# Patient Record
Sex: Male | Born: 1950 | Race: White | Hispanic: No | Marital: Married | Smoking: Former smoker
Health system: Southern US, Community
[De-identification: ages and names within clinical notes are randomized; demographics above are authoritative.]

## PROBLEM LIST (undated history)

## (undated) DIAGNOSIS — R531 Weakness: Secondary | ICD-10-CM

## (undated) DIAGNOSIS — M459 Ankylosing spondylitis of unspecified sites in spine: Secondary | ICD-10-CM

## (undated) DIAGNOSIS — I4891 Unspecified atrial fibrillation: Secondary | ICD-10-CM

## (undated) DIAGNOSIS — Z8709 Personal history of other diseases of the respiratory system: Secondary | ICD-10-CM

## (undated) DIAGNOSIS — J45909 Unspecified asthma, uncomplicated: Secondary | ICD-10-CM

## (undated) DIAGNOSIS — M2550 Pain in unspecified joint: Secondary | ICD-10-CM

## (undated) DIAGNOSIS — T783XXA Angioneurotic edema, initial encounter: Secondary | ICD-10-CM

## (undated) DIAGNOSIS — A6920 Lyme disease, unspecified: Secondary | ICD-10-CM

## (undated) DIAGNOSIS — M1990 Unspecified osteoarthritis, unspecified site: Secondary | ICD-10-CM

## (undated) DIAGNOSIS — M549 Dorsalgia, unspecified: Secondary | ICD-10-CM

## (undated) DIAGNOSIS — Z87442 Personal history of urinary calculi: Secondary | ICD-10-CM

## (undated) DIAGNOSIS — G8929 Other chronic pain: Secondary | ICD-10-CM

## (undated) DIAGNOSIS — M2540 Effusion, unspecified joint: Secondary | ICD-10-CM

## (undated) DIAGNOSIS — J189 Pneumonia, unspecified organism: Secondary | ICD-10-CM

## (undated) DIAGNOSIS — T4145XA Adverse effect of unspecified anesthetic, initial encounter: Secondary | ICD-10-CM

## (undated) DIAGNOSIS — Z8739 Personal history of other diseases of the musculoskeletal system and connective tissue: Secondary | ICD-10-CM

## (undated) DIAGNOSIS — T8859XA Other complications of anesthesia, initial encounter: Secondary | ICD-10-CM

## (undated) DIAGNOSIS — Z86010 Personal history of colonic polyps: Secondary | ICD-10-CM

## (undated) HISTORY — PX: OTHER SURGICAL HISTORY: SHX169

## (undated) HISTORY — PX: COLONOSCOPY: SHX174

## (undated) HISTORY — PX: APPENDECTOMY: SHX54

## (undated) HISTORY — PX: SHOULDER OPEN ROTATOR CUFF REPAIR: SHX2407

## (undated) HISTORY — PX: CHOLECYSTECTOMY: SHX55

## (undated) HISTORY — PX: TONSILLECTOMY: SUR1361

## (undated) HISTORY — PX: SEPTOPLASTY: SUR1290

## (undated) HISTORY — PX: ESOPHAGOGASTRODUODENOSCOPY: SHX1529

---

## 1999-04-30 ENCOUNTER — Encounter: Payer: Self-pay | Admitting: Emergency Medicine

## 1999-04-30 ENCOUNTER — Emergency Department (HOSPITAL_COMMUNITY): Admission: EM | Admit: 1999-04-30 | Discharge: 1999-04-30 | Payer: Self-pay | Admitting: Emergency Medicine

## 2000-02-25 ENCOUNTER — Encounter: Admission: RE | Admit: 2000-02-25 | Discharge: 2000-05-25 | Payer: Self-pay | Admitting: Anesthesiology

## 2000-05-02 ENCOUNTER — Emergency Department (HOSPITAL_COMMUNITY): Admission: EM | Admit: 2000-05-02 | Discharge: 2000-05-02 | Payer: Self-pay | Admitting: Emergency Medicine

## 2000-09-16 ENCOUNTER — Encounter: Payer: Self-pay | Admitting: Neurosurgery

## 2000-09-16 ENCOUNTER — Ambulatory Visit (HOSPITAL_COMMUNITY): Admission: RE | Admit: 2000-09-16 | Discharge: 2000-09-16 | Payer: Self-pay | Admitting: Neurosurgery

## 2000-10-11 ENCOUNTER — Ambulatory Visit (HOSPITAL_COMMUNITY): Admission: RE | Admit: 2000-10-11 | Discharge: 2000-10-11 | Payer: Self-pay | Admitting: Neurosurgery

## 2000-10-11 ENCOUNTER — Encounter: Payer: Self-pay | Admitting: Neurosurgery

## 2000-10-26 ENCOUNTER — Encounter: Admission: RE | Admit: 2000-10-26 | Discharge: 2000-10-26 | Payer: Self-pay | Admitting: Internal Medicine

## 2001-02-01 ENCOUNTER — Encounter (INDEPENDENT_AMBULATORY_CARE_PROVIDER_SITE_OTHER): Payer: Self-pay

## 2001-02-01 ENCOUNTER — Other Ambulatory Visit: Admission: RE | Admit: 2001-02-01 | Discharge: 2001-02-01 | Payer: Self-pay | Admitting: *Deleted

## 2002-03-19 ENCOUNTER — Encounter: Payer: Self-pay | Admitting: Emergency Medicine

## 2002-03-19 ENCOUNTER — Emergency Department (HOSPITAL_COMMUNITY): Admission: EM | Admit: 2002-03-19 | Discharge: 2002-03-19 | Payer: Self-pay | Admitting: Emergency Medicine

## 2004-08-13 ENCOUNTER — Encounter: Admission: RE | Admit: 2004-08-13 | Discharge: 2004-08-13 | Payer: Self-pay | Admitting: Ophthalmology

## 2005-09-28 ENCOUNTER — Ambulatory Visit: Payer: Self-pay | Admitting: Family Medicine

## 2006-01-14 ENCOUNTER — Ambulatory Visit: Payer: Self-pay | Admitting: Family Medicine

## 2006-02-05 ENCOUNTER — Ambulatory Visit (HOSPITAL_COMMUNITY): Admission: RE | Admit: 2006-02-05 | Discharge: 2006-02-05 | Payer: Self-pay | Admitting: Anesthesiology

## 2006-10-14 ENCOUNTER — Encounter: Admission: RE | Admit: 2006-10-14 | Discharge: 2006-10-14 | Payer: Self-pay | Admitting: Anesthesiology

## 2006-10-15 ENCOUNTER — Encounter: Admission: RE | Admit: 2006-10-15 | Discharge: 2006-10-15 | Payer: Self-pay | Admitting: Anesthesiology

## 2006-10-26 ENCOUNTER — Ambulatory Visit: Payer: Self-pay | Admitting: Internal Medicine

## 2006-10-28 ENCOUNTER — Ambulatory Visit (HOSPITAL_COMMUNITY): Admission: RE | Admit: 2006-10-28 | Discharge: 2006-10-28 | Payer: Self-pay | Admitting: Gastroenterology

## 2006-10-31 ENCOUNTER — Emergency Department (HOSPITAL_COMMUNITY): Admission: EM | Admit: 2006-10-31 | Discharge: 2006-10-31 | Payer: Self-pay | Admitting: Emergency Medicine

## 2006-11-04 ENCOUNTER — Ambulatory Visit: Payer: Self-pay | Admitting: Gastroenterology

## 2006-11-18 ENCOUNTER — Ambulatory Visit: Payer: Self-pay | Admitting: Family Medicine

## 2006-11-26 ENCOUNTER — Ambulatory Visit: Payer: Self-pay | Admitting: Internal Medicine

## 2006-11-29 ENCOUNTER — Inpatient Hospital Stay (HOSPITAL_COMMUNITY): Admission: RE | Admit: 2006-11-29 | Discharge: 2006-12-03 | Payer: Self-pay | Admitting: Surgery

## 2006-11-29 ENCOUNTER — Encounter (INDEPENDENT_AMBULATORY_CARE_PROVIDER_SITE_OTHER): Payer: Self-pay | Admitting: Specialist

## 2006-12-07 ENCOUNTER — Ambulatory Visit: Payer: Self-pay | Admitting: Gastroenterology

## 2006-12-20 ENCOUNTER — Emergency Department (HOSPITAL_COMMUNITY): Admission: EM | Admit: 2006-12-20 | Discharge: 2006-12-20 | Payer: Self-pay | Admitting: Emergency Medicine

## 2007-03-22 ENCOUNTER — Ambulatory Visit (HOSPITAL_COMMUNITY): Admission: RE | Admit: 2007-03-22 | Discharge: 2007-03-22 | Payer: Self-pay | Admitting: Anesthesiology

## 2007-07-06 IMAGING — CR DG ABDOMEN 1V
1 series · 1 of 1 positions shown · non-contrast
Comparison: none

CLINICAL DATA: Bile leak.  Evaluate for stent position.  
 ABDOMEN  - 1 VIEW:

[t abdomen supine]
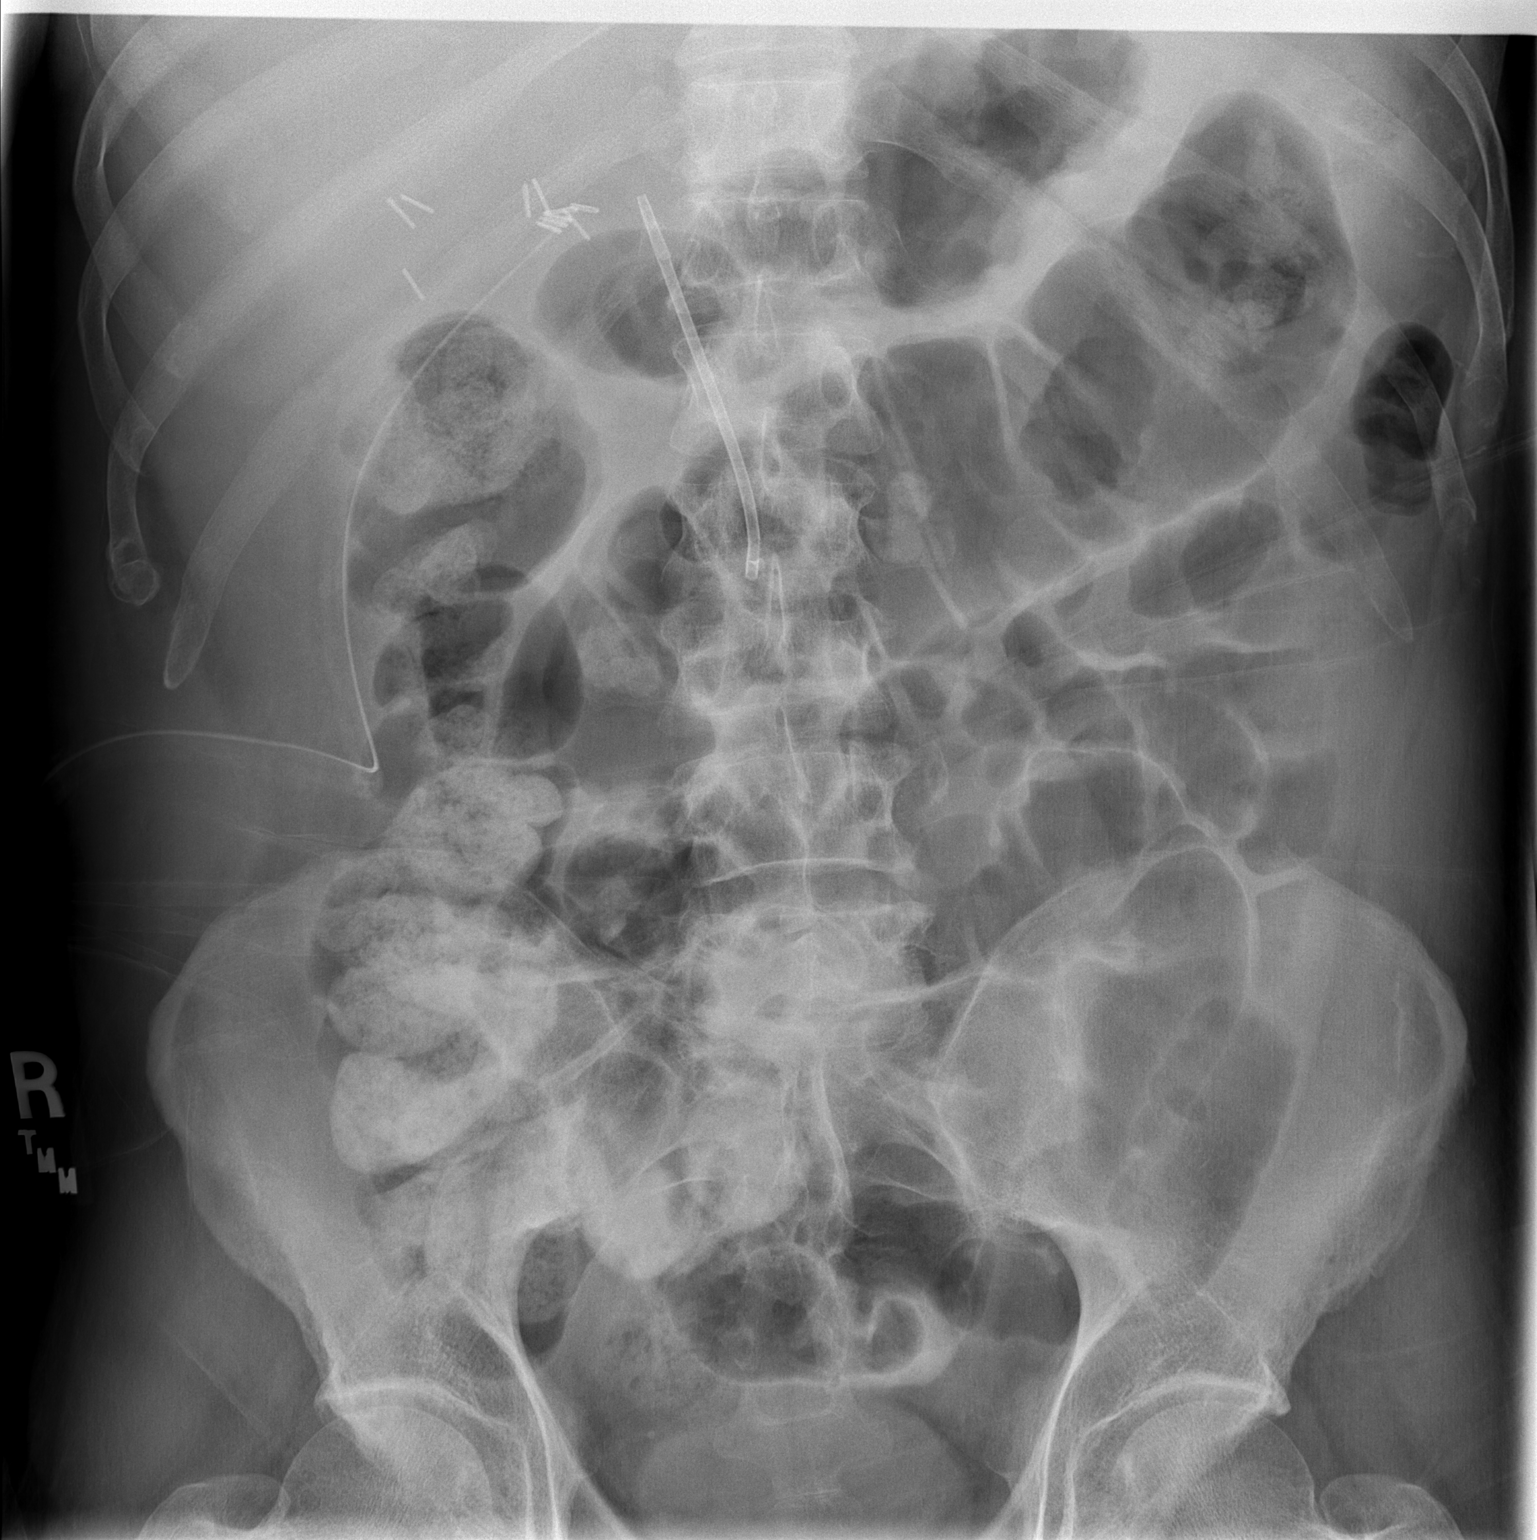

[1 of 1 positions shown; findings below may reference images not displayed]

FINDINGS: Plastic stent appears to be in good position in the common bile duct.  There is also a subhepatic drain on the right and clips present in the gallbladder fossa.  Patient has an ileus with mild dilatation of large and small bowel. 
 The findings were discussed with Dr. Ceejay at [DATE] hours on 12/02/06.
IMPRESSION: Mild ileus. 
 Common bile duct stent appears to be appropriately positioned.

## 2007-07-07 IMAGING — CR DG CHEST 1V PORT
1 series · 1 of 1 positions shown · non-contrast
Comparison: 11/26/06.

CLINICAL DATA: 56 year old male; status post cholecystectomy.  PICC line insertion.
PORTABLE CHEST -  SINGLE VIEW - 12/03/06:

[view not recorded]
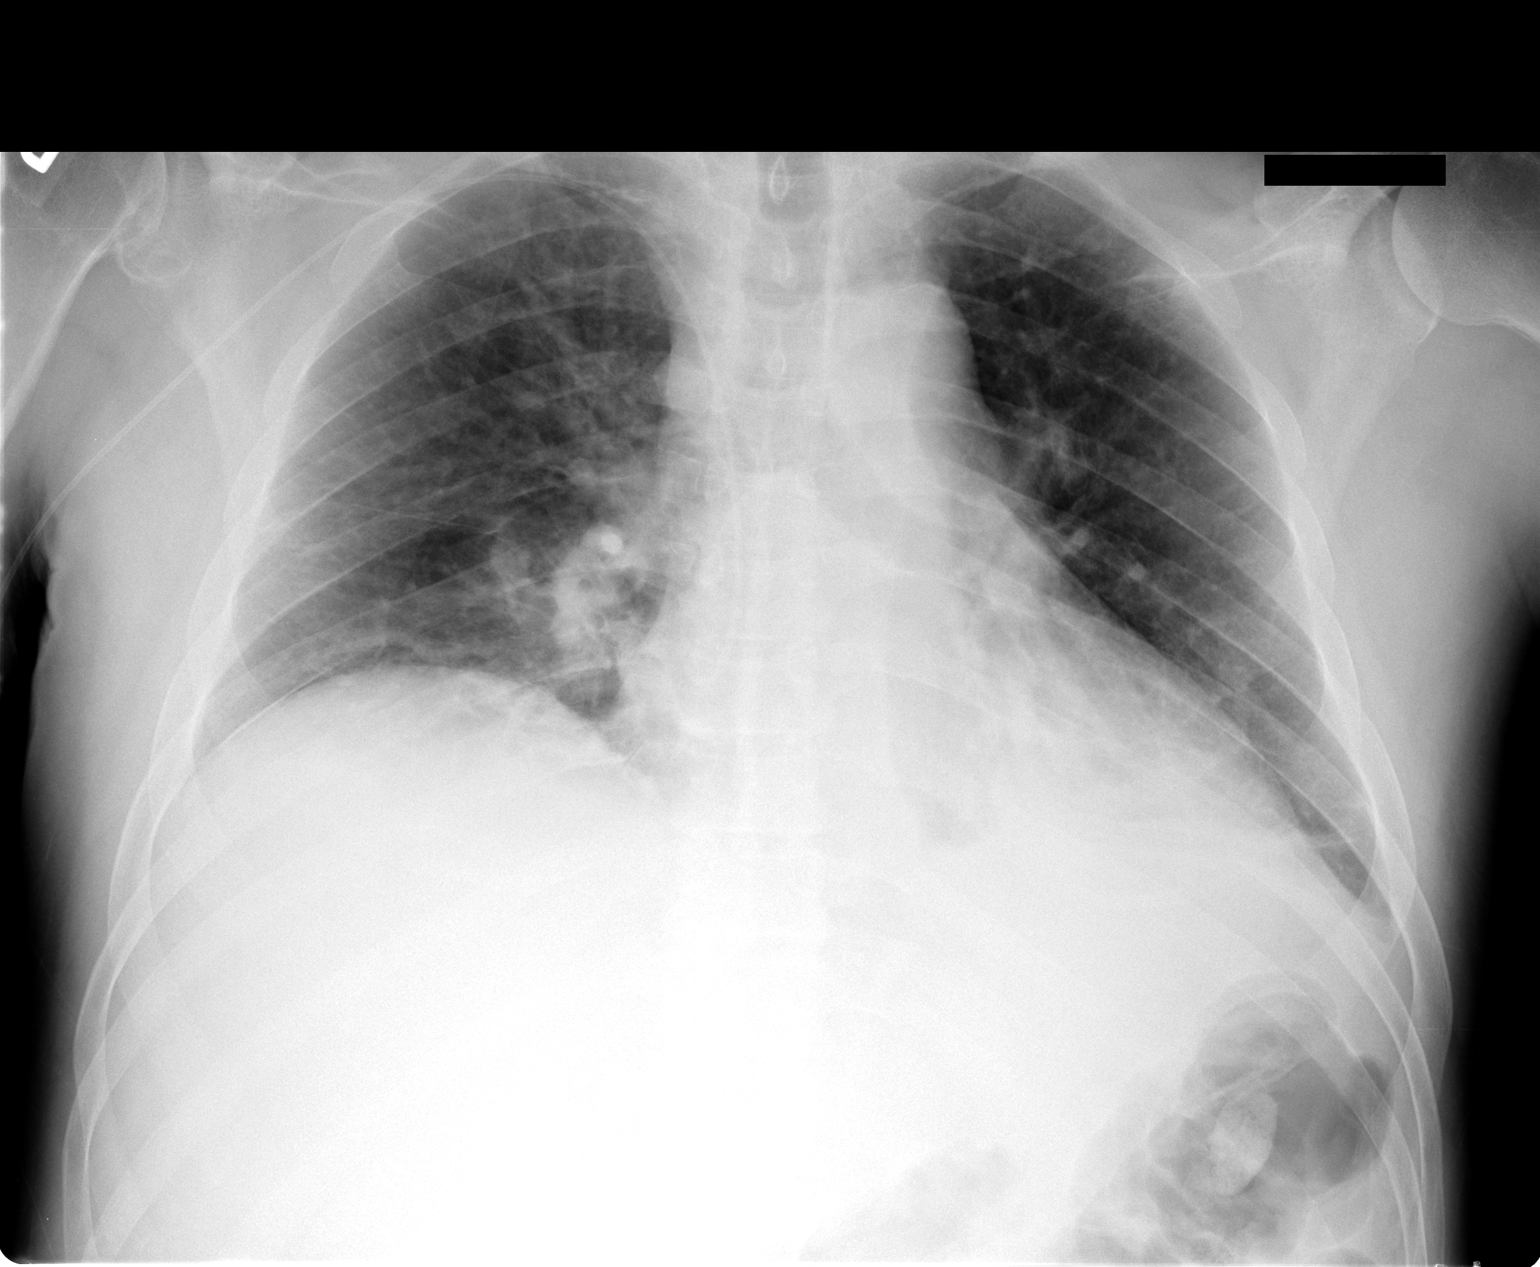

[1 of 1 positions shown; findings below may reference images not displayed]

FINDINGS: ight PICC is in the mid-RA.  This can be retracted 2 cm to the SVC-RA junction.  Low lung volumes with bibasilar atelectasis versus air space disease.  Lower lobe pneumonia is not excluded.  No large effusion or pneumothorax.
IMPRESSION: 1.  Right PICC line tip in mid-RA.  This can be retracted 2 cm.
2.  Low volume exam with bibasilar atelectasis and air space disease.

## 2007-09-10 ENCOUNTER — Ambulatory Visit: Payer: Self-pay | Admitting: Family Medicine

## 2008-03-09 ENCOUNTER — Encounter: Payer: Self-pay | Admitting: Internal Medicine

## 2008-11-01 ENCOUNTER — Encounter (INDEPENDENT_AMBULATORY_CARE_PROVIDER_SITE_OTHER): Payer: Self-pay | Admitting: Orthopedic Surgery

## 2008-11-01 ENCOUNTER — Ambulatory Visit (HOSPITAL_BASED_OUTPATIENT_CLINIC_OR_DEPARTMENT_OTHER): Admission: RE | Admit: 2008-11-01 | Discharge: 2008-11-01 | Payer: Self-pay | Admitting: Orthopedic Surgery

## 2010-08-22 ENCOUNTER — Encounter
Admission: RE | Admit: 2010-08-22 | Discharge: 2010-08-22 | Payer: Self-pay | Source: Home / Self Care | Attending: Anesthesiology | Admitting: Anesthesiology

## 2010-09-16 ENCOUNTER — Ambulatory Visit: Payer: 59 | Attending: Neurosurgery | Admitting: Rehabilitative and Restorative Service Providers"

## 2010-09-16 DIAGNOSIS — M542 Cervicalgia: Secondary | ICD-10-CM | POA: Insufficient documentation

## 2010-09-16 DIAGNOSIS — M2560 Stiffness of unspecified joint, not elsewhere classified: Secondary | ICD-10-CM | POA: Insufficient documentation

## 2010-09-16 DIAGNOSIS — M25519 Pain in unspecified shoulder: Secondary | ICD-10-CM | POA: Insufficient documentation

## 2010-09-16 DIAGNOSIS — IMO0001 Reserved for inherently not codable concepts without codable children: Secondary | ICD-10-CM | POA: Insufficient documentation

## 2010-09-22 ENCOUNTER — Ambulatory Visit: Payer: 59 | Admitting: Rehabilitative and Restorative Service Providers"

## 2010-09-24 ENCOUNTER — Ambulatory Visit: Payer: 59 | Admitting: Rehabilitative and Restorative Service Providers"

## 2010-09-24 ENCOUNTER — Ambulatory Visit: Payer: 59 | Admitting: Physical Therapy

## 2010-09-29 ENCOUNTER — Encounter: Payer: 59 | Admitting: Rehabilitative and Restorative Service Providers"

## 2010-11-20 LAB — CULTURE, ROUTINE-ABSCESS

## 2010-11-20 LAB — ANAEROBIC CULTURE

## 2010-12-23 NOTE — Op Note (Signed)
NAME:  Justin [name], Justin [name]              ACCOUNT NO.:  [id number]   MEDICAL RECORD NO.:  [id number]          PATIENT TYPE:  AMB   LOCATION:  DSC                          FACILITY:  [facility]   PHYSICIAN:  Cindee Salt, M.D.       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   POSTOPERATIVE DIAGNOSIS:  Probable chronic paronychia, left index  finger.   OPERATION:  Excision and removal of nail plate with removal of probable  sinus tract, left index finger.   SURGEON:  Cindee Salt, MD   ASSISTANT:  Joaquin Courts, RN   ANESTHESIA:  Forearm IV regional along with local supplementation with  0.25% Marcaine without epinephrine.   ANESTHESIOLOGIST:  Zenon Mayo, MD.   HISTORY:  The patient is a 60 year old male with history of a paronychia  and he has undergone incision and drainage.  This has not responded.  He  has no history of injury to it.  He is complaining of pain and  discomfort.  Examination reveals that he has swelling of the paronychial  area of his left index finger and irregularity of the radial aspect of  the nail matrix.  He is aware of risks and complications including  infection, recurrence, continuation of problems, and irregularity of the  nail bed.  He has elected to proceed to have this treated with removal  of the nail plate and incision and drainage as necessary.  In the  preoperative area, the patient is seen, the extremity marked by both the  patient and surgeon, and antibiotic was not given.   PROCEDURE IN DETAIL:  He was taken to the operating room where a forearm  based IV regional anesthetic was carried out without difficulty.  He was  prepped using ChloraPrep, supine position with his left arm free.  Time-  out was taken.  Filling was still of present on his finger.  A digital  block was given with 0.25%  Marcaine without epinephrine, approximately 6  mL was used.  Following adequate anesthesia, the nail plate was removed  with a periosteal elevator.  This allowed visualization of a  irregularity on the radial aspect of his nail fold.  This was removed  and the sinus tract was immediately removed along with the skin area  which was irregular.  This was followed back to the proximal nail fold.  This left a tract of nail plate which was adherent dorsally and  palmarly.  This was excised.  Small incision was made proximally in the  dorsal nail fold.  We were certain that there was not a collection of  purulent material proximally and cultures were taken for both aerobic  and anaerobic cultures and the area irrigated and nonadherent gauze was  placed into the nail fold.  Sterile compressive dressing and splint  applied  to the finger.  The patient tolerated the procedure well and was taken  to the recovery room for observation in satisfactory condition.  He will  be discharged to home to return to the [facility] [place] in 1  week on Vicodin and Septra DS.           ______________________________  Cindee Salt, M.D.     GK/MEDQ  D:  11/01/2008  T:  11/02/2008  Job:  [id number]   cc:   Dyke Brackett, M.D.

## 2010-12-26 NOTE — Discharge Summary (Signed)
NAME:  Justin [name], Justin [name]              ACCOUNT NO.:  [id number]   MEDICAL RECORD NO.:  [id number]          PATIENT TYPE:  INP   LOCATION:  5736                         FACILITY:  [facility]   PHYSICIAN:  Currie Paris, M.D.DATE OF BIRTH:  1951-03-16   DATE OF ADMISSION:  11/29/2006  DATE OF DISCHARGE:  12/03/2006                               DISCHARGE SUMMARY   OFFICE MEDICAL RECORD NUMBER [id number].   FINAL DIAGNOSES:  1. Chronic calculous cholecystitis.  2. Choledocholithiasis.  3. 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  4. Occluded common bile duct stent.  5. Ankylosing spondylitis.   CLINICAL HISTORY:  Justin [name] is a 60 year old gentleman who has  chronic back pain and ankylosing spondylitis on chronic OxyContin  therapy for pain management.  He has had several episodes of epigastric  and right upper quadrant pain.  His workup showed gallstones.  There is  some question of a prominent common duct on his initial ultrasound and  CT.  Preoperative evaluation and consultation with GI was also obtained.  An endoscopic ultrasound showed apparently no evidence of a common duct  stone and no ampullary mass, but a large duodenal diverticulum.  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  Because of  his gallbladder symptoms he was admitted for cholecystectomy.   HOSPITAL COURSE:  The patient was admitted and taken to the operating  room where a laparoscopic cholecystectomy and operative cholangiogram  were performed.  At the time of surgery he was noted to have a tiny duct  of Luschka which was cauterized and clipped.  We tried to sealed this  with some Flo-Seal.  In addition, his cholangiogram showed common duct  stones.  The evening after surgery he had a fair amount of pain.  It was  unclear whether this was simply related to the fact that he was already  on a lot of OxyContin and required  increased pain meds.  His JP drainage  was initially clear but then by the next morning had some bile staining.   Because of his finding of common duct stones he was seen again by GI  consultation and ERCP scheduled and performed on April 22.  A stent was  placed and at least one common duct stone was obtained.   Following the ERCP he had marked increase of his drainage of pure bile  from the Jackson-Pratt.  He had low grade fevers and elevated white  counts.  He continued to have a lot of pain and abdominal tenderness but  was able to be managed with pain medication.  By April 23 he felt a  little bit better.  We started him on some Lovenox as DVT prophylaxis.  He continued high drain output and culture showed some negative rods, so  antibiotics were continued.  Because he is continuing to leak and after  discussion with GI we obtained another CT scan.  This showed a small  collection around the area of the bed of the gallbladder but  it appeared  that the drain was in good position.  On the ERCP it did look like he  was leaking from the area of the duct of Luschka.  On April 25, because  it was clear we are not going to be able to feed him much and we thought  he was starting to get close to malnourished, we decided to place a PICC  and think about TNA.  After further discussion with GI it was felt that  stent replacement was needed.  Dr. Russella Dar believed that this would be  best done at [facility] because we had so much difficulty with the very  first  ERCP given the anatomical difficulties provided by the duodenal  diverticulum which was right in the area of the common duct.  Therefore,  on April 25 the patient was transferred to [facility].  He will be followed up at that point.      Currie Paris, M.D.  Electronically Signed     CJS/MEDQ  D:  01/05/2007  T:  01/05/2007  Job:  [id number]   cc:   Venita Lick. Russella Dar, MD, Clementeen Graham  Mark L. Vear Clock, M.D.

## 2010-12-26 NOTE — Procedures (Signed)
[facility]  Patient:    Justin [name], Justin [name]                     MRN: [id number] Proc. Date: 02/26/00 Adm. Date:  [id number] Attending:  Thyra Breed CC:         Julio Sicks, M.D.                           Procedure Report  PROCEDURE:  Lumbar epidural steroid injection.  DIAGNOSIS:  Lumbar degenerative disk disease at multiple levels on MRI and radiculopathy into the left lower extremity.  HISTORY OF PRESENT ILLNESS:  The patient states he was in his usual state of health up until about 6-8 weeks ago when he developed low back pain radiating out into his left lower extremity. It began with an injury in his garden. Initially seen by Dr. Jonny Ruiz and referred to Dr. Jordan Likes. An MRI was performed on Dec 29, 1999 which showed generalized degenerative disk disease with mild spondylosis and mild posterior bulging. He was given a steroid Dosepak and did not improve. He is sent for epidural steroid injections today. He notes that his back pain is beginning to spread over to the right side. He described it as a sharp pain. It is made worse by sitting, standing for long periods and improved by laying down. He denied any bowel or bladder incontinence but has had some left lower extremity weakness and does have numbness down the posterior aspect down the left thigh and leg. He has been treated with OxyContin which does help but he takes this on a p.r.n. basis.  CURRENT MEDICATIONS:  OxyContin 20 mg 1 p.o. b.i.d. p.r.n., Serevent and Flovent.  ALLERGIES:  Indocin.  FAMILY HISTORY:  Positive for leukemia.  PAST SURGICAL HISTORY:  Significant for left shoulder surgery and appendectomy.  SOCIAL HISTORY:  The patient is a 1 cigar smoker per day and 1 beer per day. He works for [employer], forensic as a Production designer, theatre/television/film.  ACTIVE MEDICAL PROBLEMS:  Asthma.  REVIEW OF SYSTEMS:  GENERAL:  Negative. HEAD:  Negative.  EYES:  Negative. NOSE/MOUTH/THROAT:  Negative. EAR:  Negative. PULMONARY:  Significant for asthma. CARDIOVASCULAR:  Negative.  GI: Negative. GU:  Negative.  MUSCULOSKELETAL/NEUROLOGIC:  See HPI. HEMATOLOGIC:  Negative. ENDOCRINE: Negative. CUTANEOUS:  Negative. PSYCHIATRIC:  Negative. ALLERGY/IMMUNOLOGIC:  Negative.  PHYSICAL EXAMINATION:  VITAL SIGNS:  Blood pressure 139/88, heart rate 68, respiratory rate 10, O2 saturations 98%, pain level 5/10 and temperature is 97.7.  GENERAL:  This is a pleasant male in no acute distress.  HEENT:  Head was normocephalic, atraumatic. Eyes, extraocular movements intact. Conjunctivae and sclerae clear. Nose patent nares without discharge. Oropharynx was free of lesions.  NECK:  Supple without lymphadenopathy.  LUNGS:  Clear to auscultation and percussion.  ABDOMEN:  Bowel sounds present without appreciable masses or organomegaly.  CARDIOVASCULAR:  Heart was regular rate and rhythm without murmur, gallop or rub.  GENITALIA/RECTAL:  Not performed.  BACK:  Revealed positive straight leg raise sign on the left side with no tenderness to percussion over the vertebra.  EXTREMITIES:  No cyanosis, clubbing or edema with prominent varicosities over the right lower extremity.  Radial pulses and dorsalis pedis pulse were 2+ and symmetric.  NEUROLOGIC:  The patient was oriented x 4. Cranial nerves II-XII were grossly intact. Deep tendon reflexes were symmetric in the upper and lower extremities with downgoing toes. Motor was significant for weakness of the  left dorsiflexion of the great toe and foot. Coordination was grossly intact.  IMPRESSION: 1. Low back pain with radiation out into the left lower extremity and possibly    an L5 radiculopathy. 2. Asthma.  DISPOSITION:  I discussed the potential risks, benefits and limitations of a lumbar epidural steroid injection in detail with the patient as well as the side effects of corticosteroids. He wishes to proceed with this.  DESCRIPTION OF PROCEDURE:  After informed  consent was obtained, the patient was placed in the sitting position and monitored. The patients back was prepped with Betadine x 3. A skin wheal was raised at the L4-5 interspace with 1 percent lidocaine. A 20 gauge Tuohy needle was introduced to the lumbar epidural space to loss of resistance to preservative free normal saline. There was no cerebrospinal fluid nor blood. 120 mg of Medrol and 10 ml of preservative free normal saline was gently injected. The needle was flushed with preservative free normal saline and removed intact.  CONDITION POST PROCEDURE:  Stable.  DISCHARGE INSTRUCTIONS:  Resume previous diet. Limitations in activities per instruction sheet. Continue on current medications. Follow-up with me next week for a repeat injection. DD:  02/26/00 TD:  03/01/00 Job: [id number]

## 2010-12-26 NOTE — H&P (Signed)
[facility]  Patient:    Justin [name], Justin [name]                     MRN: [id number] Proc. Date: 03/01/00 Adm. Date:  [id number] Attending:  Thyra Breed CC:         Julio Sicks, M.D.                         History and Physical  FOLLOWUP EVALUATION  Justin [name] called on Friday stating that his back was hurting him worse after the epidural steroid injection, but it was different from his previous pain.  He presents today because the hydrocodone did not help his pain.  He tells me he does not feel that his current pain is related to the injection.  It feels more like a muscular spasm in his back and the lower left side of his back is bothering him to a greater extent.  It is made worse by hyperextending his back.  He has been sleeping in a recliner.  He continues on his OxyContin which he is taking regularly now.  He rates his pain at 9/10.  PHYSICAL EXAMINATION:  VITAL SIGNS:  Blood pressure 146/97, heart rate 92, respiratory rate 18, O2 saturation 96%, pain level 9 out of 10, temperature 98.1.  NEUROLOGIC:  Straight leg raise signs are negative.  Deep tendon reflexes are unchanged from before.  He has tenderness over the left lower facet joints. Hyperextension of his back increases his discomfort.  Forward flexion reduces this discomfort.  IMPRESSION:  Exacerbation of facet joint arthritis with underlying lumbar radiculopathy and degenerative disk disease.  DISPOSITION: 1. Percocet 5/325 one to two p.o. q.6h. p.r.n. #50 with no refill. 2. Valium 5 mg one p.o. q.6h. p.r.n. #50 with no refill. 3. Remain off of work at least another day and gradually taper back on his    Valium and Percocet. 4. Followup with me in one week as previously scheduled.  He is to call in a    couple of days to let us know how he is doing, even if he is doing better.    He does not feel his current problem is related to the injection itself. DD:  03/01/00 TD:  03/02/00 Job:  [id number]

## 2010-12-26 NOTE — Procedures (Signed)
[facility]  Patient:    Justin [name], Justin [name]                     MRN: [id number] Proc. Date: 04/09/00 Adm. Date:  [id number] Attending:  Thyra Breed CC:         Julio Sicks, M.D.                           Procedure Report  PROCEDURE:  Lumbar epidural steroid injection.  DIAGNOSIS:  Lumbar radiculopathy with underlying degenerative disk disease.  INTERVAL HISTORY:  The patient has noted that his radiculopathy is markedly improved.  He delayed this injection because he had a lot of back discomfort following his last injection.  He has consistently had flare-ups of lower back discomfort with each injection which have required short courses of Valium and Percocet.  In anticipation of this, I have gone ahead and written him a prescription for Valium 5 mg 1 p.o. q.6h. p.r.n. #50 with no refill and Percocet 5/325, 1-2 p.o. q.6h. p.r.n. pain #60 with no refill.  PHYSICAL EXAMINATION:  Blood pressure 146/89, heart rate 92, respiratory rate 16, O2 saturations 98%.  Temperature is 97.1.  Pain level is 8/10.  Neuro exam is grossly unchanged.  He is in good spirits.  DESCRIPTION OF PROCEDURE:  After informed consent was obtained, the patient was placed in the sitting position and monitored.  His back was prepped with Betadine x 3.  A skin wheal was raised at the L4-L5 interspace with 1% lidocaine.  A 20 gauge Tuohy needle was introduced to the lumbar epidural space to loss of resistance to preservative-free normal saline.  There was no CSF nor blood.  Medrol 80 mg in 10 mL of preservative-free normal saline was gently injected.  The needle was flushed with preservative-free normal saline and removed intact.  Postprocedure condition - stable.  DISCHARGE INSTRUCTIONS: 1. Resume previous diet. 2. Limitations on activities per instruction sheet. 3. Medications as per discussion above. 4. Follow up with Dr. Jordan Likes, as previously arranged. DD:  04/09/00 TD:   04/11/00 Justin [name]: [id number]

## 2010-12-26 NOTE — Assessment & Plan Note (Signed)
[facility]                             PULMONARY OFFICE NOTE   NAME:Jabbour, TAVI HOOGENDOORN                     MRN:          [id number]  DATE:11/26/2006                            DOB:          03-15-1951    HISTORY:  A 61 year old white male who has not been seen here in more  than 5 years. The story is that he quit smoking 8 years ago at 240  pounds and gradually improved and then 6 months later states he began  having problems with daily symptoms of coughing and wheezing which  were better actually after sinus surgery 6 years ago, and then had  complete relief of all respiratory complaints except shortness of breath  with heavy exertion which was relieved with albuterol.  Two years ago he  had a change in the pattern of his dyspnea and began having daily  symptoms for which he was treated with Serevent and Pulmicort twice  daily and also maintained on Nexium.  However, he states that he was  never good on this regimen with frequent exacerbations and daily  symptoms again that required frequent albuterol use.  This resulted in a  trip to the emergency room at [place], and was given a course of  prednisone which he finished a week ago and now is feeling better.  He  denies any ongoing sinus or reflux symptoms, fevers, chills, sweats,  pleuritic pain, orthopnea, PND or leg swelling or need for albuterol.   PAST MEDICAL HISTORY:  Significant for sinus disease and asthma/COPD as  outlined above.  Cholelithiasis. He has chronic cholecystitis. He is scheduled for  cholecystectomy on November 29, 2006.   ALLERGIES:  None known.   MEDICATIONS:  He initially just listed Serevent and Nexium as  maintenance medicines along with p.r.n. albuterol. I had to ask him  about Pulmicort which was bothersome in that he said, oh yeah, that  too but denied any non adherence issues.   SOCIAL HISTORY:  He quit smoking as noted above 8 years ago. 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   FAMILY HISTORY:  Recorded in detail in the work sheet and negative for  respiratory disease.   REVIEW OF SYSTEMS:  Also recorded in detail on the work sheet and  negative except as outlined above.   PHYSICAL EXAMINATION:  GENERAL:  This is a pleasant ambulatory white  male in no acute distress.  VITAL SIGNS:  He is afebrile with normal vital signs.  HEENT:  Reveals mild to moderate turbinates with nonspecific features.  Oropharynx is clear. Dentition is intact. Ear canals are clear  bilaterally.  NECK:  Supple without cervical adenopathy or tenderness.  LUNGS:  Perfectly clear bilaterally to auscultation and percussion.  HEART:  Regular rhythm without murmurs, rubs, or gallops. Hoover sign  was negative.  ABDOMEN:  Soft, benign with no palpable organomegaly, masses or  tenderness.  EXTREMITIES:  Warm without calf tenderness. No clubbing, cyanosis  or  edema.   Chest x-ray was reviewed from his ER visit on October 31, 2006 and  indicated slight increased bronchial markings, and 6% eosinophils on  peripheral blood smear.   IMPRESSION:  Chronic asthma with recent acute exacerbation now back to  baseline.  I strongly suspect this patient is not adherent with  Pulmicort by the fact that he did not mention he was on it until I  extracted the information from him. This is the reason I no longer  prescribe Serevent in asthmatics (they tend to refill Serevent, because  it addresses their symptoms, but forget about the Pulmicort, which  addresses the underlying cause).   A reasonable compromise in this type patient is to switch him to  combination products and since he prefers MDI, I have recommended  Symbicort 160/4.5 2 puffs b.i.d. perfectly regularly.  Return here for  followup PFTs in 6-8 weeks to confirm that the asthmatic component has  been  eliminated and to get a firm baseline before switching to the  lower dose of Symbicort.   In the meantime I have reviewed with him the rule of two's for albuterol  use, emphasizing that albuterol is only for symptoms and serves as a  marker to how poorly controlled his asthma is, that is if he is having  to use it more than twice weekly his asthma is not under good control.   In terms of surgery, he certainly is at low risk for cholecystectomy and  I have recommended proceeding with this.     Charlaine Dalton. Sherene Sires, MD, [facility]  Electronically Signed    MBW/MedQ  DD: 11/26/2006  DT: 11/26/2006  Job #: [id number]   cc:   Arta Silence, MD

## 2010-12-26 NOTE — Assessment & Plan Note (Signed)
 [facility]ALTHCARE                         GASTROENTEROLOGY OFFICE NOTE   NAME:[name], Justin KLIPPEL                     MRN:          [id number]  DATE:10/26/2006                            DOB:          Aug 18, 1950    ADDENDUM   Dr. 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  He certainly could have another  carcinoid at the ampulla i.e., or a neuroendocrine in that region.     Iva Boop, MD,FACG     CEG/MedQ  DD: 10/26/2006  DT: 10/27/2006  Job #: [id number]   cc:   Currie Paris, M.D.  Dr. Vear Clock

## 2010-12-26 NOTE — Op Note (Signed)
NAME:  JACKOB, CROOKSTON              ACCOUNT NO.:  [id number]   MEDICAL RECORD NO.:  [id number]          PATIENT TYPE:  OIB   LOCATION:  5736                         FACILITY:  [facility]   PHYSICIAN:  Currie Paris, M.D.DATE OF BIRTH:  01-24-1951   DATE OF PROCEDURE:  11/29/2006  DATE OF DISCHARGE:                               OPERATIVE REPORT   OFFICE MEDICAL RECORD NUMBER CCS   PREOPERATIVE DIAGNOSIS:  Cholelithiasis.   POSTOPERATIVE DIAGNOSIS:  Cholelithiasis, choledocholithiasis and  congenital duct of Luschka.   OPERATION:  Laparoscopic cholecystectomy with operative cholangiogram,  placement of Blake drain.   SURGEON:  Currie Paris, M.D.   ASSISTANT:  Rose Phi. Maple Hudson, M.D.   ANESTHESIA:  General endotracheal.   CLINICAL HISTORY:  Mr. Mcdanel is a 60 year old gentleman who has  develop biliary type symptoms a with a background of chronic pain being  managed by Dr. Thyra Breed.  He takes 60 mg of Oxycontin b.i.d. for  his pain.  However, he has developed some new right upper quadrant  symptoms.  Preoperative evaluation showed what looked like a common duct  that was dilated as well as pancreatic duct dilation.  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  He was then scheduled for cholecystectomy.   DESCRIPTION OF PROCEDURE:  The patient seen in the holding area and he  had no further questions.  He is taken to the operating room, after  satisfactory general endotracheal anesthesia obtained the abdomen was  prepped and draped.  The time-out occurred.   0.25% plain Marcaine was used for each incision.  The umbilical incision  was made, the fascia identified and opened and the peritoneal cavity  entered under direct vision.  The Hasson was placed, the abdomen  insufflated to 15.   The patient in reverse Trendelenburg and tilted to the left, a 10-11  trocar was placed in the epigastrium and  two 5 mm laterally.  There no  gross abnormalities noted when the scope was placed.   The gallbladder retracted over the liver.  The peritoneum over the  cystic duct opened and I made a nice window and could identify the  cystic duct and the main cystic artery.  Lying right on the cystic duct  was also tiny branch was able to dissect off and clip and divide.  We  had a nice window so I was confident of the anatomy.  The common duct  looked dilated.   One clip was placed on the cystic artery and another of the cystic duct  near the gallbladder.  Cystic duct was opened and a Cook catheter  introduced and operative cholangiography done doing two runs.  First run  showed a dilated duct.  We could not get a good look at the distal duct  because we were centered a little bit high.  There was a possible distal  filling defect and the proximal radicals filled.  The second run was  focused lower down and we did see slow entry of the contrast into the  duodenum with what looked like a strictured duct but also looked like a  couple of stones.  I had the radiologist review these and he called back  and concurred that there appeared to be some stone debris near the end  of the common bile duct.   With that report, I went ahead and then put three clips on the cystic  duct and divided that.  The cystic arteries dissected out a little  better and clipped and divided.  The gallbladder was removed from below  to above with coagulation current of the cautery.  A small clip was  placed on what looked like a vessel along the posterior edge of the  gallbladder about a third of the way up.  Just prior to disconnecting  the gallbladder, we stopped to irrigate.  There had been a little oozing  a couple places on the gallbladder bed which were coagulated.  However,  I noted one tiny drop of the bile staining the edge of the gallbladder  bed fairly low down and more on the medial aspect.  I thought this   represented a duct of Luschka.  It was not actively spilling bile.   I was able to put two clips right on this which I think occluded it.  We  irrigated and saw no further leakage of bile.   The gallbladder was disconnected and brought out the umbilical port.  I  replaced the umbilical port and placed a 19 Blake drain and brought out  the lateral port site.  This was placed along the base of the  gallbladder bed.  I then put 5 mL of FloSeal directly over the base of  the gallbladder right where the duct of Luschka and where there had been  a little oozing to make sure everything was dry and see if this would  help seal the duct.   I suctioned out any remaining irrigant.  The abdomen was deflated. The  lateral port remaining was pulled.  The umbilical port was removed and  the pursestring tied down to close that.  With the camera in the  epigastric port made sure nothing had gotten trapped.  The abdomen was  deflated through the epigastric port.   Incisions were closed using 4-0 Monocryl plus a single zero Vicryl on  the epigastric incision fascia.   The patient tolerated procedure well.  There no operative complications.  All counts were correct.      Currie Paris, M.D.  Electronically Signed     CJS/MEDQ  D:  11/29/2006  T:  11/30/2006  Job:  [id number]   cc:   Loraine Leriche L. Vear Clock, M.D.  Rachael Fee, MD  Iva Boop, MD,FACG

## 2010-12-26 NOTE — Assessment & Plan Note (Signed)
[facility]                         GASTROENTEROLOGY OFFICE NOTE   NAME:Justin [name], Justin [name]                     MRN:          [id number]  DATE:10/26/2006                            DOB:          1951/05/08    REFERRING PHYSICIAN:  Currie Paris, M.D.   REASON FOR CONSULTATION:  Dilated bile ducts, possible ampullary lesion,  gall stones.   ASSESSMENT:  A 60 year old white male with ankylosing spondylitis who  was found to have cholelithiasis.  Ultrasound demonstrated gall stones  with dilated common duct and pancreatic ducts.  There was no definite  mass.  A prominent lobular soft tissue lesion at the ampulla is  suggested, but not confirmed.   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  He does  not clinically appear obstructed (i.e., no jaundice at this time).  I  think an endoscopic ultrasound plus/minus ERCP with possible biliary  sphincterotomy, biliary stenting, and even possible stone removal if he  had choledocholithiasis, made sense.  I have discussed the situation  with Dr. Wendall Papa, and he will perform these procedures on March 20 at  3:15 p.m.  IV Unasyn will be given on call.  I have fully explained the  nature and indication of endoscopic ultrasound and ERCP with possible  complications that include pancreatitis, perforation, need for surgery,  et Karie Soda.  All questions were answered.   HISTORY:  A 60 year old white man who has a long history of anklyosing  spondylitis.  He started having some intermittent episodic right upper  quadrant pain.  He had an ultrasound that demonstrated the gall stones  and the findings as outlined above.  I have reviewed the report and the  study.  Subsequently, he had a CT scan, he had seen Dr. Jamey Ripa, who  referred him to [place] for further evaluation.  The pain is fairly severe in  the epigastric and right upper quadrant, but mainly the right upper  quadrant.  The episodes are  causing him to double over.  He has not  had diarrhea, nausea or vomiting.  They have been a problem for about 6  months with increasing frequency.  He does not describe weight loss.  He  is describing several loose bowel movements a day since he had his CT  scan.  This is not helped by his use of Imodium.   PAST MEDICAL HISTORY:  1. Appendectomy after a lengthy workup in [place]  years ago.      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  2. Ankylosing spondylitis.  3. Shoulder surgery.  4. Middle finger surgery.   DRUG ALLERGIES:  INDOCIN.   MEDICATIONS:  OxyContin daily and Enbrel.   SOCIAL HISTORY:  He is a Pensions consultant for [employer].  No alcohol  or tobacco use.   ADDITIONAL MEDICAL HISTORY:  Includes asthma and osteoarthritis  symptoms, as well as his ankylosing spondylitis and back pain.   FAMILY HISTORY:  Father had prostate cancer.  No GI problems, colon  cancer, or liver disease.  REVIEW OF SYSTEMS:  See my medical history form for full details.  He  does believe he had a fever recently.  Otherwise, negative or as above.   PHYSICAL EXAM:  Well-developed, well-nourished middle-aged white man.  Height 5 feet 11 inches, weight 179 pounds, blood pressure 126/74, pulse  64.  EYES:  Anicteric.  HENT:  Normal mouth and posterior pharynx.  Free of mucosal lesions.  NECK:  Supple without thyromegaly or mass.  CHEST:  Clear.  HEART:  S1, S2.  No rubs, murmurs, or gallops.  ABDOMEN:  Soft without tenderness, organomegaly, or mass.  LYMPHATICS:  No neck or supraclavicular nodes.  SKIN:  On the upper part of the body shows no acute rash.  Lower  extremities appear free of edema.  NEURO/PSYCH:  He appears alert and oriented x3.   Of note, the patient has had this diarrhea that could be related to his  CT scan.  It has gone on for several days, which is a little unusual for  that.  Would not plan on working that  up at this time, given these other  issues.  Sometimes diarrhea is associated with ampullary lesions, and  perhaps even steatorrhea could ensue.  It does not sound like that at  this time (i.e., he does not describe findings of steatorrhea, but we  will keep that in mind).   I appreciate the opportunity to care for this patient.   ADDENDUM:  Dr. 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  He certainly could  have another carcinoid at the ampulla i.e., or a neuroendocrine in that  region.     Iva Boop, MD,FACG  Electronically Signed    CEG/MedQ  DD: 10/26/2006  DT: 10/27/2006  Job #: [id number]   cc:   Currie Paris, M.D.  Kathrin Penner. Vear Clock, M.D.

## 2010-12-26 NOTE — Procedures (Signed)
[facility]  Patient:    Justin [name]                      MRN: [id number] Proc. Date: 03/18/00 Adm. Date:  [id number] Attending:  Thyra Breed CC:         Julio Sicks, M.D.   Procedure Report  PROCEDURE:  Lumbar epidural steroid injection.  DIAGNOSIS:  Lumbar radiculopathy with underlying degenerative disk disease.  INTERVAL HISTORY:  The patient noted a rather pronounced accentuation in muscle spasms after his last injection. We used a fairly significant dose of 120 mg of Medrol which I suspect he did not tolerate well. He nevertheless came under control with Percocet with Valium and presents today for his repeat injection noting that the radicular discomfort is improved.  PHYSICAL EXAMINATION:  VITAL SIGNS:  Blood pressure 129/83, heart rate 73, respiratory rate 16, O2 saturations 98% and temperature is 97.3, pain level is 8/10.  BACK:  Shows good healing from his previous injection site.  NEUROLOGIC:  Grossly unchanged. He is in good spirits today.  DESCRIPTION OF PROCEDURE:  After informed consent was obtained, the patient was placed in the sitting position and monitored. The patients back was prepped with Betadine x 3. A skin wheal was raised at the L4-5 interspace with 1 percent lidocaine. A 20 gauge Tuohy needle was introduced to the lumbar epidural space to loss of resistance to preservative free normal saline. There was no cerebrospinal fluid nor blood. 80 mg of Medrol and 8 ml of preservative free normal saline was gently injected. The needle was flushed with preservative free normal saline and removed intact.  CONDITION POST PROCEDURE:  Stable.  DISCHARGE INSTRUCTIONS:  Resume previous diet. Limitations in activities per instruction sheet. The patient was given prescriptions for Percocet 5/325 1-2 p.o. q. 6h p.r.n. #50 with no refill and for Valium 5 mg 1 p.o. q. 6h p.r.n. #50 with no refill. Follow-up with me in 2 weeks for a  third injection. DD:  03/18/00 TD:  03/18/00 Job: [id number]

## 2011-11-10 DIAGNOSIS — M459 Ankylosing spondylitis of unspecified sites in spine: Secondary | ICD-10-CM | POA: Insufficient documentation

## 2011-11-10 DIAGNOSIS — M5136 Other intervertebral disc degeneration, lumbar region: Secondary | ICD-10-CM | POA: Insufficient documentation

## 2012-03-28 ENCOUNTER — Encounter (HOSPITAL_COMMUNITY): Payer: Self-pay | Admitting: General Practice

## 2012-03-28 ENCOUNTER — Inpatient Hospital Stay (HOSPITAL_COMMUNITY)
Admission: AD | Admit: 2012-03-28 | Discharge: 2012-03-30 | DRG: 916 | Disposition: A | Discharge status: Home / Self Care | Payer: 59 | Source: Ambulatory Visit | Attending: Otolaryngology | Admitting: Otolaryngology

## 2012-03-28 DIAGNOSIS — Z9089 Acquired absence of other organs: Secondary | ICD-10-CM

## 2012-03-28 DIAGNOSIS — T783XXA Angioneurotic edema, initial encounter: Principal | ICD-10-CM | POA: Diagnosis present

## 2012-03-28 DIAGNOSIS — J45909 Unspecified asthma, uncomplicated: Secondary | ICD-10-CM | POA: Diagnosis present

## 2012-03-28 DIAGNOSIS — X58XXXA Exposure to other specified factors, initial encounter: Secondary | ICD-10-CM | POA: Diagnosis present

## 2012-03-28 DIAGNOSIS — F17200 Nicotine dependence, unspecified, uncomplicated: Secondary | ICD-10-CM | POA: Diagnosis present

## 2012-03-28 DIAGNOSIS — Y998 Other external cause status: Secondary | ICD-10-CM

## 2012-03-28 DIAGNOSIS — Y92009 Unspecified place in unspecified non-institutional (private) residence as the place of occurrence of the external cause: Secondary | ICD-10-CM

## 2012-03-28 DIAGNOSIS — M069 Rheumatoid arthritis, unspecified: Secondary | ICD-10-CM | POA: Diagnosis present

## 2012-03-28 HISTORY — DX: Unspecified osteoarthritis, unspecified site: M19.90

## 2012-03-28 HISTORY — DX: Angioneurotic edema, initial encounter: T78.3XXA

## 2012-03-28 HISTORY — DX: Ankylosing spondylitis of unspecified sites in spine: M45.9

## 2012-03-28 MED ORDER — MORPHINE SULFATE 2 MG/ML IJ SOLN
2.0000 mg | INTRAMUSCULAR | Status: DC | PRN
  Administered 2012-03-28 (×2): 4 mg via INTRAVENOUS
  Administered 2012-03-29: 2 mg via INTRAVENOUS
  Administered 2012-03-29: 4 mg via INTRAVENOUS
  Administered 2012-03-29 – 2012-03-30 (×3): 2 mg via INTRAVENOUS
  Filled 2012-03-28 (×2): qty 2
  Filled 2012-03-28: qty 1
  Filled 2012-03-28: qty 2
  Filled 2012-03-28 (×3): qty 1

## 2012-03-28 MED ORDER — HYDROCODONE-ACETAMINOPHEN 7.5-500 MG/15ML PO SOLN
10.0000 mL | ORAL | Status: DC | PRN
  Administered 2012-03-29 – 2012-03-30 (×4): 15 mL via ORAL
  Filled 2012-03-28 (×5): qty 15

## 2012-03-28 MED ORDER — PROMETHAZINE HCL 25 MG PO TABS: 25.0000 mg | ORAL_TABLET | Freq: Four times a day (QID) | ORAL | Status: DC | PRN

## 2012-03-28 MED ORDER — DIPHENHYDRAMINE HCL 50 MG/ML IJ SOLN
25.0000 mg | Freq: Four times a day (QID) | INTRAMUSCULAR | Status: DC
  Administered 2012-03-28 – 2012-03-30 (×7): 25 mg via INTRAVENOUS
  Filled 2012-03-28 (×2): qty 1
  Filled 2012-03-28 (×7): qty 0.5
  Filled 2012-03-28 (×2): qty 1
  Filled 2012-03-28: qty 0.5

## 2012-03-28 MED ORDER — PROMETHAZINE HCL 25 MG RE SUPP: 25.0000 mg | Freq: Four times a day (QID) | RECTAL | Status: DC | PRN

## 2012-03-28 MED ORDER — DEXAMETHASONE SODIUM PHOSPHATE 10 MG/ML IJ SOLN
10.0000 mg | Freq: Four times a day (QID) | INTRAMUSCULAR | Status: DC
  Administered 2012-03-28 – 2012-03-30 (×7): 10 mg via INTRAVENOUS
  Filled 2012-03-28 (×12): qty 1

## 2012-03-28 MED ORDER — DEXTROSE-NACL 5-0.9 % IV SOLN
INTRAVENOUS | Status: DC
  Administered 2012-03-28: 17:00:00 via INTRAVENOUS
  Administered 2012-03-29: 1000 mL via INTRAVENOUS

## 2012-03-28 MED ORDER — SODIUM CHLORIDE 0.9 % IV SOLN
3.0000 g | Freq: Four times a day (QID) | INTRAVENOUS | Status: DC
  Administered 2012-03-28 – 2012-03-30 (×7): 3 g via INTRAVENOUS
  Filled 2012-03-28 (×9): qty 3

## 2012-03-28 NOTE — Progress Notes (Signed)
Patient ID: Justin [name], male   DOB: 05-15-1951, 61 y.o.   MRN: [id number]  No changes on exam, no stridor or respiratory distress. Continue IV meds and fluids. Add Unasyn since his immune system may not respond to infection as expected.

## 2012-03-28 NOTE — H&P (Signed)
Assessment  . Sore throat   (462) . Angioedema   (995.1) Discussed  Acute angioedema, without any known exposure to ACE inhibitor or any other known food allergy. Recommend admission to the hospital for close monitoring of the airway, and intravenous pain medicine, antihistamines, steroid, and fluid. We did discuss the possibility of tracheostomy if his airway he should start to be compromised. Reason For Visit  Justin [name] is here today at the kind request of Justin [name] for consultation and opinion for neck pain sore throat and  difficulty swallowing. HPI  Previously healthy, two-day history of intense and progressively quickly worsening severe sore throat. This seemed to start on the right side. He is having difficulty swallowing. No trouble with breathing however. No prior history of throat problems. White cell count earlier today was normal, but he doesn't really mount atypical white blood cell response with infection as was seen when he had appendicitis in the past. Allergies  Indocin CAPS. Current Meds  CeleBREX 200 MG Oral Capsule;; RPT Humira Pen 40 MG/0.8ML Subcutaneous Kit;; RPT Norco TABS;; RPT. 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 Past Med/Surg Hx  Surgical:  eg PSH   History of sinus surgery  Cholecystectomy Nose Surgery Shoulder Surgery. Family Hx  Family history of Hearing Loss. Personal Hx  Current Smoker (305.1); 2 cigars a day. ROS  Systemic: Feeling tired (fatigue)  and fever.  No night sweats  and no recent weight loss. Head: No headache. Eyes: No eye symptoms. Otolaryngeal: No hearing loss.  Earache.  No tinnitus  and no purulent nasal discharge.  No nasal passage blockage (stuffiness).  Snoring.  No sneezing  and no hoarseness.  Sore throat. Cardiovascular: No chest pain or discomfort  and no palpitations. Pulmonary: No dyspnea.  Cough.  No wheezing. Gastrointestinal: Dysphagia.  No heartburn.  No nausea, no abdominal pain,  and no melena.  No diarrhea. Genitourinary: No dysuria. Endocrine: No muscle weakness. Musculoskeletal: No calf muscle cramps, no arthralgias, and no soft tissue swelling. Neurological: No dizziness, no fainting, no tingling, and no numbness. Psychological: No anxiety  and no depression. Skin: No rash. 12 system ROS was obtained and reviewed on the Health Maintenance form dated today.  Positive responses are shown above.  If the symptom is not checked, the patient has denied it. Vital Signs   Recorded by [facility] on 28 Mar 2012 01:36 PM BP:120/70,  Height: 70 in, Weight: 190 lb, BMI: 27.3 kg/m2,  BSA Calculated: 2.04 ,  BMI Calculated: 27.26. Physical Exam  APPEARANCE: Well developed, well nourished, ill-appearing gentleman, in distress because of severe discomfort.   COMMUNICATION: Voice is slightly muffled and guarded as it is difficult for him to talk. No stridor present. HEAD & FACE:  No scars, lesions or masses of head and face.  Sinuses nontender to palpation.  Salivary glands without mass or tenderness.  Facial strength symmetric.  No facial lesion, scars, or mass. EYES: EOMI with normal primary gaze alignment. Visual acuity grossly intact.  PERRLA EXTERNAL EAR & NOSE: No scars, lesions or masses  EAC & TYMPANIC MEMBRANE: Right ear canal and drum are normal to inspection. Left ear with cerumen impaction. GROSS HEARING: Normal   TMJ:  Nontender  INTRANASAL EXAM: No polyps or purulence.  NASOPHARYNX: Normal, without lesions. LIPS, TEETH & GUMS: No lip lesions, normal dentition and normal gums. ORAL CAVITY/OROPHARYNX:  Oral mucosa moist without lesion or asymmetry of the palate, tongue, tonsil or posterior pharynx. Tonsils are not enlarged. There is edema  of the uvula. There is no tenderness or swelling of the soft palate. Indirect exam is not successful due to the swelling of the uvula and is intense pain in that area NECK:  Supple without adenopathy or mass. THYROID:   Normal with no masses palpable.  NEUROLOGIC:  No gross CN deficits. No nystagmus noted.   LYMPHATIC:  No enlarged nodes palpable. Procedure  Fiberoptic Laryngoscopy Name: Justin [name]     Age: 61 year   Performing Provider: Serena [name] The risks of the procedure are minimal and were discussed with the patient today. Pre-op Diagnosis: odynophagia  Post-op Diagnosis:Same Allergy:  reviewed allergies as listed Nasal Prep:Lidocaine/Afrin   Procedure:     With the patient seated in the exam chair, the L nasal cavity was intubated with the flexible laryngoscope.  The nasal cavity mucosa, nasopharynx, hypopharynx and larynx were all examined with findings as noted.  The scope was then removed.  The patient tolerated the procedure well without complication or blood loss (unless indicated in findings).   FINDINGS:   Nasal cavities and nasopharynx are clear. Oropharynx appears clear as well except for the edema of the uvula. Examination of the larynx reveals normal vocal cord mobility, good airway, but severe angioedema of the right arytenoid. It does not obstruct the airway. There the epiglottis itself was normal.

## 2012-03-29 LAB — CBC WITH DIFFERENTIAL/PLATELET
Basophils Relative: 0 % (ref 0–1)
Eosinophils Absolute: 0 10*3/uL (ref 0.0–0.7)
MCH: 32 pg (ref 26.0–34.0)
MCHC: 35.4 g/dL (ref 30.0–36.0)
Neutrophils Relative %: 88 % — ABNORMAL HIGH (ref 43–77)
Platelets: 226 10*3/uL (ref 150–400)
RBC: 4.75 MIL/uL (ref 4.22–5.81)

## 2012-03-29 LAB — BASIC METABOLIC PANEL
BUN: 11 mg/dL (ref 6–23)
Calcium: 10.1 mg/dL (ref 8.4–10.5)
GFR calc Af Amer: >90 mL/min (ref >90)
GFR calc non Af Amer: >90 mL/min (ref >90)
Glucose, Bld: 182 mg/dL — ABNORMAL HIGH (ref 70–99)
Sodium: 139 mEq/L (ref 135–145)

## 2012-03-29 MED ORDER — ENSURE COMPLETE PO LIQD
237.0000 mL | Freq: Two times a day (BID) | ORAL | Status: DC
  Administered 2012-03-29 – 2012-03-30 (×2): 237 mL via ORAL

## 2012-03-29 NOTE — Progress Notes (Signed)
Patient ID: Justin [name], male   DOB: 1951-06-11, 61 y.o.   MRN: [id number] Subjective: Feels slightly better, he is able to drink small amounts and is able to talk a little easier. No trouble breathing.  Objective: Vital signs in last 24 hours: Temp:  [97.6 F (36.4 C)-99.8 F (37.7 C)] 97.6 F (36.4 C) (08/20 0610) Pulse Rate:  [58-71] 58  (08/20 0610) Resp:  [16-18] 16  (08/20 0610) BP: (140-170)/(70-98) 140/76 mmHg (08/20 0610) SpO2:  [100 %] 100 % (08/20 0610) Weight:  [183 lb 11.2 oz (83.326 kg)] 183 lb 11.2 oz (83.326 kg) (08/19 1530) Weight change:  Last BM Date: 03/28/12  Intake/Output from previous day: 08/19 0701 - 08/20 0700 In: 343.3 [I.V.:243.3; IV Piggyback:100] Out: 1400 [Urine:1400] Intake/Output this shift: Total I/O In: 240 [P.O.:240] Out: 250 [Urine:250]  PHYSICAL EXAM: No neck swelling or masses. Still tender in the anterior neck but a little less than yesterday. No stridor, he is talking a little clearer today. Oral cavity is clear, difficult to stick his tongue out because of the discomfort.  Lab Results: No results found for this basename: WBC:2,HGB:2,HCT:2,PLT:2 in the last 72 hours BMET No results found for this basename: NA:2,K:2,CL:2,CO2:2,GLUCOSE:2,BUN:2,CREATININE:2,CALCIUM:2 in the last 72 hours  Studies/Results: No results found.  Medications: I have reviewed the patient's current medications.  Assessment/Plan: Slight improvement. Check labs today. Contiue IV fluids Abx, and meds.  LOS: 1 day   Yanci Bachtell 03/29/2012, 8:41 AM

## 2012-03-29 NOTE — Progress Notes (Signed)
INITIAL ADULT NUTRITION ASSESSMENT Date: 03/29/2012   Time: 2:52 PM Reason for Assessment: nutrition risk; dysphagia  ASSESSMENT: Male 61 y.o.  Dx: angioedema  Hx:  Past Medical History  Diagnosis Date  . Arthritis     "hands"  . Spondylitis, ankylosing   . Angioedema 03/28/2012    "tongue and throat"   Past Surgical History  Procedure Date  . Tonsillectomy   . Appendectomy   . Cholecystectomy   . Shoulder open rotator cuff repair     left    Related Meds:  Scheduled Meds:   . ampicillin-sulbactam (UNASYN) IV  3 g Intravenous Q6H  . dexamethasone  10 mg Intravenous Q6H  . diphenhydrAMINE  25 mg Intravenous Q6H   Continuous Infusions:   . dextrose 5 % and 0.9% NaCl 1,000 mL (03/29/12 0427)   PRN Meds:.HYDROcodone-acetaminophen, morphine injection, promethazine, promethazine   Ht: 5\' 10"  (177.8 cm)  Wt: 183 lb 11.2 oz (83.326 kg)  Ideal Wt: 75.4 kg % Ideal Wt: 110%  Usual Wt: unable to assess  Body mass index is 26.36 kg/(m^2).  Food/Nutrition Related Hx: difficulty swallowing due to sore throat PTA  Labs:  CMP     Component Value Date/Time   NA 139 03/29/2012 0920   K 4.0 03/29/2012 0920   CL 103 03/29/2012 0920   CO2 25 03/29/2012 0920   GLUCOSE 182* 03/29/2012 0920   BUN 11 03/29/2012 0920   CREATININE 0.76 03/29/2012 0920   CALCIUM 10.1 03/29/2012 0920   GFRNONAA >90 03/29/2012 0920   GFRAA >90 03/29/2012 0920    Intake: 20-25% of full liquid trays Output:   Intake/Output Summary (Last 24 hours) at 03/29/12 1454 Last data filed at 03/29/12 1350  Gross per 24 hour  Intake 823.33 ml  Output   1850 ml  Net -1026.67 ml    Diet Order: Full Liquid  Supplements/Tube Feeding: none at this time  IVF:    dextrose 5 % and 0.9% NaCl Last Rate: 1,000 mL (03/29/12 0427)    Estimated Nutritional Needs:   Kcal: 0454-0981 Protein: 75-90g Fluid: ~2.0 L/day  Pt sleeping at time of visit; snoring loudly, does not awake to voice.  Nutrition hx largely  unknown.  Per chart review, pt admitted with sore throat r/t to angioedema of R arytenoid.  Pt currently on full liquids due to pain.  NUTRITION DIAGNOSIS: -Swallowing difficulty (NI-1.1).  Status: Ongoing  RELATED TO: sore throat  AS EVIDENCE BY: angioedema of arytenoid  MONITORING/EVALUATION(Goals): 1.  Head/neck; improvement in throat pain 2.  Food/Beverage; pt to tolerate diet advancement and consume >50% of meals with resolve of throat pain  EDUCATION NEEDS: -No education needs identified at this time  INTERVENTION: 1.  Supplements; Ensure Complete BID between meals.  Dietitian #: [id number]  DOCUMENTATION CODES Per approved criteria  -Not Applicable    Loyce Dys [facility] 03/29/2012, 2:52 PM

## 2012-03-29 NOTE — Care Management Note (Signed)
    Page 1 of 1   03/29/2012     2:06:20 PM   CARE MANAGEMENT NOTE 03/29/2012  Patient:  Justin [name], Justin [name]   Account Number:  [id number]  Date Initiated:  03/29/2012  Documentation initiated by:  SUITS,TERI  Subjective/Objective Assessment:   Pt is 61 yr old admitted with severe angioedema.     Action/Plan:   Continue to follow for CM/discharge planning needs   Anticipated DC Date:  03/28/2012   Anticipated DC Plan:  HOME/SELF CARE      DC Planning Services  CM consult      Choice offered to / List presented to:             Status of service:  In process, will continue to follow Medicare Important Message given?   (If response is "NO", the following Medicare IM given date fields will be blank) Date Medicare IM given:   Date Additional Medicare IM given:    Discharge Disposition:    Per UR Regulation:  Reviewed for med. necessity/level of care/duration of stay  If discussed at Long Length of Stay Meetings, dates discussed:    Comments:

## 2012-03-30 MED ORDER — DIPHENHYDRAMINE HCL 25 MG PO CAPS: 25.0000 mg | ORAL_CAPSULE | Freq: Four times a day (QID) | ORAL | Status: DC

## 2012-03-30 MED ORDER — AMOXICILLIN-POT CLAVULANATE 875-125 MG PO TABS
1.0000 | ORAL_TABLET | Freq: Two times a day (BID) | ORAL | Status: DC
  Administered 2012-03-30: 1 via ORAL
  Filled 2012-03-30 (×2): qty 1

## 2012-03-30 MED ORDER — FAMOTIDINE 20 MG PO TABS: 20.0000 mg | ORAL_TABLET | Freq: Two times a day (BID) | ORAL | Status: DC

## 2012-03-30 MED ORDER — PREDNISONE (PAK) 10 MG PO TABS
20.0000 mg | ORAL_TABLET | Freq: Every morning | ORAL | Status: AC
  Administered 2012-03-30: 20 mg via ORAL
  Filled 2012-03-30: qty 21

## 2012-03-30 MED ORDER — AMOXICILLIN-POT CLAVULANATE 875-125 MG PO TABS: 1.0000 | ORAL_TABLET | Freq: Two times a day (BID) | ORAL | Status: DC

## 2012-03-30 MED ORDER — DIPHENHYDRAMINE HCL 25 MG PO CAPS
25.0000 mg | ORAL_CAPSULE | Freq: Four times a day (QID) | ORAL | Status: DC
  Administered 2012-03-30: 25 mg via ORAL
  Filled 2012-03-30: qty 1

## 2012-03-30 MED ORDER — FAMOTIDINE 20 MG PO TABS
20.0000 mg | ORAL_TABLET | Freq: Two times a day (BID) | ORAL | Status: DC
  Administered 2012-03-30: 20 mg via ORAL
  Filled 2012-03-30 (×2): qty 1

## 2012-03-30 MED ORDER — PREDNISONE (PAK) 10 MG PO TABS: 20.0000 mg | ORAL_TABLET | Freq: Every morning | ORAL | Status: DC

## 2012-03-30 MED ORDER — PREDNISONE (PAK) 10 MG PO TABS: 20.0000 mg | ORAL_TABLET | Freq: Every morning | ORAL | Status: AC

## 2012-03-30 MED ORDER — AMOXICILLIN-POT CLAVULANATE 875-125 MG PO TABS: 1.0000 | ORAL_TABLET | Freq: Two times a day (BID) | ORAL | Status: AC

## 2012-03-30 NOTE — Progress Notes (Signed)
Patient ID: Justin [name], male   DOB: [birth year], 61 y.o.   MRN: [id number] Subjective: Feeling about 50% better. He has been eating and drinking much better. No breathing problems.  Objective: Vital signs in last 24 hours: Temp:  [97.8 F (36.6 C)-98.7 F (37.1 C)] 97.8 F (36.6 C) (08/21 0526) Pulse Rate:  [57-94] 57  (08/21 0526) Resp:  [16-18] 17  (08/21 0526) BP: (124-142)/(67-75) 135/68 mmHg (08/21 0526) SpO2:  [94 %-100 %] 96 % (08/21 0526) Weight change:  Last BM Date: 03/29/12  Intake/Output from previous day: 08/20 0701 - 08/21 0700 In: 3337 [P.O.:720; I.V.:2417; IV Piggyback:200] Out: 1450 [Urine:1450] Intake/Output this shift: Total I/O In: -  Out: 650 [Urine:650]  PHYSICAL EXAM: No oropharyngeal swelling - uvula normal. Voice near normal. No stridor.  Lab Results:  [facility] 03/29/12 0920  WBC 9.1  HGB 15.2  HCT 42.9  PLT 226   BMET  Basename 03/29/12 0920  NA 139  K 4.0  CL 103  CO2 25  GLUCOSE 182*  BUN 11  CREATININE 0.76  CALCIUM 10.1    Studies/Results: No results found.  Medications: I have reviewed the patient's current medications.  Assessment/Plan: Progressing nicely - no breathing concerns and PO intake near normal. Will change over to PO meds and if he continues to improve, will discharge home later today.  LOS: 2 days   Marikay Roads 03/30/2012, 8:24 AM

## 2012-03-30 NOTE — Progress Notes (Signed)
Patient discharged to home with wife.  Discharge teaching done including medications and follow up care.  Verbalizes understanding with no further questions. VSS, transported by wheelchair with wife.

## 2012-03-30 NOTE — Discharge Summary (Signed)
  Physician Discharge Summary  Patient ID: Justin [name] MRN: [id number] DOB/AGE: [birth year] 61 y.o.  Admit date: 03/28/2012 Discharge date: 03/30/2012  Admission Diagnoses: Idiopathic angioedema  Discharge Diagnoses:  Active Problems:  * No active hospital problems. *    Discharged Condition: :Improved  Hospital Course: Slow resolution of severe sore throat and inability to swallow  Consults: none  Significant Diagnostic Studies: none  Treatments: IV fluids, anelgesics, steroids, antihistamines, Abx  Discharge Exam: Blood pressure 135/71, pulse 57, temperature 98.2 F (36.8 C), temperature source Oral, resp. rate 20, height 5\' 10"  (1.778 m), weight 183 lb 11.2 oz (83.326 kg), SpO2 93.00%. PHYSICAL EXAM: No stridor, voice near normal, no neck swelling  Disposition:   Discharge Orders    Future Orders Please Complete By Expires   Diet - low sodium heart healthy      Diet - low sodium heart healthy      Increase activity slowly      Increase activity slowly        Medication List  As of 03/30/2012  3:44 PM   TAKE these medications         amoxicillin-clavulanate 875-125 MG per tablet   Commonly known as: AUGMENTIN   Take 1 tablet by mouth every 12 (twelve) hours.      diphenhydrAMINE 25 mg capsule   Commonly known as: BENADRYL   Take 1 capsule (25 mg total) by mouth every 6 (six) hours.      famotidine 20 MG tablet   Commonly known as: PEPCID   Take 1 tablet (20 mg total) by mouth 2 (two) times daily.      HUMIRA 40 MG/0.8ML injection   Generic drug: adalimumab   Inject 40 mg into the skin every 14 (fourteen) days.      predniSONE 10 MG tablet   Commonly known as: STERAPRED UNI-PAK   Take 2 tablets (20 mg total) by mouth AC breakfast. To 3 tablets daily for 3 days, 2 tablets daily for 3 days, one tablet daily for 3 days, one half tablet daily for 3 days, then stop           Follow-up Information    Follow up with Serena Colonel, MD. Schedule an  appointment as soon as possible for a visit on 04/01/2012.   Contact information:   [address] [place] [address] [phone]          Signed: Serena Colonel 03/30/2012, 3:44 PM

## 2012-04-02 ENCOUNTER — Emergency Department (HOSPITAL_COMMUNITY): Payer: 59

## 2012-04-02 ENCOUNTER — Emergency Department (HOSPITAL_COMMUNITY)
Admission: EM | Admit: 2012-04-02 | Discharge: 2012-04-03 | Disposition: A | Discharge status: Home / Self Care | Payer: 59 | Attending: Emergency Medicine | Admitting: Emergency Medicine

## 2012-04-02 DIAGNOSIS — R221 Localized swelling, mass and lump, neck: Secondary | ICD-10-CM | POA: Insufficient documentation

## 2012-04-02 DIAGNOSIS — R1310 Dysphagia, unspecified: Secondary | ICD-10-CM | POA: Insufficient documentation

## 2012-04-02 DIAGNOSIS — R220 Localized swelling, mass and lump, head: Secondary | ICD-10-CM | POA: Insufficient documentation

## 2012-04-02 DIAGNOSIS — K1370 Unspecified lesions of oral mucosa: Secondary | ICD-10-CM | POA: Insufficient documentation

## 2012-04-02 DIAGNOSIS — R070 Pain in throat: Secondary | ICD-10-CM

## 2012-04-02 LAB — CBC WITH DIFFERENTIAL/PLATELET
Basophils Absolute: 0 10*3/uL (ref 0.0–0.1)
Eosinophils Relative: 0 % (ref 0–5)
HCT: 42.9 % (ref 39.0–52.0)
Lymphocytes Relative: 14 % (ref 12–46)
Lymphs Abs: 1.9 10*3/uL (ref 0.7–4.0)
MCV: 91.7 fL (ref 78.0–100.0)
Neutro Abs: 10.7 10*3/uL — ABNORMAL HIGH (ref 1.7–7.7)
Platelets: 291 10*3/uL (ref 150–400)
RBC: 4.68 MIL/uL (ref 4.22–5.81)
RDW: 12.5 % (ref 11.5–15.5)
WBC: 13.8 10*3/uL — ABNORMAL HIGH (ref 4.0–10.5)

## 2012-04-02 LAB — COMPREHENSIVE METABOLIC PANEL
ALT: 46 U/L (ref 0–53)
AST: 14 U/L (ref 0–37)
Alkaline Phosphatase: 81 U/L (ref 39–117)
CO2: 30 mEq/L (ref 19–32)
Calcium: 9.8 mg/dL (ref 8.4–10.5)
Chloride: 96 mEq/L (ref 96–112)
GFR calc Af Amer: >90 mL/min (ref >90)
GFR calc non Af Amer: 88 mL/min — ABNORMAL LOW (ref >90)
Glucose, Bld: 104 mg/dL — ABNORMAL HIGH (ref 70–99)
Sodium: 135 mEq/L (ref 135–145)
Total Bilirubin: 0.2 mg/dL — ABNORMAL LOW (ref 0.3–1.2)

## 2012-04-02 MED ORDER — HYDROMORPHONE HCL PF 1 MG/ML IJ SOLN
1.0000 mg | Freq: Once | INTRAMUSCULAR | Status: AC
  Administered 2012-04-02: 1 mg via INTRAVENOUS
  Filled 2012-04-02: qty 1

## 2012-04-02 MED ORDER — SODIUM CHLORIDE 0.9 % IV BOLUS (SEPSIS)
500.0000 mL | Freq: Once | INTRAVENOUS | Status: AC
  Administered 2012-04-02: 500 mL via INTRAVENOUS

## 2012-04-02 MED ORDER — ONDANSETRON HCL 4 MG/2ML IJ SOLN
4.0000 mg | Freq: Once | INTRAMUSCULAR | Status: AC
  Administered 2012-04-02: 4 mg via INTRAVENOUS
  Filled 2012-04-02: qty 2

## 2012-04-02 MED ORDER — IOHEXOL 300 MG/ML  SOLN: 75.0000 mL | Freq: Once | INTRAMUSCULAR | Status: AC | PRN

## 2012-04-02 NOTE — ED Notes (Signed)
Patient updated on plan of care; apologized about delay.  Patient and family member verbalized understanding.  Patient denies any changes in condition at this time; will continue to monitor.  Patient calm and cooperative; no respiratory or acute distress.  Will continue to monitor.

## 2012-04-02 NOTE — ED Provider Notes (Signed)
History     CSN: [id number]  Arrival date & time 04/02/12  1735   First MD Initiated Contact with Patient 04/02/12 2134      No chief complaint on file. CC: sore throat, trouble swallowing  (Consider location/radiation/quality/duration/timing/severity/associated sxs/prior treatment) HPI Comments: Patient presents with complaint of sore throat and trouble swallowing -- recent admission for idiopathic angioedema. Patient was hospitalized for 3 days and was discharged 3 days ago in improved condition. Patient had followup with Dr. Pollyann Kennedy yesterday and noted slight worsening of symptoms. Patient states that today he has had worsening sore throat, especially on the right side, feeling of pressure on his airway. Patient was discharged home on Benadryl, Pepcid, Augmentin, prednisone which he continues to take. He states that the pain medicine he has been taking is not helping as much. He is unable to eat solid food. He is able to drink liquids with some difficulty. Onset acute, course is gradually worsening. Nothing makes symptoms better or worse. Pain radiates into his face.   Patient is a 61 y.o. male presenting with pharyngitis. The history is provided by the patient, medical records and the spouse.  Sore Throat This is a recurrent problem. The current episode started in the past 7 days. The problem occurs constantly. The problem has been gradually worsening. Associated symptoms include neck pain and a sore throat. Pertinent negatives include no abdominal pain, chest pain, coughing, fever, headaches, myalgias, nausea, rash or vomiting. The symptoms are aggravated by swallowing. He has tried oral narcotics for the symptoms. The treatment provided mild relief.    Past Medical History  Diagnosis Date  . Arthritis     "hands"  . Spondylitis, ankylosing   . Angioedema 03/28/2012    "tongue and throat"    Past Surgical History  Procedure Date  . Tonsillectomy   . Appendectomy   . Cholecystectomy    . Shoulder open rotator cuff repair     left    No family history on file.  History  Substance Use Topics  . Smoking status: Former Smoker -- 2.0 packs/day for 30 years    Types: Cigarettes    Quit date: 08/10/2002  . Smokeless tobacco: Never Used  . Alcohol Use: 1.5 oz/week    3 drink(s) per week     03/28/2012 "2-3 mixed drinks/wk"      Review of Systems  Constitutional: Negative for fever.  HENT: Positive for sore throat, trouble swallowing, neck pain and voice change. Negative for facial swelling, rhinorrhea and neck stiffness.   Eyes: Negative for redness.  Respiratory: Negative for cough.   Cardiovascular: Negative for chest pain.  Gastrointestinal: Negative for nausea, vomiting, abdominal pain and diarrhea.  Genitourinary: Negative for dysuria.  Musculoskeletal: Negative for myalgias.  Skin: Negative for rash.  Neurological: Negative for headaches.    Allergies  Indocin  Home Medications   Current Outpatient Rx  Name Route Sig Dispense Refill  . ADALIMUMAB 40 MG/0.8ML [place] KIT Subcutaneous Inject 40 mg into the skin every 14 (fourteen) days.    . AMOXICILLIN-POT CLAVULANATE 875-125 MG PO TABS Oral Take 1 tablet by mouth every 12 (twelve) hours. 20 tablet 0  . DIPHENHYDRAMINE HCL 25 MG PO CAPS Oral Take 1 capsule (25 mg total) by mouth every 6 (six) hours. 30 capsule 0  . FAMOTIDINE 20 MG PO TABS Oral Take 1 tablet (20 mg total) by mouth 2 (two) times daily. 30 tablet 0  . IBUPROFEN 100 MG/5ML PO SUSP Oral Take 200 mg by  mouth every 4 (four) hours as needed. For pain    . LIDOCAINE VISCOUS 2 % MT SOLN Oral Take 20 mLs by mouth as needed. For pain    . OXYCODONE HCL 5 MG/5ML PO SOLN Oral Take 5 mg by mouth every 4 (four) hours as needed. For pain    . PREDNISONE (PAK) 10 MG PO TABS Oral Take 2 tablets (20 mg total) by mouth AC breakfast. To 3 tablets daily for 3 days, 2 tablets daily for 3 days, one tablet daily for 3 days, one half tablet daily for 3 days, then  stop 20 tablet 0    BP 163/96  Pulse 64  Temp 98.3 F (36.8 C) (Oral)  Resp 20  SpO2 98%  Physical Exam  Nursing note and vitals reviewed. Constitutional: He appears well-developed and well-nourished.  HENT:  Head: Normocephalic and atraumatic. No trismus in the jaw.  Right Ear: External ear normal.  Left Ear: External ear normal.  Nose: Nose normal. No mucosal edema or rhinorrhea.  Mouth/Throat: Uvula is midline. Mucous membranes are not dry. Normal dentition. No uvula swelling. Posterior oropharyngeal erythema present. No posterior oropharyngeal edema or tonsillar abscesses.    Eyes: Conjunctivae are normal. Right eye exhibits no discharge. Left eye exhibits no discharge.  Neck: Neck supple. No tracheal deviation present.       Tender to external palpation, R>L.   Cardiovascular: Normal rate, regular rhythm and normal heart sounds.   Pulmonary/Chest: Effort normal and breath sounds normal. No stridor. No respiratory distress. He has no wheezes.       No stridor  Abdominal: Soft. There is no tenderness.  Lymphadenopathy:    He has no cervical adenopathy.  Neurological: He is alert.  Skin: Skin is warm and dry.  Psychiatric: He has a normal mood and affect.    ED Course  Procedures (including critical care time)  Labs Reviewed  CBC WITH DIFFERENTIAL - Abnormal; Notable for the following:    WBC 13.8 (*)     Neutrophils Relative 78 (*)     Neutro Abs 10.7 (*)     Monocytes Absolute 1.1 (*)     All other components within normal limits  COMPREHENSIVE METABOLIC PANEL - Abnormal; Notable for the following:    Glucose, Bld 104 (*)     Total Bilirubin 0.2 (*)     GFR calc non Af Amer 88 (*)     All other components within normal limits   Dg Chest 2 View  04/02/2012  *RADIOLOGY REPORT*  Clinical Data: 61 year old male with shortness of breath. Hoarseness.  CHEST - 2 VIEW  Comparison: 12/03/2006.  Findings: Better lung volumes.  Cardiac size and mediastinal contours are  within normal limits.  Visualized tracheal air column is within normal limits.  No pneumothorax or pulmonary edema.  No pleural effusion.  Linear and streaky bibasilar opacity most resembles atelectasis.  No consolidation. No acute osseous abnormality identified.  Right upper quadrant surgical clips.  IMPRESSION: Mild bibasilar atelectasis.   Original Report Authenticated By: Harley Hallmark, M.D.    Ct Soft Tissue Neck W Contrast  04/03/2012  *RADIOLOGY REPORT*  Clinical Data: 61 year old male with the right neck swelling. History of angio edema.  Pain and difficulty swallowing.  CT NECK WITH CONTRAST  Technique:  Multidetector CT imaging of the neck was performed with intravenous contrast.  Contrast:  75 ml Omnipaque-300.  Comparison: Cervical spine MRI of 08/22/2010.  Findings: Area of clinical concern marked on the scan at  the level of the right submandibular gland.  The skin marker is anterior to the right external jugular vein near the right sternocleidomastoid muscle which appears normal.  No lymphadenopathy on the right; right level III nodes do appear to be increased in number but are small and most have demonstrable fatty hilum.  Both submandibular gland appears somewhat hypertrophied, and portions of the right submandibular gland are indistinct, but there is no associated hyperenhancement or surrounding inflammatory stranding.  No asymmetric thickening of the platysma.  No sialolithiasis.  Sublingual space, parapharyngeal spaces and retropharyngeal space are within normal limits.  Retained secretions in the hypopharynx. Mild asymmetry of the right tonsillar pillar.  Dental streak artifact.  No discrete tonsillar mass.  Mild thickening of the epiglottis and aryepiglottic folds bilaterally.  Larynx otherwise within normal limits.  Negative thyroid.  Negative visualized superior mediastinum.  Major vascular structures in the neck are patent.  Parotid glands are within normal limits.  Negative visualized  brain parenchyma and orbits.  Scattered mild to moderate paranasal sinus mucosal thickening. Previous maxillary antrostomies. No acute osseous abnormality identified.  Cervical facet degeneration on the left.  Emphysema in the lung apices.  Dependent opacity greater on the left compatible with atelectasis.  IMPRESSION: 1.  No mass, lymphadenopathy, or acute finding to correspond to the palpable area of concern. 2. Mildly asymmetric appearance of the pharynx and epiglottis and some retained secretions.  No discrete mass or hyperenhancement. The appearance might relate to the stated history of angio edema. 3.  Similar indistinctness of the right submandibular gland, but without strong evidence of acute sialoadenitis.  No sialolithiasis identified.   Original Report Authenticated By: Harley Hallmark, M.D.      1. Throat pain     9:50 PM Patient seen and examined. Seen with Dr. Effie Shy. Work-up reviewed. Medications ordered.   Vital signs reviewed and are as follows: Filed Vitals:   04/02/12 1738  BP: 163/96  Pulse: 64  Temp: 98.3 F (36.8 C)  Resp: 20   Spoke with Dr. Jenne Pane and reviewed exam. Recc: CT neck to rule out deep space infection, abscess.   CT reviewed by myself. No findings of deep space infection or airway compromise.   Re-paged Dr. Jenne Pane and reviewed findings. Patient safe for d/c. Patient and wife informed of results.   He will continue home oxycodone and ibuprofen. Patient urged to return if he has worsening swelling, increasing work of breathing, or any other concerns.  Patient urged to followup with Dr. Pollyann Kennedy as planned on Monday. Urged patient to continue antibiotics and steroids. Patient verbalizes understanding and agrees with plan.   MDM  Patients with throat pain, recent hospitalization for angioedema. Patient  has been in emergency department for greater than 6 hours. He maintains a patent airway. He is handling his secretions. CT scan did not indicate signs of  infection or impending airway compromise. No stridor. Patient is afebrile. Will discharge patient to home with strict return instructions. Patient has ENT followup in less than 48 hours.      Renne Crigler, [place] 04/03/12 [phone]

## 2012-04-02 NOTE — ED Notes (Addendum)
Here last mon - wed. For throat swelling. Since d/c it has become worse. Denies sob. Trouble swallowing saliva. Swollen lymph nodes. 10 mg oxycodone and 400 mg ibuprofen. Called Dr. Jenne Pane on call and told to come in here.

## 2012-04-04 NOTE — ED Provider Notes (Signed)
Medical screening examination/treatment/procedure(s) were performed by non-physician practitioner and as supervising physician I was immediately available for consultation/collaboration.  Flint Melter, MD 04/04/12 [phone]

## 2012-12-27 ENCOUNTER — Encounter (HOSPITAL_COMMUNITY): Payer: Self-pay | Admitting: Pharmacy Technician

## 2013-01-03 ENCOUNTER — Ambulatory Visit (HOSPITAL_COMMUNITY)
Admission: RE | Admit: 2013-01-03 | Discharge: 2013-01-03 | Disposition: A | Discharge status: Home / Self Care | Payer: BC Managed Care – PPO | Source: Ambulatory Visit | Attending: Surgical | Admitting: Surgical

## 2013-01-03 ENCOUNTER — Encounter (HOSPITAL_COMMUNITY)
Admission: RE | Admit: 2013-01-03 | Discharge: 2013-01-03 | Disposition: A | Discharge status: Home / Self Care | Payer: BC Managed Care – PPO | Source: Ambulatory Visit | Attending: Orthopedic Surgery | Admitting: Orthopedic Surgery

## 2013-01-03 ENCOUNTER — Encounter (HOSPITAL_COMMUNITY): Payer: Self-pay

## 2013-01-03 DIAGNOSIS — Z01812 Encounter for preprocedural laboratory examination: Secondary | ICD-10-CM | POA: Insufficient documentation

## 2013-01-03 DIAGNOSIS — Z01818 Encounter for other preprocedural examination: Secondary | ICD-10-CM | POA: Insufficient documentation

## 2013-01-03 DIAGNOSIS — M51379 Other intervertebral disc degeneration, lumbosacral region without mention of lumbar back pain or lower extremity pain: Secondary | ICD-10-CM | POA: Insufficient documentation

## 2013-01-03 DIAGNOSIS — Z0183 Encounter for blood typing: Secondary | ICD-10-CM | POA: Insufficient documentation

## 2013-01-03 DIAGNOSIS — M4180 Other forms of scoliosis, site unspecified: Secondary | ICD-10-CM | POA: Insufficient documentation

## 2013-01-03 DIAGNOSIS — Z01810 Encounter for preprocedural cardiovascular examination: Secondary | ICD-10-CM | POA: Insufficient documentation

## 2013-01-03 DIAGNOSIS — M5137 Other intervertebral disc degeneration, lumbosacral region: Secondary | ICD-10-CM | POA: Insufficient documentation

## 2013-01-03 HISTORY — DX: Unspecified asthma, uncomplicated: J45.909

## 2013-01-03 LAB — COMPREHENSIVE METABOLIC PANEL
ALT: 26 U/L (ref 0–53)
AST: 21 U/L (ref 0–37)
Albumin: 4 g/dL (ref 3.5–5.2)
Alkaline Phosphatase: 68 U/L (ref 39–117)
BUN: 25 mg/dL — ABNORMAL HIGH (ref 6–23)
CO2: 25 mEq/L (ref 19–32)
Calcium: 9.8 mg/dL (ref 8.4–10.5)
Chloride: 104 mEq/L (ref 96–112)
Creatinine, Ser: 0.88 mg/dL (ref 0.50–1.35)
GFR calc Af Amer: >90 mL/min (ref >90)
GFR calc non Af Amer: >90 mL/min (ref >90)
Glucose, Bld: 104 mg/dL — ABNORMAL HIGH (ref 70–99)
Potassium: 4.4 mEq/L (ref 3.5–5.1)
Sodium: 139 mEq/L (ref 135–145)
Total Bilirubin: 0.3 mg/dL (ref 0.3–1.2)
Total Protein: 7.6 g/dL (ref 6.0–8.3)

## 2013-01-03 LAB — URINALYSIS, ROUTINE W REFLEX MICROSCOPIC
Glucose, UA: NEGATIVE mg/dL
Hgb urine dipstick: NEGATIVE
Ketones, ur: NEGATIVE mg/dL
Leukocytes, UA: NEGATIVE
Nitrite: NEGATIVE
Protein, ur: NEGATIVE mg/dL
Specific Gravity, Urine: 1.037 — ABNORMAL HIGH (ref 1.005–1.030)
Urobilinogen, UA: 0.2 mg/dL (ref 0.0–1.0)
pH: 6.5 (ref 5.0–8.0)

## 2013-01-03 LAB — CBC
HCT: 41.8 % (ref 39.0–52.0)
Platelets: 206 10*3/uL (ref 150–400)
RBC: 4.62 MIL/uL (ref 4.22–5.81)
RDW: 13.1 % (ref 11.5–15.5)
WBC: 5.4 10*3/uL (ref 4.0–10.5)

## 2013-01-03 LAB — PROTIME-INR
INR: 0.83 (ref 0.00–1.49)
Prothrombin Time: 11.4 seconds — ABNORMAL LOW (ref 11.6–15.2)

## 2013-01-03 LAB — APTT: aPTT: 32 seconds (ref 24–37)

## 2013-01-03 NOTE — Patient Instructions (Addendum)
20 Justin [name]  01/03/2013   Your procedure is scheduled on:6-4   -2014  Report to [facility] at       0630 AM.  Call this number if you have problems the morning of surgery: [phone]  Or Presurgical Testing [phone]([name])      Do not eat food:After Midnight.    Take these medicines the morning of surgery with A SIP OF WATER:    Do not wear jewelry, make-up or nail polish.  Do not wear lotions, powders, or perfumes. You may wear deodorant.  Do not shave 12 hours prior to first CHG shower(legs and under arms).(face and neck okay.)  Do not bring valuables to the hospital.  Contacts, dentures or bridgework,body piercing,  may not be worn into surgery.  Leave suitcase in the car. After surgery it may be brought to your room.  For patients admitted to the hospital, checkout time is 11:00 AM the day of discharge.   Patients discharged the day of surgery will not be allowed to drive home. Must have responsible person with you x 24 hours once discharged.  Name and phone number of your driver:   Special Instructions: CHG(Chlorhedine 4%-"Hibiclens","Betasept","Aplicare") Shower Use Special Wash: see special instructions.(avoid face and genitals)   Please read over the following fact sheets that you were given: MRSA Information, Blood Transfusion fact sheet, Incentive Spirometry Instruction.    Failure to follow these instructions may result in Cancellation of your surgery.   Patient signature_______________________________________________________

## 2013-01-03 NOTE — Pre-Procedure Instructions (Addendum)
01-03-13 CXR 8'13-Epic. EKG, Lumbar Spine XR today. 01-05-13 1000 Pt. Notified of Positive Staph aureus PCR screen- will use Mupirocin ointment as directed.

## 2013-01-11 ENCOUNTER — Encounter (HOSPITAL_COMMUNITY): Payer: Self-pay | Admitting: Certified Registered Nurse Anesthetist

## 2013-01-11 ENCOUNTER — Encounter (HOSPITAL_COMMUNITY): Payer: Self-pay | Admitting: *Deleted

## 2013-01-11 ENCOUNTER — Ambulatory Visit (HOSPITAL_COMMUNITY): Payer: BC Managed Care – PPO | Admitting: Certified Registered Nurse Anesthetist

## 2013-01-11 ENCOUNTER — Ambulatory Visit (HOSPITAL_COMMUNITY): Payer: BC Managed Care – PPO

## 2013-01-11 ENCOUNTER — Observation Stay (HOSPITAL_COMMUNITY)
Admission: RE | Admit: 2013-01-11 | Discharge: 2013-01-12 | Disposition: A | Discharge status: Home / Self Care | Payer: BC Managed Care – PPO | Source: Ambulatory Visit | Attending: Orthopedic Surgery | Admitting: Orthopedic Surgery

## 2013-01-11 ENCOUNTER — Ambulatory Visit: Admit: 2013-01-11 | Payer: Self-pay | Admitting: Orthopedic Surgery

## 2013-01-11 ENCOUNTER — Encounter (HOSPITAL_COMMUNITY)
Admission: RE | Disposition: A | Discharge status: Home / Self Care | Payer: Self-pay | Source: Ambulatory Visit | Attending: Orthopedic Surgery

## 2013-01-11 DIAGNOSIS — R29898 Other symptoms and signs involving the musculoskeletal system: Secondary | ICD-10-CM | POA: Insufficient documentation

## 2013-01-11 DIAGNOSIS — M4804 Spinal stenosis, thoracic region: Secondary | ICD-10-CM

## 2013-01-11 DIAGNOSIS — M216X9 Other acquired deformities of unspecified foot: Secondary | ICD-10-CM | POA: Insufficient documentation

## 2013-01-11 DIAGNOSIS — M5126 Other intervertebral disc displacement, lumbar region: Principal | ICD-10-CM | POA: Insufficient documentation

## 2013-01-11 HISTORY — PX: LUMBAR LAMINECTOMY/DECOMPRESSION MICRODISCECTOMY: SHX5026

## 2013-01-11 LAB — ABO/RH: ABO/RH(D): A NEG

## 2013-01-11 LAB — TYPE AND SCREEN: Antibody Screen: NEGATIVE

## 2013-01-11 SURGERY — LUMBAR LAMINECTOMY/DECOMPRESSION MICRODISCECTOMY 1 LEVEL
Anesthesia: General | Site: Back | Laterality: Right

## 2013-01-11 SURGERY — LUMBAR LAMINECTOMY/DECOMPRESSION MICRODISCECTOMY
Anesthesia: General | Site: Back | Laterality: Right | Wound class: Clean

## 2013-01-11 MED ORDER — PROMETHAZINE HCL 25 MG/ML IJ SOLN: 6.2500 mg | INTRAMUSCULAR | Status: DC | PRN

## 2013-01-11 MED ORDER — POLYETHYLENE GLYCOL 3350 17 G PO PACK: 17.0000 g | PACK | Freq: Every day | ORAL | Status: DC | PRN

## 2013-01-11 MED ORDER — DEXTROSE 5 % IV SOLN
500.0000 mg | Freq: Four times a day (QID) | INTRAVENOUS | Status: DC | PRN
  Administered 2013-01-11: 500 mg via INTRAVENOUS
  Filled 2013-01-11: qty 5

## 2013-01-11 MED ORDER — HYDROMORPHONE HCL PF 1 MG/ML IJ SOLN
0.5000 mg | INTRAMUSCULAR | Status: DC | PRN
  Administered 2013-01-11 – 2013-01-12 (×6): 1 mg via INTRAVENOUS
  Filled 2013-01-11 (×7): qty 1

## 2013-01-11 MED ORDER — FENTANYL CITRATE 0.05 MG/ML IJ SOLN
INTRAMUSCULAR | Status: DC | PRN
  Administered 2013-01-11 (×2): 50 ug via INTRAVENOUS
  Administered 2013-01-11: 150 ug via INTRAVENOUS
  Administered 2013-01-11: 50 ug via INTRAVENOUS
  Administered 2013-01-11: 100 ug via INTRAVENOUS
  Administered 2013-01-11: 50 ug via INTRAVENOUS

## 2013-01-11 MED ORDER — ONDANSETRON HCL 4 MG/2ML IJ SOLN: 4.0000 mg | INTRAMUSCULAR | Status: DC | PRN

## 2013-01-11 MED ORDER — PHENYLEPHRINE HCL 10 MG/ML IJ SOLN
INTRAMUSCULAR | Status: DC | PRN
  Administered 2013-01-11: 60 ug via INTRAVENOUS

## 2013-01-11 MED ORDER — ROCURONIUM BROMIDE 100 MG/10ML IV SOLN
INTRAVENOUS | Status: DC | PRN
  Administered 2013-01-11: 10 mg via INTRAVENOUS
  Administered 2013-01-11: 20 mg via INTRAVENOUS
  Administered 2013-01-11: 50 mg via INTRAVENOUS

## 2013-01-11 MED ORDER — BUPIVACAINE LIPOSOME 1.3 % IJ SUSP
20.0000 mL | Freq: Once | INTRAMUSCULAR | Status: DC
  Filled 2013-01-11: qty 20

## 2013-01-11 MED ORDER — OXYCODONE HCL 5 MG PO TABS
5.0000 mg | ORAL_TABLET | ORAL | Status: DC | PRN
  Administered 2013-01-11 – 2013-01-12 (×5): 5 mg via ORAL
  Filled 2013-01-11 (×5): qty 1

## 2013-01-11 MED ORDER — HYDROMORPHONE HCL PF 1 MG/ML IJ SOLN
INTRAMUSCULAR | Status: AC
  Administered 2013-01-11: 1 mg via INTRAVENOUS
  Filled 2013-01-11: qty 2

## 2013-01-11 MED ORDER — THROMBIN 5000 UNITS EX SOLR
CUTANEOUS | Status: AC
  Filled 2013-01-11: qty 10000

## 2013-01-11 MED ORDER — BUPIVACAINE LIPOSOME 1.3 % IJ SUSP
INTRAMUSCULAR | Status: DC | PRN
  Administered 2013-01-11: 20 mL

## 2013-01-11 MED ORDER — CEFAZOLIN SODIUM-DEXTROSE 2-3 GM-% IV SOLR
INTRAVENOUS | Status: AC
  Filled 2013-01-11: qty 50

## 2013-01-11 MED ORDER — SUCCINYLCHOLINE CHLORIDE 20 MG/ML IJ SOLN
INTRAMUSCULAR | Status: DC | PRN
  Administered 2013-01-11: 100 mg via INTRAVENOUS

## 2013-01-11 MED ORDER — HYDROMORPHONE HCL PF 1 MG/ML IJ SOLN
INTRAMUSCULAR | Status: DC | PRN
  Administered 2013-01-11 (×2): 1 mg via INTRAVENOUS

## 2013-01-11 MED ORDER — THROMBIN 5000 UNITS EX SOLR
CUTANEOUS | Status: DC | PRN
  Administered 2013-01-11: 10000 [IU] via TOPICAL

## 2013-01-11 MED ORDER — ONDANSETRON HCL 4 MG/2ML IJ SOLN
INTRAMUSCULAR | Status: DC | PRN
  Administered 2013-01-11: 4 mg via INTRAVENOUS

## 2013-01-11 MED ORDER — SODIUM CHLORIDE 0.9 % IR SOLN
Status: DC | PRN
  Administered 2013-01-11: 09:00:00

## 2013-01-11 MED ORDER — BISACODYL 10 MG RE SUPP: 10.0000 mg | Freq: Every day | RECTAL | Status: DC | PRN

## 2013-01-11 MED ORDER — BACITRACIN-NEOMYCIN-POLYMYXIN 400-5-5000 EX OINT
TOPICAL_OINTMENT | CUTANEOUS | Status: DC | PRN
  Administered 2013-01-11: 1 via TOPICAL

## 2013-01-11 MED ORDER — KETOROLAC TROMETHAMINE 30 MG/ML IJ SOLN
INTRAMUSCULAR | Status: AC
  Filled 2013-01-11: qty 1

## 2013-01-11 MED ORDER — HYDROMORPHONE HCL PF 1 MG/ML IJ SOLN
0.2500 mg | INTRAMUSCULAR | Status: DC | PRN
  Administered 2013-01-11 (×4): 0.5 mg via INTRAVENOUS

## 2013-01-11 MED ORDER — PHENOL 1.4 % MT LIQD: 1.0000 | OROMUCOSAL | Status: DC | PRN

## 2013-01-11 MED ORDER — CEFAZOLIN SODIUM 1-5 GM-% IV SOLN
1.0000 g | Freq: Three times a day (TID) | INTRAVENOUS | Status: AC
  Administered 2013-01-11 – 2013-01-12 (×3): 1 g via INTRAVENOUS
  Filled 2013-01-11 (×3): qty 50

## 2013-01-11 MED ORDER — FLEET ENEMA 7-19 GM/118ML RE ENEM: 1.0000 | ENEMA | Freq: Once | RECTAL | Status: AC | PRN

## 2013-01-11 MED ORDER — ACETAMINOPHEN 325 MG PO TABS: 650.0000 mg | ORAL_TABLET | ORAL | Status: DC | PRN

## 2013-01-11 MED ORDER — GLYCOPYRROLATE 0.2 MG/ML IJ SOLN
INTRAMUSCULAR | Status: DC | PRN
  Administered 2013-01-11: 0.4 mg via INTRAVENOUS

## 2013-01-11 MED ORDER — LIDOCAINE HCL (CARDIAC) 20 MG/ML IV SOLN
INTRAVENOUS | Status: DC | PRN
  Administered 2013-01-11: 50 mg via INTRAVENOUS

## 2013-01-11 MED ORDER — OXYCODONE-ACETAMINOPHEN 5-325 MG PO TABS
1.0000 | ORAL_TABLET | ORAL | Status: DC | PRN
  Administered 2013-01-11 – 2013-01-12 (×5): 1 via ORAL
  Filled 2013-01-11 (×5): qty 1

## 2013-01-11 MED ORDER — LACTATED RINGERS IV SOLN
INTRAVENOUS | Status: DC
  Administered 2013-01-11 (×3): via INTRAVENOUS

## 2013-01-11 MED ORDER — PROPOFOL 10 MG/ML IV BOLUS
INTRAVENOUS | Status: DC | PRN
  Administered 2013-01-11: 200 mg via INTRAVENOUS

## 2013-01-11 MED ORDER — BACITRACIN-NEOMYCIN-POLYMYXIN 400-5-5000 EX OINT
TOPICAL_OINTMENT | CUTANEOUS | Status: AC
  Filled 2013-01-11: qty 1

## 2013-01-11 MED ORDER — NEOSTIGMINE METHYLSULFATE 1 MG/ML IJ SOLN
INTRAMUSCULAR | Status: DC | PRN
  Administered 2013-01-11: 3 mg via INTRAVENOUS

## 2013-01-11 MED ORDER — MENTHOL 3 MG MT LOZG: 1.0000 | LOZENGE | OROMUCOSAL | Status: DC | PRN

## 2013-01-11 MED ORDER — ACETAMINOPHEN 650 MG RE SUPP: 650.0000 mg | RECTAL | Status: DC | PRN

## 2013-01-11 MED ORDER — OXYCODONE-ACETAMINOPHEN 10-325 MG PO TABS: 1.0000 | ORAL_TABLET | ORAL | Status: DC | PRN

## 2013-01-11 MED ORDER — GLYCOPYRROLATE 0.2 MG/ML IJ SOLN
INTRAMUSCULAR | Status: DC | PRN
  Administered 2013-01-11: 0.2 mg via INTRAVENOUS

## 2013-01-11 MED ORDER — MIDAZOLAM HCL 5 MG/5ML IJ SOLN
INTRAMUSCULAR | Status: DC | PRN
  Administered 2013-01-11: 2 mg via INTRAVENOUS

## 2013-01-11 MED ORDER — HYDROCODONE-ACETAMINOPHEN 5-325 MG PO TABS: 1.0000 | ORAL_TABLET | ORAL | Status: DC | PRN

## 2013-01-11 MED ORDER — KETOROLAC TROMETHAMINE 30 MG/ML IJ SOLN
15.0000 mg | Freq: Once | INTRAMUSCULAR | Status: AC | PRN
  Administered 2013-01-11: 30 mg via INTRAVENOUS

## 2013-01-11 MED ORDER — CEFAZOLIN SODIUM-DEXTROSE 2-3 GM-% IV SOLR
2.0000 g | INTRAVENOUS | Status: AC
  Administered 2013-01-11: 2 g via INTRAVENOUS

## 2013-01-11 MED ORDER — LACTATED RINGERS IV SOLN
INTRAVENOUS | Status: DC
  Administered 2013-01-11: 100 mL/h via INTRAVENOUS
  Administered 2013-01-11: 21:00:00 via INTRAVENOUS

## 2013-01-11 MED ORDER — METHOCARBAMOL 500 MG PO TABS
500.0000 mg | ORAL_TABLET | Freq: Four times a day (QID) | ORAL | Status: DC | PRN
  Administered 2013-01-11 – 2013-01-12 (×2): 500 mg via ORAL
  Filled 2013-01-11 (×2): qty 1

## 2013-01-11 SURGICAL SUPPLY — 47 items
APL SKNCLS STERI-STRIP NONHPOA (GAUZE/BANDAGES/DRESSINGS) ×1
BAG SPEC THK2 15X12 ZIP CLS (MISCELLANEOUS) ×1
BAG ZIPLOCK 12X15 (MISCELLANEOUS) ×2 IMPLANT
BENZOIN TINCTURE PRP APPL 2/3 (GAUZE/BANDAGES/DRESSINGS) ×2 IMPLANT
CLEANER TIP ELECTROSURG 2X2 (MISCELLANEOUS) ×2 IMPLANT
CLOTH BEACON ORANGE TIMEOUT ST (SAFETY) ×2 IMPLANT
CONT SPECI 4OZ STER CLIK (MISCELLANEOUS) ×2 IMPLANT
DRAIN PENROSE 18X1/4 LTX STRL (WOUND CARE) IMPLANT
DRAPE LG THREE QUARTER DISP (DRAPES) ×5 IMPLANT
DRAPE MICROSCOPE LEICA (MISCELLANEOUS) ×2 IMPLANT
DRAPE POUCH INSTRU U-SHP 10X18 (DRAPES) ×2 IMPLANT
DRAPE SURG 17X11 SM STRL (DRAPES) ×2 IMPLANT
DRSG ADAPTIC 3X8 NADH LF (GAUZE/BANDAGES/DRESSINGS) ×2 IMPLANT
DRSG PAD ABDOMINAL 8X10 ST (GAUZE/BANDAGES/DRESSINGS) ×2 IMPLANT
DURAPREP 26ML APPLICATOR (WOUND CARE) ×2 IMPLANT
ELECT BLADE TIP CTD 4 INCH (ELECTRODE) ×2 IMPLANT
ELECT REM PT RETURN 9FT ADLT (ELECTROSURGICAL) ×2
ELECTRODE REM PT RTRN 9FT ADLT (ELECTROSURGICAL) ×1 IMPLANT
GLOVE BIOGEL PI IND STRL 8 (GLOVE) ×2 IMPLANT
GLOVE BIOGEL PI INDICATOR 8 (GLOVE) ×2
GLOVE ECLIPSE 8.0 STRL XLNG CF (GLOVE) ×4 IMPLANT
GOWN PREVENTION PLUS LG XLONG (DISPOSABLE) ×5 IMPLANT
GOWN STRL REIN XL XLG (GOWN DISPOSABLE) ×4 IMPLANT
KIT BASIN OR (CUSTOM PROCEDURE TRAY) ×2 IMPLANT
KIT POSITIONING SURG ANDREWS (MISCELLANEOUS) ×2 IMPLANT
MANIFOLD NEPTUNE II (INSTRUMENTS) ×2 IMPLANT
NDL SPNL 18GX3.5 QUINCKE PK (NEEDLE) ×2 IMPLANT
NEEDLE SPNL 18GX3.5 QUINCKE PK (NEEDLE) ×4 IMPLANT
NS IRRIG 1000ML POUR BTL (IV SOLUTION) ×2 IMPLANT
PATTIES SURGICAL .5 X.5 (GAUZE/BANDAGES/DRESSINGS) ×1 IMPLANT
PATTIES SURGICAL .75X.75 (GAUZE/BANDAGES/DRESSINGS) IMPLANT
PATTIES SURGICAL 1X1 (DISPOSABLE) ×3 IMPLANT
PIN SAFETY NICK PLATE  2 MED (MISCELLANEOUS)
PIN SAFETY NICK PLATE 2 MED (MISCELLANEOUS) IMPLANT
POSITIONER SURGICAL ARM (MISCELLANEOUS) ×2 IMPLANT
SPONGE GAUZE 4X4 12PLY (GAUZE/BANDAGES/DRESSINGS) ×1 IMPLANT
SPONGE LAP 4X18 X RAY DECT (DISPOSABLE) ×4 IMPLANT
SPONGE SURGIFOAM ABS GEL 100 (HEMOSTASIS) ×2 IMPLANT
STAPLER VISISTAT 35W (STAPLE) IMPLANT
SUT VIC AB 0 CT1 27 (SUTURE) ×2
SUT VIC AB 0 CT1 27XBRD ANTBC (SUTURE) ×1 IMPLANT
SUT VIC AB 1 CT1 27 (SUTURE) ×8
SUT VIC AB 1 CT1 27XBRD ANTBC (SUTURE) ×4 IMPLANT
TAPE CLOTH SURG 6X10 WHT LF (GAUZE/BANDAGES/DRESSINGS) ×1 IMPLANT
TOWEL OR 17X26 10 PK STRL BLUE (TOWEL DISPOSABLE) ×4 IMPLANT
TRAY LAMINECTOMY (CUSTOM PROCEDURE TRAY) ×2 IMPLANT
WATER STERILE IRR 1500ML POUR (IV SOLUTION) ×2 IMPLANT

## 2013-01-11 NOTE — Interval H&P Note (Signed)
History and Physical Interval Note:  01/11/2013 8:34 AM  [name]  has presented today for surgery, with the diagnosis of HERNIATED DISC  The various methods of treatment have been discussed with the patient and family. After consideration of risks, benefits and other options for treatment, the patient has consented to  Procedure(s): CENTRAL DECOMPRESSION LUMBAR LAMINECTOMYL4-L5 RIGHT, MICRODISCECTOMY L4-L5 RIGHT     (1 LEVEL) (Right) as a surgical intervention .  The patient's history has been reviewed, patient examined, no change in status, stable for surgery.  I have reviewed the patient's chart and labs.  Questions were answered to the patient's satisfaction.     Lannis Lichtenwalner A

## 2013-01-11 NOTE — Anesthesia Preprocedure Evaluation (Signed)
Anesthesia Evaluation  Patient identified by MRN, date of birth, ID band Patient awake    Reviewed: Allergy & Precautions, H&P , NPO status , Patient's Chart, lab work & pertinent test results  Airway Mallampati: II TM Distance: >3 FB Neck ROM: Full    Dental no notable dental hx.    Pulmonary former smoker,  Smoking Status: Former Smoker - 60 pack years    Quit Smoking: 08/10/02   breath sounds clear to auscultation  Pulmonary exam normal       Cardiovascular negative cardio ROS  Rhythm:Regular Rate:Normal     Neuro/Psych negative neurological ROS  negative psych ROS   GI/Hepatic negative GI ROS, Neg liver ROS,   Endo/Other  negative endocrine ROS  Renal/GU negative Renal ROS  negative genitourinary   Musculoskeletal negative musculoskeletal ROS (+)   Abdominal   Peds negative pediatric ROS (+)  Hematology negative hematology ROS (+)   Anesthesia Other Findings   Reproductive/Obstetrics negative OB ROS                           Anesthesia Physical Anesthesia Plan  ASA: II  Anesthesia Plan: General   Post-op Pain Management:    Induction: Intravenous  Airway Management Planned: Oral ETT  Additional Equipment:   Intra-op Plan:   Post-operative Plan: Extubation in OR  Informed Consent: I have reviewed the patients History and Physical, chart, labs and discussed the procedure including the risks, benefits and alternatives for the proposed anesthesia with the patient or authorized representative who has indicated his/her understanding and acceptance.   Dental advisory given  Plan Discussed with: CRNA and Surgeon  Anesthesia Plan Comments:         Anesthesia Quick Evaluation

## 2013-01-11 NOTE — H&P (Signed)
Justin [name] is an 62 y.o. male.   Chief Complaint: Pain and weakness in Right Leg. HPI: Larey Seat and injured Low Back.  Past Medical History  Diagnosis Date  . Spondylitis, ankylosing   . Angioedema 03/28/2012    "tongue and throat"- was never known why"was taking humira at the time.  . Asthma     no acute episodes in many yrs-no inhalers used or other meds now.  . Arthritis     "hands""back"    Past Surgical History  Procedure Laterality Date  . Tonsillectomy    . Appendectomy    . Cholecystectomy    . Shoulder open rotator cuff repair      left    History reviewed. No pertinent family history. Social History:  reports that he quit smoking about 10 years ago. His smoking use included Cigarettes. He has a 60 pack-year smoking history. He has never used smokeless tobacco. He reports that he drinks about 1.5 ounces of alcohol per week. He reports that he does not use illicit drugs.  Allergies:  Allergies  Allergen Reactions  . Indocin (Indomethacin) Other (See Comments)    Severe headaches.     Medications Prior to Admission  Medication Sig Dispense Refill  . oxyCODONE-acetaminophen (PERCOCET) 10-325 MG per tablet Take 1 tablet by mouth every 4 (four) hours as needed for pain.      Marland Kitchen ibuprofen (ADVIL,MOTRIN) 200 MG tablet Take 200 mg by mouth every 6 (six) hours as needed for pain.      . Multiple Vitamin (MULTIVITAMIN WITH MINERALS) TABS Take 1 tablet by mouth daily.        Results for orders placed during the hospital encounter of 01/11/13 (from the past 48 hour(s))  TYPE AND SCREEN     Status: None   Collection Time    01/11/13  7:10 AM      Result Value Range   ABO/RH(D) A NEG     Antibody Screen PENDING     Sample Expiration 01/14/2013     No results found.  Review of Systems  Constitutional: Negative.   HENT: Negative.   Eyes: Negative.   Cardiovascular: Negative.   Gastrointestinal: Negative.   Genitourinary: Negative.   Musculoskeletal: Positive for  back pain.  Psychiatric/Behavioral: Negative.     Blood pressure 139/81, pulse 79, temperature 98.7 F (37.1 C), temperature source Oral, resp. rate 18, SpO2 96.00%. Physical Exam  Constitutional: He appears well-developed.  HENT:  Head: Normocephalic.  Eyes: Pupils are equal, round, and reactive to light.  Neck: Normal range of motion.  Cardiovascular: Normal rate.   Respiratory: Effort normal.  GI: Soft.  Musculoskeletal: Normal range of motion.  Weakness of Right Foot Dorsiflexors and Positive straight Leg raising.  Neurological: He is alert.  Skin: Skin is warm.     Assessment/Plan Central Decompression and Microdiscectomy L-4-L-5 on the Right/  Harper Vandervoort A 01/11/2013, 8:27 AM

## 2013-01-11 NOTE — Anesthesia Postprocedure Evaluation (Signed)
  Anesthesia Post-op Note  Patient: Justin [name]  Procedure(s) Performed: Procedure(s) (LRB): CENTRAL DECOMPRESSION LUMBAR LAMINECTOMYL4-L5 RIGHT, MICRODISCECTOMY L4-L5 RIGHT     (1 LEVEL) (Right)  Patient Location: PACU  Anesthesia Type: General  Level of Consciousness: awake and alert   Airway and Oxygen Therapy: Patient Spontanous Breathing  Post-op Pain: mild  Post-op Assessment: Post-op Vital signs reviewed, Patient's Cardiovascular Status Stable, Respiratory Function Stable, Patent Airway and No signs of Nausea or vomiting  Last Vitals:  Filed Vitals:   01/11/13 0637  BP: 139/81  Pulse: 79  Temp: 37.1 C  Resp: 18    Post-op Vital Signs: stable   Complications: No apparent anesthesia complications

## 2013-01-11 NOTE — Brief Op Note (Signed)
01/11/2013  10:57 AM  PATIENT:  Justin [name]  62 y.o. 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  PROCEDURE:  Complete Decompressive Lumbar Laminectomy at L-4-L-5 and L-5-S-1 for Spinal Stenosis and foraminal Stenosis at TWO Levels involving the L-4 and L-5 Roots.Microdiscectomy L-4 L-5 on the right.  SURGEON:  Surgeon(s) and Role:    * Jacki Cones, MD - Primary    ASSISTANTS:James Aplington MD  ANESTHESIA:   general  EBL:  Total I/O In: 1000 [I.V.:1000] Out: -   BLOOD ADMINISTERED:none  DRAINS: none   LOCAL MEDICATIONS USED:  BUPIVICAINE 20cc   SPECIMEN:  Source of Specimen:  L-4 -L-5 Disc Space  DISPOSITION OF SPECIMEN:  PATHOLOGY  COUNTS:  YES  TOURNIQUET:  * No tourniquets in log *  DICTATION: .Other Dictation: Dictation Number [id number]  PLAN OF CARE: Admit for overnight observation  PATIENT DISPOSITION:  PACU - hemodynamically stable.   Delay start of Pharmacological VTE agent (>24hrs) due to surgical blood loss or risk of bleeding: yes

## 2013-01-11 NOTE — Transfer of Care (Signed)
Immediate Anesthesia Transfer of Care Note  Patient: Justin [name]  Procedure(s) Performed: Procedure(s): CENTRAL DECOMPRESSION LUMBAR LAMINECTOMYL4-L5 RIGHT, MICRODISCECTOMY L4-L5 RIGHT     (1 LEVEL) (Right)  Patient Location: PACU  Anesthesia Type:General  Level of Consciousness: awake and alert   Airway & Oxygen Therapy: Patient Spontanous Breathing and Patient connected to face mask oxygen  Post-op Assessment: Report given to PACU RN and Post -op Vital signs reviewed and stable  Post vital signs: Reviewed and stable  Complications: No apparent anesthesia complications

## 2013-01-12 ENCOUNTER — Encounter (HOSPITAL_COMMUNITY): Payer: Self-pay | Admitting: Orthopedic Surgery

## 2013-01-12 MED ORDER — OXYCODONE-ACETAMINOPHEN 10-325 MG PO TABS: 1.0000 | ORAL_TABLET | ORAL | Status: DC | PRN

## 2013-01-12 MED ORDER — METHOCARBAMOL 500 MG PO TABS: 500.0000 mg | ORAL_TABLET | Freq: Four times a day (QID) | ORAL | Status: DC

## 2013-01-12 NOTE — Discharge Summary (Signed)
Physician Discharge Summary  Patient ID: MALEKO GREULICH MRN: [id number] DOB/AGE: [birth year] 62 y.o.  Admit date: 01/11/2013 Discharge date: 01/12/2013  Admission Diagnoses:Spinal Stenosis and Herniated Lumbar Disc  At L-4-L-5  Discharge Diagnoses:Spinal Stenosis and Herniated Lumbar Disc at L-4-L-5  Active Problems:   * No active hospital problems. *   Discharged Condition: good  Hospital Course: No post-op Complications.  Consults: None  Significant Diagnostic Studies: Xrays in Or for Localization  Treatments: antibiotics: Ancef  Discharge Exam: Blood pressure 139/72, pulse 61, temperature 98.1 F (36.7 C), temperature source Oral, resp. rate 16, height 5\' 11"  (1.803 m), weight 83.462 kg (184 lb), SpO2 98.00%. Extremities: extremities normal, atraumatic, no cyanosis or edema  Disposition: 01-Home or Self Care  Discharge Orders   Future Orders Complete By Expires     Call MD / Call 911  As directed     Comments:      If you experience chest pain or shortness of breath, CALL 911 and be transported to the hospital emergency room.  If you develope a fever above 101 F, pus (white drainage) or increased drainage or redness at the wound, or calf pain, call your surgeon's office.    Discharge instructions  As directed     Comments:      Change your dressing daily. Shower only, no tub bath. Call if any temperatures greater than 101 or any wound complications: [phone] during the day and ask for Dr. Jeannetta Ellis nurse, Mackey [facility]ch[place].    Driving restrictions  As directed     Comments:      No driving for one week    Increase activity slowly as tolerated  As directed         Medication List    STOP taking these medications       ibuprofen 200 MG tablet  Commonly known as:  ADVIL,MOTRIN      TAKE these medications       methocarbamol 500 MG tablet  Commonly known as:  ROBAXIN  Take 1 tablet (500 mg total) by mouth 4 (four) times daily.     multivitamin with minerals Tabs   Take 1 tablet by mouth daily.     oxyCODONE-acetaminophen 10-325 MG per tablet  Commonly known as:  PERCOCET  Take 1 tablet by mouth every 4 (four) hours as needed for pain.         Signed: Jamara Vary A 01/12/2013, 9:11 AM

## 2013-01-12 NOTE — Evaluation (Signed)
Occupational Therapy Evaluation Patient Details Name: Justin [name] MRN: [id number] DOB: 11/15/1950 Today's Date: 01/12/2013 Time: 7829-5621 OT Time Calculation (min): 21 min  OT Assessment / Plan / Recommendation Clinical Impression  Pt demos decline in function with ADLs and ADL mobility safety following central decompression lumbar laminectomy L4-5 right and microdiscectomy L4-5 right.  Pt would benefit from acute OT services to address impairments to help restore PLOF to return home safely    OT Assessment  Patient needs continued OT Services    Follow Up Recommendations  Home health OT    Barriers to Discharge None    Equipment Recommendations   tub bench, ADL A/E kit   Recommendations for Other Services    Frequency       Precautions / Restrictions Precautions Precautions: Back Precaution Comments: Pt able to recall 2/3 back precautions, reviewed all ack preacuations with pt and handouts provided by PT Restrictions Weight Bearing Restrictions: No   Pertinent Vitals/Pain 6/10 back/surgical area    ADL  Grooming: Performed;Wash/dry hands;Wash/dry face;Brushing hair;Min guard Where Assessed - Grooming: Supported standing Upper Body Bathing: Simulated;Supervision/safety;Set up Lower Body Bathing: Simulated;Moderate assistance Upper Body Dressing: Performed;Supervision/safety;Set up Lower Body Dressing: Performed;Moderate assistance Toilet Transfer: Performed;Min Pension scheme manager Method: Sit to Barista: Regular height toilet;Grab bars Toileting - Clothing Manipulation and Hygiene: Minimal assistance Where Assessed - Engineer, mining and Hygiene: Standing Equipment Used: Long-handled shoe horn;Long-handled sponge;Reacher;Rolling walker;Gait belt;Sock aid Transfers/Ambulation Related to ADLs: cues for safe technique and correct hand placement with RW ADL Comments: Pt provided with education and demo of ADL A/E, pt educated on  use of tub bench. Pt stated that he will take sponge baths until he feel bettter to step into tub shower    OT Diagnosis: Acute pain  OT Problem List: Decreased knowledge of use of DME or AE;Decreased activity tolerance;Impaired balance (sitting and/or standing);Pain;Decreased knowledge of precautions OT Treatment Interventions:     OT Goals Acute Rehab OT Goals OT Goal Formulation: With patient Time For Goal Achievement: 01/19/13 Potential to Achieve Goals: Good ADL Goals Pt Will Perform Grooming: with set-up;with supervision;Standing at sink ADL Goal: Grooming - Progress: Goal set today Pt Will Perform Lower Body Bathing: with min assist;with adaptive equipment ADL Goal: Lower Body Bathing - Progress: Goal set today Pt Will Perform Lower Body Dressing: with min assist;with adaptive equipment ADL Goal: Lower Body Dressing - Progress: Goal set today Pt Will Transfer to Toilet: with supervision;Grab bars;with DME ADL Goal: Toilet Transfer - Progress: Goal set today Pt Will Perform Toileting - Clothing Manipulation: with supervision;Standing ADL Goal: Toileting - Clothing Manipulation - Progress: Goal set today Pt Will Perform Toileting - Hygiene: with supervision ADL Goal: Toileting - Hygiene - Progress: Goal set today  Visit Information  Last OT Received On: 01/12/13 Assistance Needed: +1    Subjective Data  Subjective: " I hope to leave today, can You help me get dressed ? " Patient Stated Goal: To return home   Prior Functioning     Home Living Lives With: Alone Available Help at Discharge: Friend(s) Type of Home: House Home Access: Stairs to enter [employer] of Steps: 3-4 Entrance Stairs-Rails: Right Home Layout: One level Bathroom Shower/Tub: Engineer, manufacturing systems: Standard Home Adaptive Equipment: Straight cane Prior Function Level of Independence: Independent with assistive device(s) Able to Take Stairs?: Yes Driving: Yes Vocation: Full  time employment Communication Communication: No difficulties Dominant Hand: Right         Vision/Perception Vision -  History Baseline Vision: Wears glasses only for reading Patient Visual Report: No change from baseline Perception Perception: Within Functional Limits   Cognition  Cognition Arousal/Alertness: Awake/alert Behavior During Therapy: WFL for tasks assessed/performed Overall Cognitive Status: Within Functional Limits for tasks assessed    Extremity/Trunk Assessment Right Upper Extremity Assessment RUE ROM/Strength/Tone: WFL for tasks assessed RUE Sensation: WFL - Light Touch RUE Coordination: WFL - gross/fine motor Left Upper Extremity Assessment LUE ROM/Strength/Tone: WFL for tasks assessed LUE Sensation: WFL - Light Touch LUE Coordination: WFL - gross/fine motor Right Lower Extremity Assessment RLE ROM/Strength/Tone: Deficits RLE ROM/Strength/Tone Deficits: grossly 3/5 throughout, difficulty completing full range however able to do so RLE Sensation: Deficits RLE Sensation Deficits: reports dull sensaion in R LE compared to L as well as numbness and tingling constant (vs occasional prior to surgery) down lateral R LE RLE Coordination: WFL - gross/fine motor Left Lower Extremity Assessment LLE ROM/Strength/Tone: WFL for tasks assessed LLE Sensation: WFL - Light Touch LLE Coordination: WFL - gross/fine motor Trunk Assessment Trunk Assessment: Normal     Mobility Bed Mobility Bed Mobility: Rolling Right;Right Sidelying to Sit Rolling Right: 5: Supervision Right Sidelying to Sit: 5: Supervision Details for Bed Mobility Assistance: increased time and effort, HOB slightly raised Transfers Sit to Stand: 4: Min guard;With upper extremity assist;From bed;From chair/3-in-1;From toilet Stand to Sit: 4: Min guard;With upper extremity assist;To chair/3-in-1;To bed;To toilet Details for Transfer Assistance: verbal cues for safe technique     Exercise     Balance  Balance Balance Assessed: No   End of Session OT - End of Session Equipment Utilized During Treatment: Gait belt;Other (comment) (RW, ADL A/E) Activity Tolerance: Patient tolerated treatment well Patient left: in bed;with call bell/phone within reach  GO Functional Limitation: Self care Self Care Current Status (W0981): At least 40 percent but less than 60 percent impaired, limited or restricted Self Care Goal Status (X9147): At least 20 percent but less than 40 percent impaired, limited or restricted   Galen Manila 01/12/2013, 10:59 AM

## 2013-01-12 NOTE — Evaluation (Signed)
Physical Therapy Evaluation Patient Details Name: Justin [name] MRN: [id number] DOB: 07-28-1951 Today's Date: 01/12/2013 Time: 9562-1308 PT Time Calculation (min): 14 min  PT Assessment / Plan / Recommendation Clinical Impression  Pt is a 62 year old male s/p central decompression lumbar laminectomy L4-5 right and microdiscectomy L4-5 right.  Pt would benefit from acute PT services in order to improve transfers, ambulation and stairs while maintaining back precautions and increase strength of R LE.  Pt educated on back precautions and provided handout.  Pt states his friend Rosanne Ashing will be picking him up upon d/c and assisting him at home.    PT Assessment  Patient needs continued PT services    Follow Up Recommendations  Home health PT    Does the patient have the potential to tolerate intense rehabilitation      Barriers to Discharge        Equipment Recommendations  Rolling walker with 5" wheels    Recommendations for Other Services     Frequency Min 5X/week    Precautions / Restrictions Precautions Precautions: Back Restrictions Weight Bearing Restrictions: No   Pertinent Vitals/Pain Pt reports back pain (not rated), repositioned, ice pack provided, RN states already medicated     Mobility  Bed Mobility Bed Mobility: Rolling Right;Right Sidelying to Sit Rolling Right: 5: Supervision Right Sidelying to Sit: 5: Supervision Details for Bed Mobility Assistance: increased time and effort, HOB slightly raised Transfers Transfers: Sit to Stand;Stand to Sit Sit to Stand: 4: Min guard;With upper extremity assist;From bed Stand to Sit: 4: Min guard;With upper extremity assist;To chair/3-in-1 Details for Transfer Assistance: verbal cues for safe technique Ambulation/Gait Ambulation/Gait Assistance: 4: Min guard Ambulation Distance (Feet): 80 Feet Assistive device: Rolling walker Ambulation/Gait Assistance Details: pt reports difficulty taking weight through R LE (also  present before surgery), verbal cue for technique and safe use of RW Gait Pattern: Step-to pattern;Step-through pattern;Decreased stride length Gait velocity: decreased    Exercises     PT Diagnosis: Difficulty walking;Acute pain  PT Problem List: Decreased strength;Decreased activity tolerance;Decreased mobility;Impaired sensation;Decreased knowledge of precautions;Decreased knowledge of use of DME;Pain PT Treatment Interventions: DME instruction;Gait training;Stair training;Functional mobility training;Therapeutic activities;Therapeutic exercise;Patient/family education   PT Goals Acute Rehab PT Goals PT Goal Formulation: With patient Time For Goal Achievement: 01/19/13 Potential to Achieve Goals: Good Pt will go Sit to Stand: with modified independence PT Goal: Sit to Stand - Progress: Goal set today Pt will go Stand to Sit: with modified independence PT Goal: Stand to Sit - Progress: Goal set today Pt will Ambulate: 51 - 150 feet;with least restrictive assistive device;with modified independence PT Goal: Ambulate - Progress: Goal set today Pt will Go Up / Down Stairs: 3-5 stairs;with supervision;with rail(s) PT Goal: Up/Down Stairs - Progress: Goal set today Additional Goals Additional Goal #1: Pt will verbalize and demonstrate back precautions independently. PT Goal: Additional Goal #1 - Progress: Goal set today  Visit Information  Last PT Received On: 01/12/13 Assistance Needed: +1    Subjective Data  Subjective: Oh I was doing that before the surgery. (log roll) Patient Stated Goal: home   Prior Functioning  Home Living Lives With: Alone Available Help at Discharge: Friend(s) Type of Home: House Home Access: Stairs to enter [employer] of Steps: 3-4 Entrance Stairs-Rails: Right Home Layout: One level Home Adaptive Equipment: Straight cane Prior Function Level of Independence: Independent with assistive device(s) Communication Communication: No  difficulties    Cognition  Cognition Arousal/Alertness: Awake/alert Behavior During Therapy: [facility] for  tasks assessed/performed Overall Cognitive Status: Within Functional Limits for tasks assessed    Extremity/Trunk Assessment Right Lower Extremity Assessment RLE ROM/Strength/Tone: Deficits RLE ROM/Strength/Tone Deficits: grossly 3/5 throughout, difficulty completing full range however able to do so RLE Sensation: Deficits RLE Sensation Deficits: reports dull sensaion in R LE compared to L as well as numbness and tingling constant (vs occasional prior to surgery) down lateral R LE RLE Coordination: WFL - gross/fine motor Left Lower Extremity Assessment LLE ROM/Strength/Tone: WFL for tasks assessed LLE Sensation: WFL - Light Touch LLE Coordination: WFL - gross/fine motor Trunk Assessment Trunk Assessment: Normal   Balance    End of Session PT - End of Session Activity Tolerance: Patient limited by pain Patient left: in chair;with call bell/phone within reach Nurse Communication: Patient requests pain meds  GP Functional Assessment Tool Used: clinical judgement Functional Limitation: Mobility: Walking and moving around Mobility: Walking and Moving Around Current Status (Z6109): At least 1 percent but less than 20 percent impaired, limited or restricted Mobility: Walking and Moving Around Goal Status [phone]): 0 percent impaired, limited or restricted   Catheryne Deford,KATHrine E 01/12/2013, 9:18 AM Zenovia Jarred, PT, DPT 01/12/2013 Pager: [phone]

## 2013-01-12 NOTE — Care Management Note (Addendum)
    Page 1 of 2   01/12/2013     1:32:10 PM   CARE MANAGEMENT NOTE 01/12/2013  Patient:  Justin [name], Justin [name]   Account Number:  [id number]  Date Initiated:  01/12/2013  Documentation initiated by:  Colleen Can  Subjective/Objective Assessment:   DX SPINAL STENOSIS, HERNIATED DISC; DECOMPRESSIVE LUMBAR LAMINECTOMY, MICRO-DISSECTOMY     Action/Plan:   PT WILL RETURN TO HIS HOME WITH HH SERVICES IN PLACE. fRIEND WILL OFFER HIM ASSISTANCE   Anticipated DC Date:  01/12/2013   Anticipated DC Plan:  HOME W HOME HEALTH SERVICES      DC Planning Services  CM consult      [facility] Choice  HOME HEALTH   Choice offered to / List presented to:     DME arranged  Levan Hurst      DME agency  [facility]     HH arranged  HH-2 PT      [facility] agency  [facility]   Status of service:  Completed, signed off Medicare Important Message given?   (If response is "NO", the following Medicare IM given date fields will be blank) Date Medicare IM given:   Date Additional Medicare IM given:    Discharge Disposition:  HOME W HOME HEALTH SERVICES  Per UR Regulation:  Reviewed for med. necessity/level of care/duration of stay  If discussed at Long Length of Stay Meetings, dates discussed:    Comments:

## 2013-01-12 NOTE — Op Note (Signed)
NAME:  BARCLAY, LENNOX              ACCOUNT NO.:  [id number]  MEDICAL RECORD NO.:  [id number]  LOCATION:  1605                         FACILITY:  [facility]  PHYSICIAN:  Georges Lynch. Daria Mcmeekin, M.D.DATE OF BIRTH:  September 23, 1950  DATE OF PROCEDURE:  01/11/2013 DATE OF DISCHARGE:                              OPERATIVE REPORT   SURGEON:  Georges Lynch. Darrelyn Hillock, M.D.  ASSISTANT:  Marlowe Kays, M.D.  PREOPERATIVE DIAGNOSES: 1. Spinal stenosis severe at L4-5. 2. Moderate spinal stenosis at L5-S1. 3. Foraminal stenosis of the L4 root on the right. 4. Foraminal stenosis for the L5 root on the right. 5. Large herniated disk at L4-5 on the right.  All his symptoms were     on the right. 6. He had a partial footdrop on the right. 7. He had marked weakness of his right leg.  POSTOPERATIVE DIAGNOSES: 1. Spinal stenosis severe at L4-5. 2. Moderate spinal stenosis at L5-S1. 3. Foraminal stenosis of the L4 root on the right. 4. Foraminal stenosis for the L5 root on the right. 5. Large herniated disk at L4-5 on the right.  All his symptoms were     on the right. 6. He had a partial footdrop on the right. 7. He had marked weakness of his right leg.  OPERATION: 1. Recent complete decompressive lumbar laminectomy at L4-5. 2. Complete decompressive lumbar laminectomy at L5-S1. 3. foraminotomies of the L4 root and L5 roots on the right. 4. Microdiskectomy at L4-L5 on the right.  DESCRIPTION OF PROCEDURE:  Under general anesthesia, the patient on spinal frame, routine orthopedic prep and draping of the lower back was carried out.  The appropriate time-out was first carried out.  Prior to that, I did mark the appropriate right side of the back in the holding area.  Following that, 2 needles were placed in the back for localization purposes.  X-ray was taken.  Incision then was made over the L4-5 interspace and extended proximally and distally.  I then stripped the muscle from the lamina and spinous processes  bilaterally. Following that, I went down and inserted instruments for another x-ray with Marcaine.  At this time, I removed the spinous process of L4 to decompressed the L4-5 space.  We did a complete central decompressive lumbar laminectomy.  I removed part of spinous processes, distally and proximally.  We went down, I brought the microscope in and gradually removed the ligamentum flavum at L4-5.  I kept proceeding distally and proximally until we had complete decompression was noted or further stenosis we had to go far distally as well as some proximally as well. We did foraminotomies as well.  The L4 root and the L5 root.  Following that, after we had a thorough decompression we noted the root still was quite tight.  One out the foramina that was because of the large herniated disk.  We gently retracted the dura and the L5 root, and cauterized lateral recess veins.  The needle was placed in the disk space and an x-ray was taken to verify the position.  At this time, a cruciate incision was made in the posterior longitudinal ligament and a complete microdiskectomy was carried out as I stated we did  use the microscope.  We had to go subligamentous because of large portion of the disk was adhered to the subligamentous space.  We kept teasing that out with a nerve hook and the Epstein curettes, so we totally freed up the nerve and the dura.  We went proximally, distally, medially and laterally.  We went out into the foramen as well.  I thoroughly irrigated the area out.  When the procedure was complete, we were able to easily move the root and dura around without any difficulty.  We thoroughly irrigated out the area, loosely applied some thrombin-soaked Gelfoam.  The good hemostasis was maintained.          ______________________________ Georges Lynch. Darrelyn Hillock, M.D.     RAG/MEDQ  D:  01/11/2013  T:  01/12/2013  Job:  [id number]

## 2013-01-12 NOTE — Progress Notes (Signed)
Subjective: 1 Day Post-Op Procedure(s) (LRB): CENTRAL DECOMPRESSION LUMBAR LAMINECTOMYL4-L5 RIGHT, MICRODISCECTOMY L4-L5 RIGHT     (1 LEVEL) (Right) Patient reports pain as 2 on 0-10 scale.  Doing very well. Strenght is better in Right Foot.  Objective: Vital signs in last 24 hours: Temp:  [97.8 F (36.6 C)-99.6 F (37.6 C)] 98.1 F (36.7 C) (06/05 0439) Pulse Rate:  [55-78] 61 (06/05 0439) Resp:  [12-19] 16 (06/05 0439) BP: (117-158)/(72-80) 139/72 mmHg (06/05 0439) SpO2:  [95 %-100 %] 98 % (06/05 0439) Weight:  [83.462 kg (184 lb)] 83.462 kg (184 lb) (06/04 1233)  Intake/Output from previous day: 06/04 0701 - 06/05 0700 In: 4303.3 [P.O.:720; I.V.:3428.3; IV Piggyback:155] Out: 1700 [Urine:1700] Intake/Output this shift:    No results found for this basename: HGB,  in the last 72 hours No results found for this basename: WBC, RBC, HCT, PLT,  in the last 72 hours No results found for this basename: NA, K, CL, CO2, BUN, CREATININE, GLUCOSE, CALCIUM,  in the last 72 hours No results found for this basename: LABPT, INR,  in the last 72 hours  Neurologically intact Dorsiflexion/Plantar flexion intact  Assessment/Plan: 1 Day Post-Op Procedure(s) (LRB): CENTRAL DECOMPRESSION LUMBAR LAMINECTOMYL4-L5 RIGHT, MICRODISCECTOMY L4-L5 RIGHT     (1 LEVEL) (Right) Discharge home with home health  Johnross Nabozny A 01/12/2013, 7:15 AM

## 2013-05-05 ENCOUNTER — Other Ambulatory Visit: Payer: Self-pay | Admitting: Orthopedic Surgery

## 2013-05-05 DIAGNOSIS — M545 Low back pain: Secondary | ICD-10-CM

## 2013-05-12 ENCOUNTER — Ambulatory Visit
Admission: RE | Admit: 2013-05-12 | Discharge: 2013-05-12 | Disposition: A | Discharge status: Home / Self Care | Payer: BC Managed Care – PPO | Source: Ambulatory Visit | Attending: Orthopedic Surgery | Admitting: Orthopedic Surgery

## 2013-05-12 DIAGNOSIS — M545 Low back pain: Secondary | ICD-10-CM

## 2013-08-10 DIAGNOSIS — J189 Pneumonia, unspecified organism: Secondary | ICD-10-CM

## 2013-08-10 HISTORY — DX: Pneumonia, unspecified organism: J18.9

## 2013-08-15 IMAGING — CR DG SPINE 1V PORT
1 series · 1 of 1 positions shown · non-contrast
Comparison: Two views lumbar spine 01/03/2013.

CLINICAL DATA: Lumbar decompression.  Localization film.

PORTABLE SPINE - 1 VIEW

[lateral]
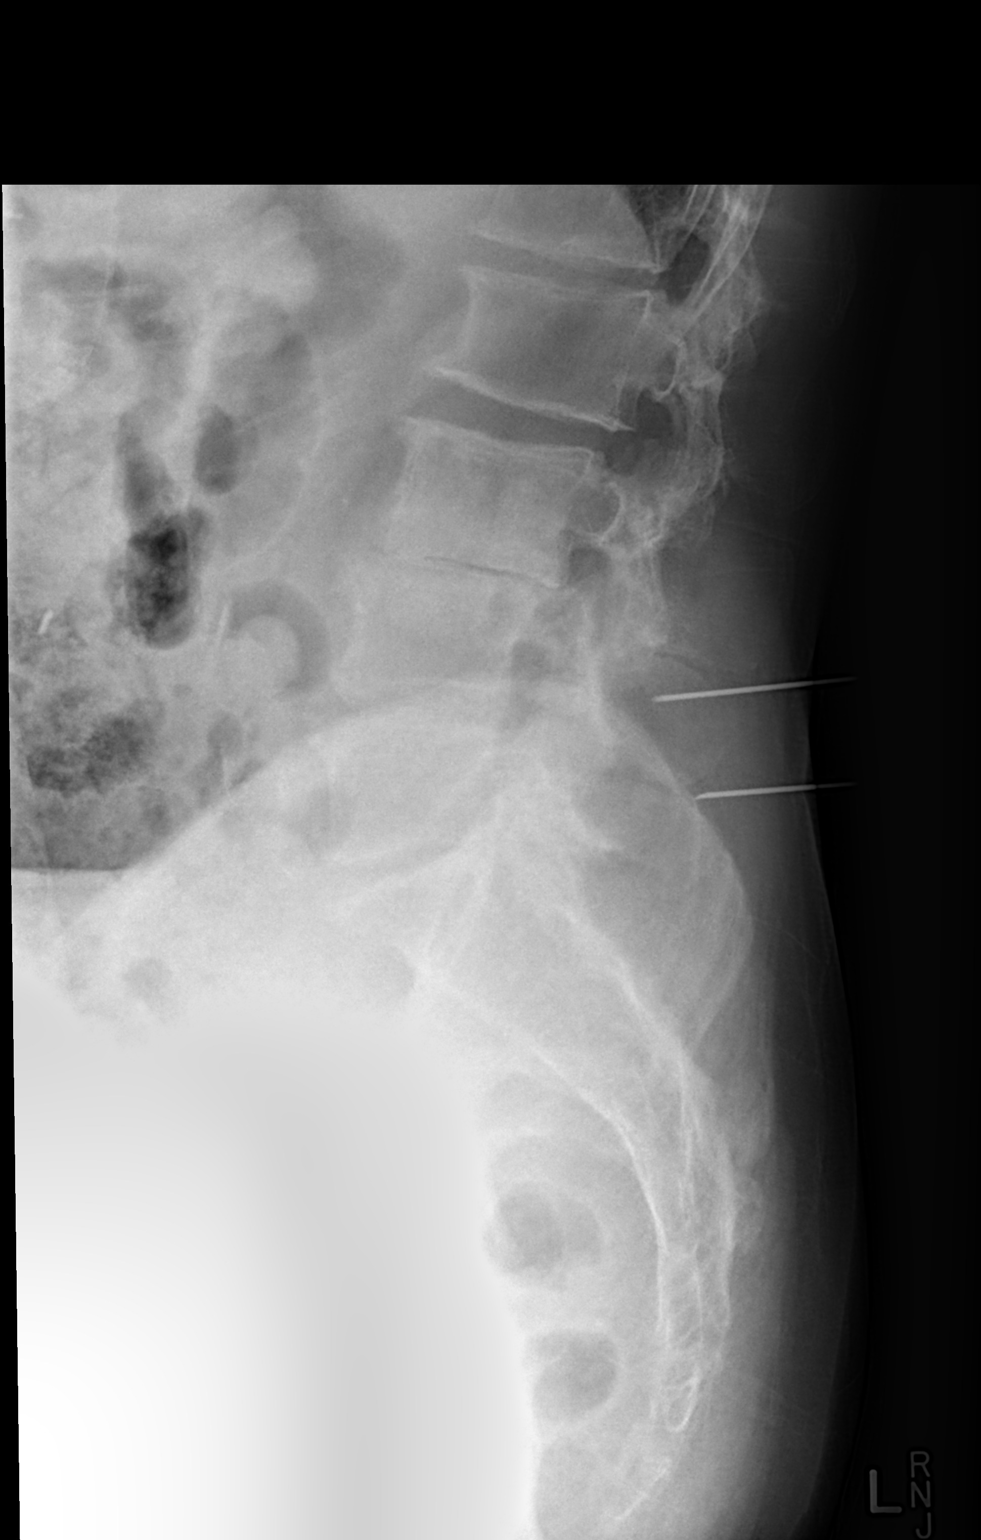

[1 of 1 positions shown; findings below may reference images not displayed]

FINDINGS: Two probes are in place.  The more superior is directed
toward the L4-5 interspace.  The more inferior is directed toward
the L5-S1 interspace.
IMPRESSION: Localization as above.

## 2013-08-15 IMAGING — CR DG SPINE 1V PORT
1 series · 1 of 1 positions shown · non-contrast
Comparison: Multiple prior studies.

CLINICAL DATA: Intraoperative localization.

PORTABLE SPINE - 1 VIEW

[lateral]
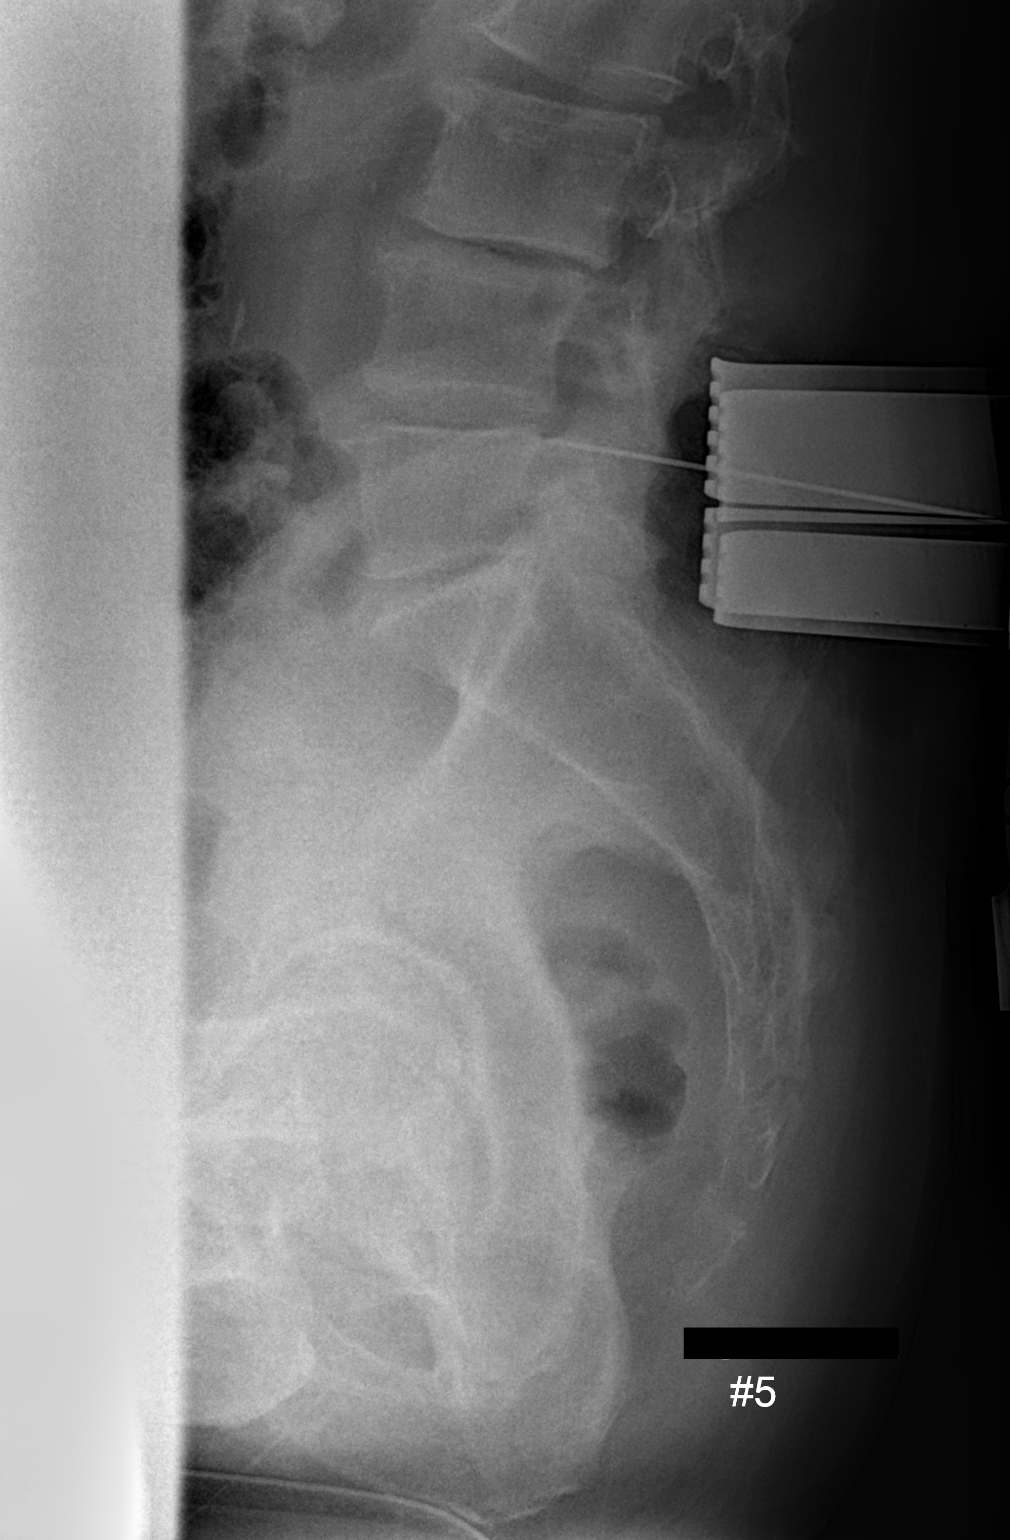

[1 of 1 positions shown; findings below may reference images not displayed]

FINDINGS: The spinal needle is marking the L4-5 disc space level.
IMPRESSION: Spinal needle localizing  L4-5.

## 2013-08-15 IMAGING — CR DG SPINE 1V PORT
1 series · 1 of 1 positions shown · non-contrast
Comparison: Plain films earlier this same day.

CLINICAL DATA: Intraoperative localization film.

PORTABLE SPINE - 1 VIEW

[lateral]
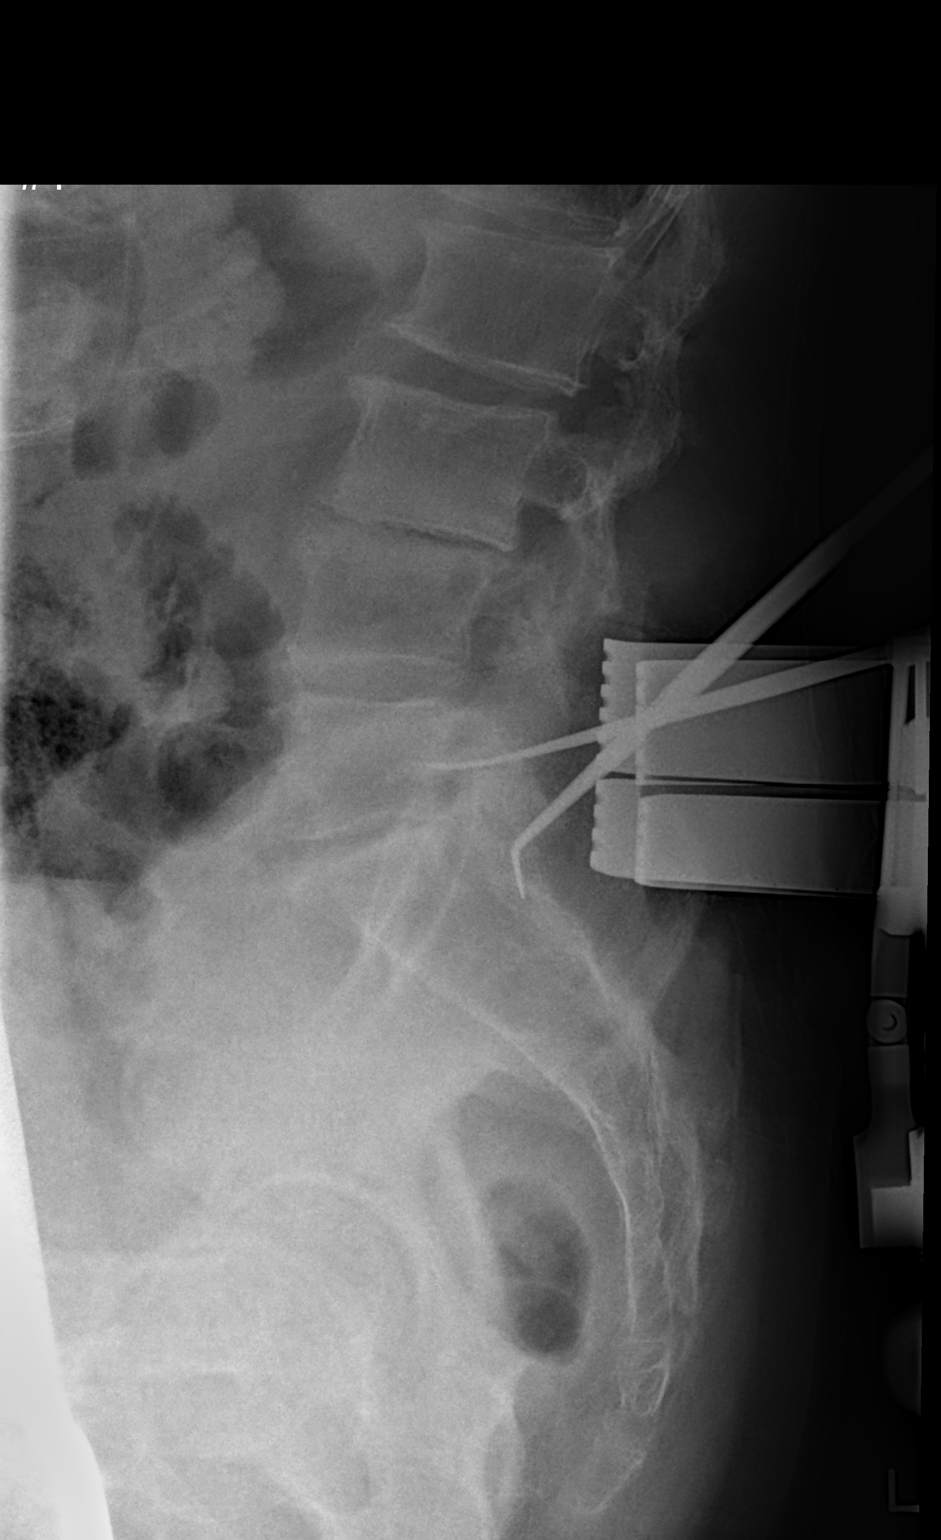

[1 of 1 positions shown; findings below may reference images not displayed]

FINDINGS: Probes are followed level of the inferior aspect of the
L5 pedicles and mid S1.
IMPRESSION: Localization as above.

## 2013-12-20 ENCOUNTER — Encounter (HOSPITAL_COMMUNITY): Payer: Self-pay

## 2013-12-25 NOTE — Pre-Procedure Instructions (Signed)
Justin [name]  12/25/2013   Your procedure is scheduled on:  Thursday, May 28.  [facility] 10:00 AM.  Call this number if you have problems the morning of surgery: [phone]   Remember:   Do not eat food or drink liquids after midnight Wednesday, May 27.  Take these medicines the morning of surgery with A SIP OF WATER: Take if needed:oxycodone (ROXICODONE), tiZANidine (ZANAFLEX).             Stop taking ibuprofen (ADVIL,MOTRIN),  Multiple Vitamin (MULTIVITAMIN WITH MINERALS) on May 21    Do not wear jewelry, make-up or nail polish.  Do not wear lotions, powders, or perfumes.              Men may shave face and neck.  Do not bring valuables to the hospital.   [facility] is not responsible for belongings or valuables.               Contacts, dentures or bridgework may not be worn into surgery.  Leave suitcase in the car. After surgery it may be brought to your room.  For patients admitted to the hospital, discharge time is determined by your treatment team.               Patients discharged the day of surgery will not be allowed to drive home.  Name and phone number of your driver: -  Special Instructions: Review  [place] - Preparing For Surgery.   Please read over the following fact sheets that you were given: Pain Booklet, Coughing and Deep Breathing and Surgical Site Infection Prevention

## 2013-12-26 ENCOUNTER — Encounter (HOSPITAL_COMMUNITY): Payer: Self-pay

## 2013-12-26 ENCOUNTER — Encounter (HOSPITAL_COMMUNITY)
Admission: RE | Admit: 2013-12-26 | Discharge: 2013-12-26 | Disposition: A | Discharge status: Home / Self Care | Payer: BC Managed Care – PPO | Source: Ambulatory Visit | Attending: Orthopedic Surgery | Admitting: Orthopedic Surgery

## 2013-12-26 ENCOUNTER — Encounter (HOSPITAL_COMMUNITY)
Admission: RE | Admit: 2013-12-26 | Discharge: 2013-12-26 | Disposition: A | Discharge status: Home / Self Care | Payer: BC Managed Care – PPO | Source: Ambulatory Visit | Attending: Anesthesiology | Admitting: Anesthesiology

## 2013-12-26 DIAGNOSIS — Z01818 Encounter for other preprocedural examination: Secondary | ICD-10-CM | POA: Insufficient documentation

## 2013-12-26 DIAGNOSIS — G8929 Other chronic pain: Secondary | ICD-10-CM | POA: Insufficient documentation

## 2013-12-26 DIAGNOSIS — M549 Dorsalgia, unspecified: Secondary | ICD-10-CM | POA: Insufficient documentation

## 2013-12-26 DIAGNOSIS — Z01812 Encounter for preprocedural laboratory examination: Secondary | ICD-10-CM | POA: Insufficient documentation

## 2013-12-26 HISTORY — DX: Other complications of anesthesia, initial encounter: T88.59XA

## 2013-12-26 HISTORY — DX: Personal history of other diseases of the respiratory system: Z87.09

## 2013-12-26 HISTORY — DX: Adverse effect of unspecified anesthetic, initial encounter: T41.45XA

## 2013-12-26 HISTORY — DX: Dorsalgia, unspecified: M54.9

## 2013-12-26 HISTORY — DX: Personal history of urinary calculi: Z87.442

## 2013-12-26 HISTORY — DX: Pain in unspecified joint: M25.50

## 2013-12-26 HISTORY — DX: Lyme disease, unspecified: A69.20

## 2013-12-26 HISTORY — DX: Effusion, unspecified joint: M25.40

## 2013-12-26 HISTORY — DX: Pneumonia, unspecified organism: J18.9

## 2013-12-26 HISTORY — DX: Weakness: R53.1

## 2013-12-26 HISTORY — DX: Other chronic pain: G89.29

## 2013-12-26 HISTORY — DX: Personal history of colonic polyps: Z86.010

## 2013-12-26 HISTORY — DX: Personal history of other diseases of the musculoskeletal system and connective tissue: Z87.39

## 2013-12-26 LAB — CBC
HEMATOCRIT: 43.1 % (ref 39.0–52.0)
Hemoglobin: 14.7 g/dL (ref 13.0–17.0)
MCH: 32.8 pg (ref 26.0–34.0)
MCHC: 34.1 g/dL (ref 30.0–36.0)
MCV: 96.2 fL (ref 78.0–100.0)
PLATELETS: 217 10*3/uL (ref 150–400)
RBC: 4.48 MIL/uL (ref 4.22–5.81)
RDW: 13 % (ref 11.5–15.5)
WBC: 4.8 10*3/uL (ref 4.0–10.5)

## 2013-12-26 LAB — SURGICAL PCR SCREEN
MRSA, PCR: NEGATIVE
Staphylococcus aureus: NEGATIVE

## 2013-12-26 NOTE — Pre-Procedure Instructions (Signed)
Justin [name]  12/26/2013   Your procedure is scheduled on:  Thurs, May 28 @ 12:05 PM  [facility] at 10:00 AM.  Call this number if you have problems the morning of surgery: [phone]   Remember:   Do not eat food or drink liquids after midnight.   Take these medicines the morning of surgery with A SIP OF WATER: Pain Pill(if needed)               Stop taking your Ibuprofen. No Goody's,BC's,Aspirin,Fish Oil,or any Herbal Medications   Do not wear jewelry  Do not wear lotions, powders, or colognes. You may wear deodorant.  Men may shave face and neck.  Do not bring valuables to the hospital.  [facility] is not responsible                  for any belongings or valuables.               Contacts, dentures or bridgework may not be worn into surgery.  Leave suitcase in the car. After surgery it may be brought to your room.  For patients admitted to the hospital, discharge time is determined by your                treatment team.               Patients discharged the day of surgery will not be allowed to drive  home.    Special Instructions:  [facility] - Preparing for Surgery  Before surgery, you can play an important role.  Because skin is not sterile, your skin needs to be as free of germs as possible.  You can reduce the number of germs on you skin by washing with CHG (chlorahexidine gluconate) soap before surgery.  CHG is an antiseptic cleaner which kills germs and bonds with the skin to continue killing germs even after washing.  Please DO NOT use if you have an allergy to CHG or antibacterial soaps.  If your skin becomes reddened/irritated stop using the CHG and inform your nurse when you arrive at Short Stay.  Do not shave (including legs and underarms) for at least 48 hours prior to the first CHG shower.  You may shave your face.  Please follow these instructions carefully:   1.  Shower with CHG Soap the night before surgery and the                                 morning of Surgery.  2.  If you choose to wash your hair, wash your hair first as usual with your       normal shampoo.  3.  After you shampoo, rinse your hair and body thoroughly to remove the                      Shampoo.  4.  Use CHG as you would any other liquid soap.  You can apply chg directly       to the skin and wash gently with scrungie or a clean washcloth.  5.  Apply the CHG Soap to your body ONLY FROM THE NECK DOWN.        Do not use on open wounds or open sores.  Avoid contact with your eyes,       ears, mouth and genitals (private parts).  Wash genitals (  private parts)       with your normal soap.  6.  Wash thoroughly, paying special attention to the area where your surgery        will be performed.  7.  Thoroughly rinse your body with warm water from the neck down.  8.  DO NOT shower/wash with your normal soap after using and rinsing off       the CHG Soap.  9.  Pat yourself dry with a clean towel.            10.  Wear clean pajamas.            11.  Place clean sheets on your bed the night of your first shower and do not        sleep with pets.  Day of Surgery  Do not apply any lotions/deoderants the morning of surgery.  Please wear clean clothes to the hospital/surgery center.    Please read over the following fact sheets that you were given: Pain Booklet, Coughing and Deep Breathing, MRSA Information and Surgical Site Infection Prevention

## 2013-12-26 NOTE — Progress Notes (Signed)
Pt doesn't have a cardiologist  Denies ever having an echo/stress test/heart cath  Medical Md is [facility]  Denies EKG in past yr  CXR done in 08/2013-[facility]

## 2013-12-26 NOTE — Progress Notes (Signed)
CXR done at PAT d/t pneumonia in Jan 2015 and no follow up CXR done

## 2013-12-27 NOTE — Progress Notes (Signed)
Anesthesia Chart Review:  Patient is a 63 year old male posted for lumbar spine cord stimulator on 01/04/14 by Dr. Shon BatonBrooks.  History includes ankylosing spondylitis, angioedema (unclear etiology; was taking Humira at that time), Lyme disease (treated with doxycycline 12/10/13 - 12/23/13), asthma, gout, nephrolithiasis, chronic back pain, smoking, tonsillectomy, septoplasty, cholecystectomy, L4-5, L5-S1 laminectomy/L4-5 microdiskectomy '14. For anesthesia history, he reported that he may "fight when waking up." PCP is with [facility].  He did not meet anesthesia requirements for a preoperative EKG; however, could consider a preoperative EKG if he was in fact treated for proven Lyme disease (versus prophylaxis for tick bite). I think if he remains asymptomatic and bedside telemetry is unremarkable then EKG could be deferred.  His assigned anesthesiologist can ultimately decide.  His last EKG on 01/03/13 showed NSR with PACs.   CXR on 12/26/13 showed: Bronchitic changes which could be acute or chronic.  He reported treatment for PNA 08/2013, but otherwise no acute cardiopulmonary symptoms. He will be further evaluated on the day of surgery to ensure no acute changes.  Preoperative CBC noted.   Surgeon orders were still pending at the time of his PAT visit, so any additional orders will have to be completed on arrival.    Velna Ochsllison Loie Jahr, PA-C [facility]/Anesthesiology Phone [phone] 12/27/2013 5:45 PM

## 2013-12-28 NOTE — Progress Notes (Signed)
Notified Dr. Shon BatonBrooks' office there are no orders in epic.

## 2013-12-29 NOTE — Progress Notes (Signed)
Left message for Rosalva Ferron to have surgical orders placed by Dr. Shon Baton.   DA

## 2014-01-03 MED ORDER — CEFAZOLIN SODIUM-DEXTROSE 2-3 GM-% IV SOLR
2.0000 g | INTRAVENOUS | Status: AC
  Administered 2014-01-04: 2 g via INTRAVENOUS
  Filled 2014-01-03: qty 50

## 2014-01-03 MED ORDER — ACETAMINOPHEN 10 MG/ML IV SOLN
1000.0000 mg | Freq: Once | INTRAVENOUS | Status: AC
  Administered 2014-01-04: 1000 mg via INTRAVENOUS
  Filled 2014-01-03: qty 100

## 2014-01-03 MED ORDER — CHLORHEXIDINE GLUCONATE 4 % EX LIQD
60.0000 mL | Freq: Once | CUTANEOUS | Status: DC
  Filled 2014-01-03: qty 60

## 2014-01-04 ENCOUNTER — Encounter (HOSPITAL_COMMUNITY): Payer: BC Managed Care – PPO | Admitting: Vascular Surgery

## 2014-01-04 ENCOUNTER — Encounter (HOSPITAL_COMMUNITY): Payer: Self-pay | Admitting: Certified Registered Nurse Anesthetist

## 2014-01-04 ENCOUNTER — Ambulatory Visit (HOSPITAL_COMMUNITY): Payer: BC Managed Care – PPO

## 2014-01-04 ENCOUNTER — Ambulatory Visit (HOSPITAL_COMMUNITY): Payer: BC Managed Care – PPO | Admitting: Certified Registered Nurse Anesthetist

## 2014-01-04 ENCOUNTER — Encounter (HOSPITAL_COMMUNITY)
Admission: RE | Disposition: A | Discharge status: Home / Self Care | Payer: Self-pay | Source: Ambulatory Visit | Attending: Orthopedic Surgery

## 2014-01-04 ENCOUNTER — Ambulatory Visit (HOSPITAL_COMMUNITY)
Admission: RE | Admit: 2014-01-04 | Discharge: 2014-01-05 | Disposition: A | Discharge status: Home / Self Care | Payer: BC Managed Care – PPO | Source: Ambulatory Visit | Attending: Orthopedic Surgery | Admitting: Orthopedic Surgery

## 2014-01-04 DIAGNOSIS — Z23 Encounter for immunization: Secondary | ICD-10-CM | POA: Insufficient documentation

## 2014-01-04 DIAGNOSIS — J45909 Unspecified asthma, uncomplicated: Secondary | ICD-10-CM | POA: Insufficient documentation

## 2014-01-04 DIAGNOSIS — M961 Postlaminectomy syndrome, not elsewhere classified: Secondary | ICD-10-CM | POA: Insufficient documentation

## 2014-01-04 DIAGNOSIS — G894 Chronic pain syndrome: Secondary | ICD-10-CM | POA: Insufficient documentation

## 2014-01-04 DIAGNOSIS — G8929 Other chronic pain: Secondary | ICD-10-CM | POA: Diagnosis present

## 2014-01-04 DIAGNOSIS — M129 Arthropathy, unspecified: Secondary | ICD-10-CM | POA: Insufficient documentation

## 2014-01-04 DIAGNOSIS — Z87891 Personal history of nicotine dependence: Secondary | ICD-10-CM | POA: Insufficient documentation

## 2014-01-04 DIAGNOSIS — Z9689 Presence of other specified functional implants: Secondary | ICD-10-CM

## 2014-01-04 HISTORY — PX: SPINAL CORD STIMULATOR INSERTION: SHX5378

## 2014-01-04 LAB — COMPREHENSIVE METABOLIC PANEL
ALBUMIN: 4.1 g/dL (ref 3.5–5.2)
ALT: 43 U/L (ref 0–53)
AST: 37 U/L (ref 0–37)
Alkaline Phosphatase: 83 U/L (ref 39–117)
BILIRUBIN TOTAL: 0.3 mg/dL (ref 0.3–1.2)
BUN: 31 mg/dL — ABNORMAL HIGH (ref 6–23)
CHLORIDE: 103 meq/L (ref 96–112)
CO2: 24 mEq/L (ref 19–32)
CREATININE: 0.97 mg/dL (ref 0.50–1.35)
Calcium: 9.5 mg/dL (ref 8.4–10.5)
GFR calc Af Amer: >90 mL/min (ref >90)
GFR calc non Af Amer: 87 mL/min — ABNORMAL LOW (ref >90)
Glucose, Bld: 99 mg/dL (ref 70–99)
Potassium: 4.8 mEq/L (ref 3.7–5.3)
Sodium: 140 mEq/L (ref 137–147)
Total Protein: 7.5 g/dL (ref 6.0–8.3)

## 2014-01-04 LAB — APTT: APTT: 30 s (ref 24–37)

## 2014-01-04 LAB — PROTIME-INR
INR: 0.83 (ref 0.00–1.49)
Prothrombin Time: 11.3 seconds — ABNORMAL LOW (ref 11.6–15.2)

## 2014-01-04 SURGERY — INSERTION, SPINAL CORD STIMULATOR, LUMBAR
Anesthesia: General | Site: Back

## 2014-01-04 MED ORDER — BUPIVACAINE-EPINEPHRINE (PF) 0.25% -1:200000 IJ SOLN
INTRAMUSCULAR | Status: AC
  Filled 2014-01-04: qty 30

## 2014-01-04 MED ORDER — METHOCARBAMOL 1000 MG/10ML IJ SOLN
500.0000 mg | Freq: Four times a day (QID) | INTRAVENOUS | Status: DC | PRN
  Filled 2014-01-04: qty 5

## 2014-01-04 MED ORDER — PHENYLEPHRINE 40 MCG/ML (10ML) SYRINGE FOR IV PUSH (FOR BLOOD PRESSURE SUPPORT)
PREFILLED_SYRINGE | INTRAVENOUS | Status: AC
  Filled 2014-01-04: qty 10

## 2014-01-04 MED ORDER — SODIUM CHLORIDE 0.9 % IV SOLN: 250.0000 mL | INTRAVENOUS | Status: DC

## 2014-01-04 MED ORDER — ONDANSETRON HCL 4 MG/2ML IJ SOLN: 4.0000 mg | INTRAMUSCULAR | Status: DC | PRN

## 2014-01-04 MED ORDER — THROMBIN 20000 UNITS EX SOLR
OROMUCOSAL | Status: DC | PRN
  Administered 2014-01-04: 14:00:00 via TOPICAL

## 2014-01-04 MED ORDER — MENTHOL 3 MG MT LOZG: 1.0000 | LOZENGE | OROMUCOSAL | Status: DC | PRN

## 2014-01-04 MED ORDER — HEMOSTATIC AGENTS (NO CHARGE) OPTIME
TOPICAL | Status: DC | PRN
  Administered 2014-01-04: 1 via TOPICAL

## 2014-01-04 MED ORDER — CEFAZOLIN SODIUM 1-5 GM-% IV SOLN
1.0000 g | Freq: Three times a day (TID) | INTRAVENOUS | Status: AC
  Administered 2014-01-04 – 2014-01-05 (×2): 1 g via INTRAVENOUS
  Filled 2014-01-04 (×2): qty 50

## 2014-01-04 MED ORDER — HYDROMORPHONE HCL PF 1 MG/ML IJ SOLN
0.2500 mg | INTRAMUSCULAR | Status: DC | PRN
  Administered 2014-01-04 (×4): 0.5 mg via INTRAVENOUS

## 2014-01-04 MED ORDER — ROCURONIUM BROMIDE 100 MG/10ML IV SOLN
INTRAVENOUS | Status: DC | PRN
  Administered 2014-01-04 (×2): 10 mg via INTRAVENOUS
  Administered 2014-01-04: 20 mg via INTRAVENOUS
  Administered 2014-01-04: 50 mg via INTRAVENOUS
  Administered 2014-01-04: 10 mg via INTRAVENOUS

## 2014-01-04 MED ORDER — ONDANSETRON HCL 4 MG/2ML IJ SOLN
INTRAMUSCULAR | Status: DC | PRN
  Administered 2014-01-04: 4 mg via INTRAVENOUS

## 2014-01-04 MED ORDER — MORPHINE SULFATE 2 MG/ML IJ SOLN
1.0000 mg | INTRAMUSCULAR | Status: DC | PRN
  Administered 2014-01-04 – 2014-01-05 (×3): 2 mg via INTRAVENOUS
  Filled 2014-01-04 (×3): qty 1

## 2014-01-04 MED ORDER — FENTANYL CITRATE 0.05 MG/ML IJ SOLN
INTRAMUSCULAR | Status: DC | PRN
  Administered 2014-01-04: 100 ug via INTRAVENOUS
  Administered 2014-01-04: 50 ug via INTRAVENOUS
  Administered 2014-01-04: 100 ug via INTRAVENOUS
  Administered 2014-01-04: 150 ug via INTRAVENOUS
  Administered 2014-01-04: 100 ug via INTRAVENOUS

## 2014-01-04 MED ORDER — DEXAMETHASONE SODIUM PHOSPHATE 4 MG/ML IJ SOLN
INTRAMUSCULAR | Status: AC
  Filled 2014-01-04: qty 2

## 2014-01-04 MED ORDER — MIDAZOLAM HCL 5 MG/5ML IJ SOLN
INTRAMUSCULAR | Status: DC | PRN
  Administered 2014-01-04: 2 mg via INTRAVENOUS

## 2014-01-04 MED ORDER — SODIUM CHLORIDE 0.9 % IJ SOLN
3.0000 mL | Freq: Two times a day (BID) | INTRAMUSCULAR | Status: DC
  Administered 2014-01-04: 3 mL via INTRAVENOUS

## 2014-01-04 MED ORDER — LACTATED RINGERS IV SOLN
INTRAVENOUS | Status: DC | PRN
  Administered 2014-01-04 (×2): via INTRAVENOUS

## 2014-01-04 MED ORDER — OXYCODONE HCL 5 MG PO TABS
ORAL_TABLET | ORAL | Status: AC
  Filled 2014-01-04: qty 2

## 2014-01-04 MED ORDER — HYDROMORPHONE HCL PF 1 MG/ML IJ SOLN
INTRAMUSCULAR | Status: AC
  Filled 2014-01-04: qty 2

## 2014-01-04 MED ORDER — LIDOCAINE HCL (CARDIAC) 20 MG/ML IV SOLN
INTRAVENOUS | Status: DC | PRN
  Administered 2014-01-04: 100 mg via INTRAVENOUS

## 2014-01-04 MED ORDER — LACTATED RINGERS IV SOLN
INTRAVENOUS | Status: DC
  Administered 2014-01-04 (×2): via INTRAVENOUS

## 2014-01-04 MED ORDER — DEXAMETHASONE SODIUM PHOSPHATE 4 MG/ML IJ SOLN
4.0000 mg | Freq: Four times a day (QID) | INTRAMUSCULAR | Status: DC
  Filled 2014-01-04 (×7): qty 1

## 2014-01-04 MED ORDER — ARTIFICIAL TEARS OP OINT
TOPICAL_OINTMENT | OPHTHALMIC | Status: AC
  Filled 2014-01-04: qty 3.5

## 2014-01-04 MED ORDER — LACTATED RINGERS IV SOLN
INTRAVENOUS | Status: DC
  Administered 2014-01-04: 20:00:00 via INTRAVENOUS

## 2014-01-04 MED ORDER — PHENYLEPHRINE HCL 10 MG/ML IJ SOLN
INTRAMUSCULAR | Status: AC
  Filled 2014-01-04: qty 1

## 2014-01-04 MED ORDER — SODIUM CHLORIDE 0.9 % IJ SOLN: 3.0000 mL | INTRAMUSCULAR | Status: DC | PRN

## 2014-01-04 MED ORDER — NEOSTIGMINE METHYLSULFATE 10 MG/10ML IV SOLN
INTRAVENOUS | Status: AC
  Filled 2014-01-04: qty 1

## 2014-01-04 MED ORDER — ARTIFICIAL TEARS OP OINT
TOPICAL_OINTMENT | OPHTHALMIC | Status: DC | PRN
  Administered 2014-01-04: 1 via OPHTHALMIC

## 2014-01-04 MED ORDER — THROMBIN 20000 UNITS EX SOLR
CUTANEOUS | Status: AC
  Filled 2014-01-04: qty 20000

## 2014-01-04 MED ORDER — PHENYLEPHRINE HCL 10 MG/ML IJ SOLN
INTRAMUSCULAR | Status: DC | PRN
  Administered 2014-01-04: 80 ug via INTRAVENOUS
  Administered 2014-01-04: 40 ug via INTRAVENOUS
  Administered 2014-01-04: 80 ug via INTRAVENOUS
  Administered 2014-01-04 (×2): 40 ug via INTRAVENOUS
  Administered 2014-01-04: 80 ug via INTRAVENOUS
  Administered 2014-01-04: 40 ug via INTRAVENOUS

## 2014-01-04 MED ORDER — GLYCOPYRROLATE 0.2 MG/ML IJ SOLN
INTRAMUSCULAR | Status: AC
  Filled 2014-01-04: qty 3

## 2014-01-04 MED ORDER — PROPOFOL 10 MG/ML IV BOLUS
INTRAVENOUS | Status: DC | PRN
  Administered 2014-01-04: 150 mg via INTRAVENOUS
  Administered 2014-01-04: 50 mg via INTRAVENOUS

## 2014-01-04 MED ORDER — LIDOCAINE HCL (CARDIAC) 20 MG/ML IV SOLN
INTRAVENOUS | Status: AC
  Filled 2014-01-04: qty 5

## 2014-01-04 MED ORDER — PNEUMOCOCCAL VAC POLYVALENT 25 MCG/0.5ML IJ INJ
0.5000 mL | INJECTION | INTRAMUSCULAR | Status: AC
  Administered 2014-01-05: 0.5 mL via INTRAMUSCULAR
  Filled 2014-01-04: qty 0.5

## 2014-01-04 MED ORDER — NEOSTIGMINE METHYLSULFATE 10 MG/10ML IV SOLN
INTRAVENOUS | Status: DC | PRN
  Administered 2014-01-04: 3 mg via INTRAVENOUS

## 2014-01-04 MED ORDER — PROPOFOL 10 MG/ML IV BOLUS
INTRAVENOUS | Status: AC
  Filled 2014-01-04: qty 20

## 2014-01-04 MED ORDER — DEXAMETHASONE 4 MG PO TABS
4.0000 mg | ORAL_TABLET | Freq: Four times a day (QID) | ORAL | Status: DC
  Administered 2014-01-04 – 2014-01-05 (×3): 4 mg via ORAL
  Filled 2014-01-04 (×7): qty 1

## 2014-01-04 MED ORDER — OXYCODONE HCL 5 MG PO TABS
10.0000 mg | ORAL_TABLET | ORAL | Status: DC | PRN
  Administered 2014-01-04 – 2014-01-05 (×5): 10 mg via ORAL
  Filled 2014-01-04 (×4): qty 2

## 2014-01-04 MED ORDER — MIDAZOLAM HCL 2 MG/2ML IJ SOLN
INTRAMUSCULAR | Status: AC
  Filled 2014-01-04: qty 2

## 2014-01-04 MED ORDER — BUPIVACAINE-EPINEPHRINE 0.25% -1:200000 IJ SOLN
INTRAMUSCULAR | Status: DC | PRN
  Administered 2014-01-04: 10 mL

## 2014-01-04 MED ORDER — ACETAMINOPHEN 10 MG/ML IV SOLN
1000.0000 mg | Freq: Four times a day (QID) | INTRAVENOUS | Status: DC
  Administered 2014-01-04 – 2014-01-05 (×3): 1000 mg via INTRAVENOUS
  Filled 2014-01-04 (×4): qty 100

## 2014-01-04 MED ORDER — FENTANYL CITRATE 0.05 MG/ML IJ SOLN
INTRAMUSCULAR | Status: AC
  Filled 2014-01-04: qty 5

## 2014-01-04 MED ORDER — ONDANSETRON HCL 4 MG/2ML IJ SOLN
INTRAMUSCULAR | Status: AC
Start: 2014-01-04 — End: 2014-01-04
  Filled 2014-01-04: qty 2

## 2014-01-04 MED ORDER — DEXAMETHASONE SODIUM PHOSPHATE 4 MG/ML IJ SOLN
INTRAMUSCULAR | Status: DC | PRN
  Administered 2014-01-04: 8 mg via INTRAVENOUS

## 2014-01-04 MED ORDER — PHENYLEPHRINE HCL 10 MG/ML IJ SOLN
10.0000 mg | INTRAVENOUS | Status: DC | PRN
  Administered 2014-01-04: 20 ug/min via INTRAVENOUS

## 2014-01-04 MED ORDER — GLYCOPYRROLATE 0.2 MG/ML IJ SOLN
INTRAMUSCULAR | Status: DC | PRN
  Administered 2014-01-04: 0.4 mg via INTRAVENOUS

## 2014-01-04 MED ORDER — PROMETHAZINE HCL 25 MG/ML IJ SOLN: 6.2500 mg | INTRAMUSCULAR | Status: DC | PRN

## 2014-01-04 MED ORDER — METHOCARBAMOL 500 MG PO TABS
ORAL_TABLET | ORAL | Status: AC
  Filled 2014-01-04: qty 1

## 2014-01-04 MED ORDER — METHOCARBAMOL 500 MG PO TABS
500.0000 mg | ORAL_TABLET | Freq: Four times a day (QID) | ORAL | Status: DC | PRN
  Administered 2014-01-04 – 2014-01-05 (×2): 500 mg via ORAL
  Filled 2014-01-04 (×2): qty 1

## 2014-01-04 MED ORDER — PHENOL 1.4 % MT LIQD: 1.0000 | OROMUCOSAL | Status: DC | PRN

## 2014-01-04 SURGICAL SUPPLY — 56 items
CANISTER SUCTION 2500CC (MISCELLANEOUS) ×2 IMPLANT
CLSR STERI-STRIP ANTIMIC 1/2X4 (GAUZE/BANDAGES/DRESSINGS) ×2 IMPLANT
CORDS BIPOLAR (ELECTRODE) ×2 IMPLANT
COVER PROBE W GEL 5X96 (DRAPES) IMPLANT
DRAPE C-ARM 42X72 X-RAY (DRAPES) ×2 IMPLANT
DRAPE INCISE IOBAN 85X60 (DRAPES) ×2 IMPLANT
DRAPE SURG 17X23 STRL (DRAPES) ×2 IMPLANT
DRAPE U-SHAPE 47X51 STRL (DRAPES) ×2 IMPLANT
DRSG MEPILEX BORDER 4X4 (GAUZE/BANDAGES/DRESSINGS) ×2 IMPLANT
DRSG MEPILEX BORDER 4X8 (GAUZE/BANDAGES/DRESSINGS) ×2 IMPLANT
DURAPREP 26ML APPLICATOR (WOUND CARE) ×2 IMPLANT
ELECT CAUTERY BLADE 6.4 (BLADE) ×2 IMPLANT
ELECT PENCIL ROCKER SW 15FT (MISCELLANEOUS) ×2 IMPLANT
ELECT REM PT RETURN 9FT ADLT (ELECTROSURGICAL) ×2
ELECTRODE REM PT RTRN 9FT ADLT (ELECTROSURGICAL) ×1 IMPLANT
GLOVE BIOGEL PI IND STRL 8 (GLOVE) ×1 IMPLANT
GLOVE BIOGEL PI IND STRL 8.5 (GLOVE) ×1 IMPLANT
GLOVE BIOGEL PI INDICATOR 8 (GLOVE) ×1
GLOVE BIOGEL PI INDICATOR 8.5 (GLOVE) ×1
GLOVE ECLIPSE 8.5 STRL (GLOVE) ×4 IMPLANT
GLOVE ORTHO TXT STRL SZ7.5 (GLOVE) ×2 IMPLANT
GOWN STRL REUS W/ TWL XL LVL3 (GOWN DISPOSABLE) ×2 IMPLANT
GOWN STRL REUS W/TWL 2XL LVL3 (GOWN DISPOSABLE) ×4 IMPLANT
GOWN STRL REUS W/TWL XL LVL3 (GOWN DISPOSABLE) ×4
KIT BASIN OR (CUSTOM PROCEDURE TRAY) ×2 IMPLANT
KIT ROOM TURNOVER OR (KITS) ×2 IMPLANT
LAMI NARROW PRIPOLE 16CH (Neuro Prosthesis/Implant) ×1 IMPLANT
NDL SPNL 18GX3.5 QUINCKE PK (NEEDLE) ×3 IMPLANT
NDL SUT 6 .5 CRC .975X.05 MAYO (NEEDLE) ×1 IMPLANT
NEEDLE 22X1 1/2 (OR ONLY) (NEEDLE) ×2 IMPLANT
NEEDLE MAYO TAPER (NEEDLE) ×2
NEEDLE SPNL 18GX3.5 QUINCKE PK (NEEDLE) ×6 IMPLANT
NS IRRIG 1000ML POUR BTL (IV SOLUTION) ×2 IMPLANT
PACK LAMINECTOMY ORTHO (CUSTOM PROCEDURE TRAY) ×2 IMPLANT
PACK UNIVERSAL I (CUSTOM PROCEDURE TRAY) ×2 IMPLANT
PAD ARMBOARD 7.5X6 YLW CONV (MISCELLANEOUS) ×4 IMPLANT
PROGRAMMER PATIENT (MISCELLANEOUS) ×1 IMPLANT
SPONGE LAP 4X18 X RAY DECT (DISPOSABLE) IMPLANT
SPONGE SURGIFOAM ABS GEL 100 (HEMOSTASIS) ×2 IMPLANT
STAPLER VISISTAT 35W (STAPLE) ×2 IMPLANT
STIMULATOR IPG PROTEGE SPINAL (Stimulator) ×1 IMPLANT
SURGIFLO TRUKIT (HEMOSTASIS) IMPLANT
SUT FIBERWIRE #2 38 REV NDL BL (SUTURE) ×2
SUT MON AB 3-0 SH 27 (SUTURE) ×4
SUT MON AB 3-0 SH27 (SUTURE) ×2 IMPLANT
SUT VIC AB 1 CT1 27 (SUTURE) ×6
SUT VIC AB 1 CT1 27XBRD ANBCTR (SUTURE) ×3 IMPLANT
SUT VIC AB 2-0 CT1 18 (SUTURE) ×2 IMPLANT
SUTURE FIBERWR#2 38 REV NDL BL (SUTURE) ×1 IMPLANT
SYR BULB IRRIGATION 50ML (SYRINGE) ×2 IMPLANT
SYR CONTROL 10ML LL (SYRINGE) ×2 IMPLANT
SYSTEM CHARGING PRODIGY (MISCELLANEOUS) ×1 IMPLANT
TOWEL OR 17X24 6PK STRL BLUE (TOWEL DISPOSABLE) ×2 IMPLANT
TOWEL OR 17X26 10 PK STRL BLUE (TOWEL DISPOSABLE) ×2 IMPLANT
TRAY FOLEY CATH 16FRSI W/METER (SET/KITS/TRAYS/PACK) IMPLANT
WATER STERILE IRR 1000ML POUR (IV SOLUTION) ×2 IMPLANT

## 2014-01-04 NOTE — Transfer of Care (Signed)
Immediate Anesthesia Transfer of Care Note  Patient: Justin [name]  Procedure(s) Performed: Procedure(s): LUMBAR SPINAL CORD STIMULATOR INSERTION (N/A)  Patient Location: PACU  Anesthesia Type:General  Level of Consciousness: awake, alert , oriented and patient cooperative  Airway & Oxygen Therapy: Patient Spontanous Breathing and Patient connected to nasal cannula oxygen  Post-op Assessment: Report given to PACU RN, Post -op Vital signs reviewed and stable and Patient moving all extremities  Post vital signs: Reviewed and stable  Complications: No apparent anesthesia complications

## 2014-01-04 NOTE — Plan of Care (Signed)
Problem: Consults Goal: Diagnosis - Spinal Surgery Spinal Stimulator insertion

## 2014-01-04 NOTE — H&P (Signed)
History of Present Illness  The patient is a 63 year old male who presents with back pain. The patient is here today in referral from Dr Ethelene Hal. The patient reports mid back and low back symptoms including pain, low back pain, tightness and stiffness which began 1 year(s) ago following a specific injury. The injury occurred at home due to a fall and twisting. The patient describes the severity of their symptoms as moderate in severity. The patient feels as if the symptoms are improving (improving with trial spinal cord stim). Current treatment includes opioid analgesics (Oxycontin 20 mg (Dr Ethelene Hal)). Past evaluation has included MRI of the lumbar spine (and MRI of the thoracic spine @ [facility]). Past treatment has included trial spinal cord stim . Note for "Back pain": The patient is in today to discuss permanent spinal cord stim.  He returns today for followup. He continues to have severe back pain but horrific neuropathic leg pain. He recently had a spinal cord stimulator trial with significant reduction in his neuropathic leg pain. As a result, he is referred to me for permanent implantation.     Allergies Indocin *ANALGESICS - ANTI-INFLAMMATORY*    Family History No pertinent family history. First Degree Relatives.    Social History Number of flights of stairs before winded. greater than 5 Marital status. married Living situation. live with spouse Tobacco use. Current some day smoker. current some days smoker Tobacco / smoke exposure. yes Pain Contract. no Illicit drug use. no Current work status. working full time Children. 0 Alcohol use. current drinker; drinks hard liquor; only occasionally per week Exercise. Exercises weekly; does running / walking Drug/Alcohol Rehab (Previously). no Drug/Alcohol Rehab (Currently). no   Medication History OxyCONTIN (20MG  Tab 12HR Deter, 1 (one) Oral every twelve hours, Taken starting 10/20/2013) Active. TiZANidine  HCl (4MG  Tablet, 1 (one) Oral three times daily, as needed, Taken starting 10/20/2013) Active. Multiple Vitamin (1 (one) Oral daily) Active. Ibuprofen (200MG  Tablet, Oral) Active. Hydrocod Polst-CPM Polst ER (10-8MG /5ML Liquid ER, Oral) Active. Levofloxacin (500MG  Tablet, Oral) Active. ProAir HFA (108 (90 Base)MCG/ACT Aerosol Soln, Inhalation) Active. Medications Reconciled.    Past Surgical History Rotator Cuff Repair. left Gallbladder Surgery. laporoscopic Appendectomy    Objective Transcription  General: He is a pleasant gentleman who appears younger than his stated age. He is alert. He is oriented x3.  Respiratory: No shortness of breath or chest pain.  Abdomen: Soft and nontender.  Musculoskeletal: He has a well healed surgical scar in his lumbar spine from a previous 4/5, 5/1 disc decompression, almost 1 year ago. He has no hip, knee or ankle pain with joint range of motion, pain when he arises from a seated position, He has some difficulty walking because of discomfort. Negative Babinski test. No clonus. Diminished but symmetrical deep tendon reflexes. 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 No incontinence of bowel or bladder. He had a thoracic MRI done 11/22/2013 which showed a disc protrusion resulting as mild mass affect without central canal stenosis at T6/7. At T9/10, there is a posterior disc protrusion on the right side without affecting the cord and without central canal stenosis. There is some slight Sherman's changes at 11/12 and T12/L1   Assessment & Plan Postlaminectomy syndrome of lumbar region (722.83  M96.1)  At this point in time, clinically, the patient does not have any contraindications for permanent spinal cord stimulator implant. We have gone over the procedure. All of his questions were addressed. We have  gone over the risks which include bleeding, infection, anesthesia, damage, death, stroke, paralysis,  major bleeding that would necessitate further surgery, ongoing or worse pain, migration of the lead, need for revision due to hardware failure. All of his questions were encouraged and addressed. We will go ahead and get this set up for the very near future.

## 2014-01-04 NOTE — Anesthesia Procedure Notes (Signed)
Procedure Name: Intubation Date/Time: 01/04/2014 1:31 PM Performed by: Vita Barley E Pre-anesthesia Checklist: Patient identified, Emergency Drugs available, Suction available and Patient being monitored Patient Re-evaluated:Patient Re-evaluated prior to inductionOxygen Delivery Method: Circle system utilized Preoxygenation: Pre-oxygenation with 100% oxygen Intubation Type: IV induction Ventilation: Mask ventilation without difficulty and Oral airway inserted - appropriate to patient size Laryngoscope Size: Hyacinth Meeker and 2 Grade View: Grade I Tube type: Oral Tube size: 7.5 mm Number of attempts: 1 Airway Equipment and Method: Stylet and Oral airway Placement Confirmation: ETT inserted through vocal cords under direct vision,  positive ETCO2 and breath sounds checked- equal and bilateral Secured at: 23 cm Dental Injury: Teeth and Oropharynx as per pre-operative assessment

## 2014-01-04 NOTE — Brief Op Note (Signed)
01/04/2014  3:41 PM  PATIENT:  Justin [name]  63 y.o. male  PRE-OPERATIVE DIAGNOSIS:  chronic pain  POST-OPERATIVE DIAGNOSIS:  chronic pain  PROCEDURE:  Procedure(s): LUMBAR SPINAL CORD STIMULATOR INSERTION (N/A)  SURGEON:  Surgeon(s) and Role:    * Venita Lick, MD - Primary  PHYSICIAN ASSISTANT:   ASSISTANTS: Zonia Kief   ANESTHESIA:   general  EBL:  Total I/O In: 1000 [I.V.:1000] Out: -   BLOOD ADMINISTERED:none  DRAINS: none   LOCAL MEDICATIONS USED:  MARCAINE     SPECIMEN:  No Specimen  DISPOSITION OF SPECIMEN:  N/A  COUNTS:  YES  TOURNIQUET:  * No tourniquets in log *  DICTATION: .Other Dictation: Dictation Number [id number]  PLAN OF CARE: Admit for overnight observation  PATIENT DISPOSITION:  PACU - hemodynamically stable.

## 2014-01-04 NOTE — Anesthesia Preprocedure Evaluation (Addendum)
Anesthesia Evaluation  Patient identified by MRN, date of birth, ID band Patient awake    Reviewed: Allergy & Precautions, H&P , NPO status , Patient's Chart, lab work & pertinent test results  History of Anesthesia Complications (+) Emergence Delirium and history of anesthetic complications  Airway Mallampati: II TM Distance: >3 FB Neck ROM: Full    Dental no notable dental hx. (+) Dental Advisory Given   Pulmonary asthma , Current Smoker, former smoker,  Smoking Status: Former Smoker - 60 pack years    Quit Smoking: 08/10/02   breath sounds clear to auscultation  Pulmonary exam normal       Cardiovascular negative cardio ROS  Rhythm:Regular Rate:Normal     Neuro/Psych negative neurological ROS  negative psych ROS   GI/Hepatic negative GI ROS, Neg liver ROS,   Endo/Other  negative endocrine ROS  Renal/GU      Musculoskeletal negative musculoskeletal ROS (+) Arthritis -,   Abdominal   Peds negative pediatric ROS (+)  Hematology negative hematology ROS (+)   Anesthesia Other Findings   Reproductive/Obstetrics negative OB ROS                          Anesthesia Physical Anesthesia Plan  ASA: II  Anesthesia Plan: General   Post-op Pain Management:    Induction: Intravenous  Airway Management Planned: Oral ETT  Additional Equipment:   Intra-op Plan:   Post-operative Plan: Extubation in OR  Informed Consent: I have reviewed the patients History and Physical, chart, labs and discussed the procedure including the risks, benefits and alternatives for the proposed anesthesia with the patient or authorized representative who has indicated his/her understanding and acceptance.   Dental advisory given  Plan Discussed with: CRNA, Anesthesiologist and Surgeon  Anesthesia Plan Comments:         Anesthesia Quick Evaluation

## 2014-01-04 NOTE — Anesthesia Postprocedure Evaluation (Signed)
Anesthesia Post Note  Patient: Justin [name]  Procedure(s) Performed: Procedure(s) (LRB): LUMBAR SPINAL CORD STIMULATOR INSERTION (N/A)  Anesthesia type: general  Patient location: PACU  Post pain: Pain level controlled  Post assessment: Patient's Cardiovascular Status Stable  Last Vitals:  Filed Vitals:   01/04/14 1730  BP:   Pulse: 54  Temp: 36.7 C  Resp: 15    Post vital signs: Reviewed and stable  Level of consciousness: sedated  Complications: No apparent anesthesia complications

## 2014-01-05 ENCOUNTER — Encounter (HOSPITAL_COMMUNITY): Payer: Self-pay | Admitting: Orthopedic Surgery

## 2014-01-05 MED ORDER — ONDANSETRON 4 MG PO TBDP: 4.0000 mg | ORAL_TABLET | Freq: Three times a day (TID) | ORAL | Status: DC | PRN

## 2014-01-05 MED ORDER — DOCUSATE SODIUM 100 MG PO CAPS: 100.0000 mg | ORAL_CAPSULE | Freq: Two times a day (BID) | ORAL | Status: DC

## 2014-01-05 MED ORDER — OXYCODONE-ACETAMINOPHEN 5-325 MG PO TABS: 1.0000 | ORAL_TABLET | Freq: Four times a day (QID) | ORAL | Status: DC | PRN

## 2014-01-05 MED ORDER — POLYETHYLENE GLYCOL 3350 17 G PO PACK: 17.0000 g | PACK | Freq: Every day | ORAL | Status: DC

## 2014-01-05 NOTE — Progress Notes (Signed)
    Subjective: Procedure(s) (LRB): LUMBAR SPINAL CORD STIMULATOR INSERTION (N/A) 1 Day Post-Op  Patient reports pain as 3 on 0-10 scale.  Reports decreased leg pain reports incisional back pain   Positive void Negative bowel movement Positive flatus Negative chest pain or shortness of breath  Objective: Vital signs in last 24 hours: Temp:  [97.9 F (36.6 C)-98.5 F (36.9 C)] 97.9 F (36.6 C) (05/29 0508) Pulse Rate:  [54-75] 58 (05/29 0508) Resp:  [10-23] 16 (05/29 0508) BP: (116-138)/(63-108) 126/63 mmHg (05/29 0508) SpO2:  [94 %-100 %] 94 % (05/29 0508) Weight:  [192 lb (87.091 kg)] 192 lb (87.091 kg) (05/29 0306)  Intake/Output from previous day: 05/28 0701 - 05/29 0700 In: 2660 [P.O.:580; I.V.:1980; IV Piggyback:100] Out: -   Labs: No results found for this basename: WBC, RBC, HCT, PLT,  in the last 72 hours  Recent Labs  01/04/14 1040  NA 140  K 4.8  CL 103  CO2 24  BUN 31*  CREATININE 0.97  GLUCOSE 99  CALCIUM 9.5    Recent Labs  01/04/14 1040  INR 0.83    Physical Exam: Neurologically intact ABD soft Intact pulses distally Incision: dressing C/D/I and no drainage Compartment soft  Assessment/Plan: Patient stable  Continue mobilization with physical therapy Continue care  Advance diet Up with therapy D/C IV fluids D/c today with percocet 5/325 for breakthrough pain. 1 po q4-6.   Patient has long acting oxycontin at home. Will also provide robaxin for muscle spasms F/U in 2 weeks  Venita Lick, MD [facility] [phone]

## 2014-01-05 NOTE — Progress Notes (Signed)
[employer] Representative came to pt's room and turned on stimulator with no complications. Discharge paperwork, prescriptions, reasons to return to ED/MD, and follow up appts reviewed with pt. Pt offered no questions. PIV removed. Pt d/c to care of friend.

## 2014-01-05 NOTE — Progress Notes (Signed)
PT Cancellation Note  Patient Details Name: Justin [name] MRN: [id number] DOB: February 11, 1951   Cancelled Treatment:    Reason Eval/Treat Not Completed: PT screened, no needs identified, will sign off.  Per OT, patient ambulating in room without assistance.  Spoke with pt who reports he does not have any PT needs.  Will sign off.   Sela Hilding Zayden Maffei, PT 01/05/2014, 10:30 AM

## 2014-01-05 NOTE — Evaluation (Signed)
Occupational Therapy Evaluation Patient Details Name: Justin GrillLarry W Abood MRN: [id number] DOB: 21-Oct-1950 Today's Date: 01/05/2014    History of Present Illness s/p LUMBAR SPINAL CORD STIMULATOR INSERTION   Clinical Impression   Pt s/p lumbar spinal cord stimulator insertion.  Pt requires increased time to complete mobility tasks but is mobilizing at overall mod I level.  Pt instructed on back precautions and ADL techniques.  No further acute OT needs at this time. Signing off.    Follow Up Recommendations  No OT follow up    Equipment Recommendations  None recommended by OT    Recommendations for Other Services       Precautions / Restrictions Precautions Precautions: Back Precaution Booklet Issued: Yes (comment) Precaution Comments: Educated pt on 3/3 back precautions. Restrictions Weight Bearing Restrictions: No      Mobility Bed Mobility               General bed mobility comments: Pt did not complete bed mobility tasks due to pain. Able to independently verbalize log roll technique. States that he uses log roll technique at home.  Transfers Overall transfer level: Modified independent               General transfer comment: Incr time due to pain.    Balance                                            ADL Overall ADL's : Needs assistance/impaired Eating/Feeding: Independent   Grooming: Modified independent;Wash/dry hands;Wash/dry face   Upper Body Bathing: Modified independent;Sitting   Lower Body Bathing: Modified independent;Sit to/from stand   Upper Body Dressing : Modified independent;Sitting   Lower Body Dressing: Modified independent;Sit to/from stand               Functional mobility during ADLs: Modified independent General ADL Comments: Pt ambulating in room upon OT arrival.  He reports he is more comfortable standing.  Pt instructed on safe LB dressing technique (attempting to stand and bend over in order to don  shorts).  OT recommended pt sit on EOB in order to complete LB bathing/dressing tasks.      Vision                     Perception     Praxis      Pertinent Vitals/Pain See vitals tab     Hand Dominance     Extremity/Trunk Assessment Upper Extremity Assessment Upper Extremity Assessment: Overall WFL for tasks assessed           Communication Communication Communication: No difficulties   Cognition Arousal/Alertness: Awake/alert Behavior During Therapy: WFL for tasks assessed/performed Overall Cognitive Status: Within Functional Limits for tasks assessed                     General Comments       Exercises       Shoulder Instructions      Home Living Family/patient expects to be discharged to:: Private residence Living Arrangements: Spouse/significant other Available Help at Discharge: Family;Friend(s);Available PRN/intermittently Type of Home: House Home Access: Stairs to enter Entergy CorporationEntrance Stairs-Number of Steps: 3-4 Entrance Stairs-Rails: Right Home Layout: One level     Bathroom Shower/Tub: Chief Strategy OfficerTub/shower unit   Bathroom Toilet: Standard (counter top next to toilet )     Home Equipment: Cane - single point  Additional Comments: Pt reports he does have a back brace (lumbar corset) that he has used in PT.        Prior Functioning/Environment Level of Independence: Independent             OT Diagnosis:     OT Problem List:     OT Treatment/Interventions:      OT Goals(Current goals can be found in the care plan section) Acute Rehab OT Goals Patient Stated Goal: to go home  OT Frequency:     Barriers to D/C:            Co-evaluation              End of Session Nurse Communication: Mobility status  Activity Tolerance: Patient tolerated treatment well Patient left: in bed;with nursing/sitter in room;with call bell/phone within reach (sitting EOB)   Time: 0626-9485 OT Time Calculation (min): 17 min Charges:  OT  General Charges $OT Visit: 1 Procedure OT Evaluation $Initial OT Evaluation Tier I: 1 Procedure OT Treatments $Self Care/Home Management : 8-22 mins G-Codes: OT G-codes **NOT FOR INPATIENT CLASS** Functional Assessment Tool Used: clinical judgment Functional Limitation: Self care Self Care Current Status (I6270): At least 1 percent but less than 20 percent impaired, limited or restricted Self Care Goal Status (J5009): At least 1 percent but less than 20 percent impaired, limited or restricted Self Care Discharge Status [phone]): At least 1 percent but less than 20 percent impaired, limited or restricted  Grant Ruts 01/05/2014, 10:31 AM  01/05/2014 Grant Ruts OTR/L Pager [phone] Office [phone]

## 2014-01-05 NOTE — Progress Notes (Signed)
CARE MANAGEMENT NOTE 01/05/2014  Patient:  Justin [name], Justin [name]   Account Number:  [id number]  Date Initiated:  01/05/2014  Documentation initiated by:  [facility]  Subjective/Objective Assessment:   admitted s/p lumbar spinal cord stimulator insertion     Action/Plan:   PT/OT eavls-no therapy or equipment recommended   Anticipated DC Date:  01/05/2014   Anticipated DC Plan:  HOME/SELF CARE      DC Planning Services  CM consult      Choice offered to / List presented to:             Status of service:  Completed, signed off Medicare Important Message given?   (If response is "NO", the following Medicare IM given date fields will be blank) Date Medicare IM given:   Date Additional Medicare IM given:    Discharge Disposition:  HOME/SELF CARE

## 2014-01-05 NOTE — Op Note (Signed)
Justin [name], Justin [name]              ACCOUNT NO.:  [id number]  MEDICAL RECORD NO.:  [id number]  LOCATION:  [id number]                        FACILITY:  [facility]  PHYSICIAN:  Alvy Bealahari D Willis Kuipers, MD    DATE OF BIRTH:  02-24-1951  DATE OF PROCEDURE:  01/04/2014 DATE OF DISCHARGE:                              OPERATIVE REPORT   PREOPERATIVE DIAGNOSIS:  Chronic pain syndrome.  POSTOPERATIVE DIAGNOSIS:  Chronic pain syndrome.  OPERATIVE PROCEDURE:  Implantation of spinal cord stimulator.  COMPLICATIONS:  None.  FIRST ASSISTANT:  Genene ChurnJames M. Denton Meekwens, P.A.  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  Attempts at conservative management had failed to alleviate his symptoms and so we elected to proceed with surgery.  The patient did have a trial in place, had significant reduction in his pain and so we elected to proceed with a permanent implant.  All appropriate risks, benefits, and alternatives to surgery were discussed with the patient and consent was obtained.  OPERATIVE NOTE:  The patient was brought to the operating room, placed supine on the operating table.  After successful induction of general anesthesia and endotracheal intubation, TEDs, SCDs were applied.  He was turned prone onto the Wilson frame and all bony prominences were padded. The thoracic and lumbar spine were prepped and draped in a standard fashion.  Time-out was taken to confirm the patient, procedure, and all other pertinent important data.  Once this was done, in the lateral plane, we counted up from the L5 level to identify the T9 pedicle, as well as the T9 lamina.  Once this was done, I used the AP plane, counting the rib cages with T12 being the last rib bearing vertebrae and counted up and confirmed that this was the appropriate level.  Once this was done, I infiltrated the proposed incision site and made an incision starting at the T9 pedicle  and proceeding inferiorly towards the T11 pedicle.  Sharp dissection was carried out down to the deep fascia.  The deep fascia was sharply incised in the midline and I used a Cobb elevator gently to mobilize the paraspinal muscles to expose the lamina of T9 and a portion of that of T10.  Once I had the exposure bilaterally, I placed the self-retaining retractor and then counted again from the L5 vertebral level to confirm my level.  Once I confirmed the T9 lamina in both planes, I used a double-action Leksell rongeur to remove the bulk of the T9 spinous process.  I then used a small nerve curette to develop a plane underneath the lamina of T9, and performed a laminotomy using a 2 and 3 mm Kerrison rongeur.  Once I had adequate exposure, I then used Penfield 4 to develop a plane between the thecal sac and the ligamentum flavum.  I then used my 2-mm Kerrison to resect the ligamentum flavum and exposed the dorsal surface of the thecal sac.  I then used a small dural spatula to gently palpate up along the dorsal surface of the thecal sac.  I was able to freely get the device to pass with no significant tension or  resistance.  This was removed and then the actual implant was obtained.  I gently advanced the implant and again it mobilized, it went without resistance.  After 2 or 3 attempts at positioning in, I got it in the center, slightly to the right.  This was the appropriate level for pain reduction.  It spanned from the inferior aspect of T7 down to the T8-9 disc space.  I irrigated the wound copiously with normal saline and then secured the leads to the spinous process of T10 using FiberWire.  With it secured, I made a second incision over the left gluteal region.  This is what we decided preoperatively.  I dissected down 2-1/2 cm and then created a pocket.  Once that was created, I then used the submuscular transferring device and passed the wires from the thoracic incision down to the  left gluteal incision.  I then cleaned them off and secured them in place and placed them into the battery and torqued down the holding nuts.  I then wrapped the remaining portion of the wire underneath the battery and placed it into the pocket.  I secured it to the deep fascia with interrupted #1 Vicryl sutures.  I then tested the battery and according to the rep, it was functioning fine.  With the implant properly positioned, I then irrigated both wounds copiously with normal saline. I then checked to ensure I had hemostasis using bipolar electrocautery. Once this was completed, I closed the deep fascia of the thoracic incision with #1 Vicryl sutures and a layer of 2-0 Vicryl sutures and a 3-0 Monocryl.  The second gluteal incision was closed with a layer of #1 Vicryl sutures, 2-0 Vicryl sutures, and 3-0 Monocryl.  Steri-Strips and dry dressings were applied to all of the incision sites.  The patient was then extubated and transferred to the PACU without incident.  At the end of the case, all needle and sponge counts were correct.  There was no adverse intraoperative events.     Alvy Beal, MD     DDB/MEDQ  D:  01/04/2014  T:  01/05/2014  Job:  [id number]

## 2016-11-09 ENCOUNTER — Other Ambulatory Visit: Payer: Self-pay | Admitting: Orthopedic Surgery

## 2016-11-09 DIAGNOSIS — M25561 Pain in right knee: Secondary | ICD-10-CM

## 2016-11-16 ENCOUNTER — Other Ambulatory Visit: Payer: Self-pay

## 2016-11-19 ENCOUNTER — Ambulatory Visit
Admission: RE | Admit: 2016-11-19 | Discharge: 2016-11-19 | Disposition: A | Discharge status: Home / Self Care | Payer: Managed Care, Other (non HMO) | Source: Ambulatory Visit | Attending: Orthopedic Surgery | Admitting: Orthopedic Surgery

## 2016-11-19 DIAGNOSIS — M25561 Pain in right knee: Secondary | ICD-10-CM

## 2016-11-19 MED ORDER — IOPAMIDOL (ISOVUE-M 200) INJECTION 41%
30.0000 mL | Freq: Once | INTRAMUSCULAR | Status: AC
  Administered 2016-11-19: 30 mL via INTRA_ARTICULAR

## 2017-09-06 DIAGNOSIS — Z79891 Long term (current) use of opiate analgesic: Secondary | ICD-10-CM | POA: Insufficient documentation

## 2020-05-07 ENCOUNTER — Other Ambulatory Visit (HOSPITAL_COMMUNITY): Payer: Self-pay | Admitting: Radiology

## 2020-05-07 DIAGNOSIS — J45909 Unspecified asthma, uncomplicated: Secondary | ICD-10-CM

## 2020-07-16 ENCOUNTER — Other Ambulatory Visit: Payer: Self-pay | Admitting: Internal Medicine

## 2020-07-16 DIAGNOSIS — R7989 Other specified abnormal findings of blood chemistry: Secondary | ICD-10-CM

## 2020-07-17 ENCOUNTER — Other Ambulatory Visit: Payer: Managed Care, Other (non HMO)

## 2020-07-19 ENCOUNTER — Ambulatory Visit
Admission: RE | Admit: 2020-07-19 | Discharge: 2020-07-19 | Disposition: A | Discharge status: Home / Self Care | Payer: Medicare Other | Source: Ambulatory Visit | Attending: Internal Medicine | Admitting: Internal Medicine

## 2020-07-19 ENCOUNTER — Encounter: Payer: Self-pay | Admitting: Internal Medicine

## 2020-07-19 ENCOUNTER — Other Ambulatory Visit: Payer: Self-pay

## 2020-07-19 ENCOUNTER — Other Ambulatory Visit: Payer: Self-pay | Admitting: Internal Medicine

## 2020-07-19 ENCOUNTER — Other Ambulatory Visit (HOSPITAL_COMMUNITY): Payer: Self-pay | Admitting: Internal Medicine

## 2020-07-19 DIAGNOSIS — R7989 Other specified abnormal findings of blood chemistry: Secondary | ICD-10-CM | POA: Insufficient documentation

## 2020-07-19 NOTE — Progress Notes (Signed)
From pcp office.  Patient is a 69 year old male with a history of A. fib on Eliquis, COPD on Symbicort and albuterol as needed, chronic back pain and ankylosing spondylitis on opioids who has a history of elevated LFTs and a history of hepatitis C exposure although RNA is negative as of recently.  He initially presented to me for follow-up of his breathing which is improved with the Symbicort and we did CMP labs to follow-up elevated  LFTs as I had expected they had improved because of a fair amount of weight loss, however ALT and AST were elevated to the 300s, alkaline phosphatase was elevated to the 400s and bilirubin was 5.  He told me that he had started taking this [place] supplement for his breathing called ?respim.  And I asked him to stop this as he asked suspect cause.  As of today, bilirubin has increased to 10, alkaline phosphatase the 500s, but AST and A LT have improved to about 150.  --Last Saturday, unbeknownst to me he was having some right upper quadrant abdominal pain and nausea and vomiting which makes hepatitis A infection possible, I have IgM pending and is not back yet.  He has been afebrile and without fevers which is reassuring against an infection like cholangitis.  Less likely Budd-Chiari because liver Doppler was okay and radiologist commented that there was no intrahepat ic ductal dilation which was reassuring against obstruction especially with the bilirubin of 10.  Perhaps this is only drug-induced liver injury from the [place] herb, as above, and the bilirubin is just lagging behind, but I did encourage him to present to the emergency department to find the cause SCr 1.6 today and was 1.5 last week, INR was 1 -I expect he will present to [facility] for further evaluation  -Painless jaundice? Consi der a ct scan -MRI with and without contrast of the abdomen and MRCP -I did ask him to come back by on Monday/Tuesday to recheck liver numbers and hoping for improvement -Urgent emergent  precautions were given, if he develops confusion, right upper quadrant pain again, or fever he should present to the emergency department

## 2020-07-23 ENCOUNTER — Encounter (HOSPITAL_COMMUNITY): Payer: Self-pay

## 2020-07-23 ENCOUNTER — Emergency Department (HOSPITAL_COMMUNITY): Payer: Medicare Other

## 2020-07-23 ENCOUNTER — Other Ambulatory Visit: Payer: Self-pay

## 2020-07-23 ENCOUNTER — Other Ambulatory Visit: Payer: Self-pay | Admitting: Internal Medicine

## 2020-07-23 ENCOUNTER — Other Ambulatory Visit (HOSPITAL_COMMUNITY): Payer: Self-pay | Admitting: Internal Medicine

## 2020-07-23 ENCOUNTER — Ambulatory Visit (HOSPITAL_COMMUNITY): Admission: RE | Admit: 2020-07-23 | Payer: Medicare Other | Source: Ambulatory Visit

## 2020-07-23 ENCOUNTER — Inpatient Hospital Stay (HOSPITAL_COMMUNITY)
Admission: EM | Admit: 2020-07-23 | Discharge: 2020-07-26 | DRG: 872 | Disposition: A | Discharge status: Home / Self Care | Payer: Medicare Other | Attending: Internal Medicine | Admitting: Internal Medicine

## 2020-07-23 DIAGNOSIS — Z8249 Family history of ischemic heart disease and other diseases of the circulatory system: Secondary | ICD-10-CM

## 2020-07-23 DIAGNOSIS — E876 Hypokalemia: Secondary | ICD-10-CM | POA: Diagnosis present

## 2020-07-23 DIAGNOSIS — I480 Paroxysmal atrial fibrillation: Secondary | ICD-10-CM | POA: Diagnosis present

## 2020-07-23 DIAGNOSIS — B1920 Unspecified viral hepatitis C without hepatic coma: Secondary | ICD-10-CM | POA: Diagnosis present

## 2020-07-23 DIAGNOSIS — K8031 Calculus of bile duct with cholangitis, unspecified, with obstruction: Secondary | ICD-10-CM | POA: Diagnosis present

## 2020-07-23 DIAGNOSIS — R7989 Other specified abnormal findings of blood chemistry: Secondary | ICD-10-CM

## 2020-07-23 DIAGNOSIS — K429 Umbilical hernia without obstruction or gangrene: Secondary | ICD-10-CM | POA: Diagnosis present

## 2020-07-23 DIAGNOSIS — I4891 Unspecified atrial fibrillation: Secondary | ICD-10-CM | POA: Diagnosis not present

## 2020-07-23 DIAGNOSIS — N183 Chronic kidney disease, stage 3 unspecified: Secondary | ICD-10-CM | POA: Diagnosis present

## 2020-07-23 DIAGNOSIS — R17 Unspecified jaundice: Secondary | ICD-10-CM

## 2020-07-23 DIAGNOSIS — A419 Sepsis, unspecified organism: Principal | ICD-10-CM | POA: Diagnosis present

## 2020-07-23 DIAGNOSIS — I129 Hypertensive chronic kidney disease with stage 1 through stage 4 chronic kidney disease, or unspecified chronic kidney disease: Secondary | ICD-10-CM | POA: Diagnosis present

## 2020-07-23 DIAGNOSIS — Z87442 Personal history of urinary calculi: Secondary | ICD-10-CM | POA: Diagnosis not present

## 2020-07-23 DIAGNOSIS — Z20822 Contact with and (suspected) exposure to covid-19: Secondary | ICD-10-CM | POA: Diagnosis present

## 2020-07-23 DIAGNOSIS — Z800 Family history of malignant neoplasm of digestive organs: Secondary | ICD-10-CM | POA: Diagnosis not present

## 2020-07-23 DIAGNOSIS — J449 Chronic obstructive pulmonary disease, unspecified: Secondary | ICD-10-CM | POA: Diagnosis present

## 2020-07-23 DIAGNOSIS — Z9049 Acquired absence of other specified parts of digestive tract: Secondary | ICD-10-CM | POA: Diagnosis not present

## 2020-07-23 DIAGNOSIS — K219 Gastro-esophageal reflux disease without esophagitis: Secondary | ICD-10-CM | POA: Diagnosis present

## 2020-07-23 DIAGNOSIS — Z8719 Personal history of other diseases of the digestive system: Secondary | ICD-10-CM | POA: Diagnosis not present

## 2020-07-23 DIAGNOSIS — K8309 Other cholangitis: Secondary | ICD-10-CM

## 2020-07-23 DIAGNOSIS — K8050 Calculus of bile duct without cholangitis or cholecystitis without obstruction: Secondary | ICD-10-CM

## 2020-07-23 DIAGNOSIS — Z8619 Personal history of other infectious and parasitic diseases: Secondary | ICD-10-CM | POA: Diagnosis not present

## 2020-07-23 DIAGNOSIS — R1011 Right upper quadrant pain: Secondary | ICD-10-CM | POA: Diagnosis not present

## 2020-07-23 DIAGNOSIS — G8929 Other chronic pain: Secondary | ICD-10-CM | POA: Diagnosis present

## 2020-07-23 DIAGNOSIS — R109 Unspecified abdominal pain: Secondary | ICD-10-CM

## 2020-07-23 DIAGNOSIS — N179 Acute kidney failure, unspecified: Secondary | ICD-10-CM | POA: Diagnosis present

## 2020-07-23 DIAGNOSIS — F17290 Nicotine dependence, other tobacco product, uncomplicated: Secondary | ICD-10-CM | POA: Diagnosis present

## 2020-07-23 DIAGNOSIS — I4510 Unspecified right bundle-branch block: Secondary | ICD-10-CM | POA: Diagnosis present

## 2020-07-23 DIAGNOSIS — R011 Cardiac murmur, unspecified: Secondary | ICD-10-CM | POA: Diagnosis not present

## 2020-07-23 DIAGNOSIS — Z7951 Long term (current) use of inhaled steroids: Secondary | ICD-10-CM

## 2020-07-23 DIAGNOSIS — Z79899 Other long term (current) drug therapy: Secondary | ICD-10-CM

## 2020-07-23 DIAGNOSIS — Z7901 Long term (current) use of anticoagulants: Secondary | ICD-10-CM | POA: Diagnosis not present

## 2020-07-23 LAB — RESP PANEL BY RT-PCR (FLU A&B, COVID) ARPGX2
Influenza A by PCR: NEGATIVE
Influenza B by PCR: NEGATIVE
SARS Coronavirus 2 by RT PCR: NEGATIVE

## 2020-07-23 LAB — CBC
HCT: 41 % (ref 39.0–52.0)
Hemoglobin: 14.1 g/dL (ref 13.0–17.0)
MCH: 34.8 pg — ABNORMAL HIGH (ref 26.0–34.0)
MCHC: 34.4 g/dL (ref 30.0–36.0)
MCV: 101.2 fL — ABNORMAL HIGH (ref 80.0–100.0)
Platelets: 302 10*3/uL (ref 150–400)
RBC: 4.05 MIL/uL — ABNORMAL LOW (ref 4.22–5.81)
RDW: 15.5 % (ref 11.5–15.5)
WBC: 23.1 10*3/uL — ABNORMAL HIGH (ref 4.0–10.5)
nRBC: 0 % (ref 0.0–0.2)

## 2020-07-23 LAB — URINALYSIS, ROUTINE W REFLEX MICROSCOPIC
Glucose, UA: NEGATIVE mg/dL
Hgb urine dipstick: NEGATIVE
Ketones, ur: NEGATIVE mg/dL
Leukocytes,Ua: NEGATIVE
Nitrite: NEGATIVE
Protein, ur: 100 mg/dL — AB
Specific Gravity, Urine: 1.027 (ref 1.005–1.030)
pH: 6 (ref 5.0–8.0)

## 2020-07-23 LAB — COMPREHENSIVE METABOLIC PANEL
ALT: 235 U/L — ABNORMAL HIGH (ref 0–44)
AST: 280 U/L — ABNORMAL HIGH (ref 15–41)
Albumin: 3.3 g/dL — ABNORMAL LOW (ref 3.5–5.0)
Alkaline Phosphatase: 818 U/L — ABNORMAL HIGH (ref 38–126)
Anion gap: 14 (ref 5–15)
BUN: 27 mg/dL — ABNORMAL HIGH (ref 8–23)
CO2: 30 mmol/L (ref 22–32)
Calcium: 9.3 mg/dL (ref 8.9–10.3)
Chloride: 93 mmol/L — ABNORMAL LOW (ref 98–111)
Creatinine, Ser: 1.45 mg/dL — ABNORMAL HIGH (ref 0.61–1.24)
GFR, Estimated: 53 mL/min — ABNORMAL LOW (ref >60)
Glucose, Bld: 141 mg/dL — ABNORMAL HIGH (ref 70–99)
Potassium: 3.9 mmol/L (ref 3.5–5.1)
Sodium: 137 mmol/L (ref 135–145)
Total Bilirubin: 17.9 mg/dL — ABNORMAL HIGH (ref 0.3–1.2)
Total Protein: 7.8 g/dL (ref 6.5–8.1)

## 2020-07-23 LAB — HEPATITIS PANEL, ACUTE
HCV Ab: REACTIVE — AB
Hep A IgM: NONREACTIVE
Hep B C IgM: NONREACTIVE
Hepatitis B Surface Ag: NONREACTIVE

## 2020-07-23 LAB — LIPASE, BLOOD: Lipase: 21 U/L (ref 11–51)

## 2020-07-23 MED ORDER — FENTANYL CITRATE (PF) 100 MCG/2ML IJ SOLN
100.0000 ug | Freq: Once | INTRAMUSCULAR | Status: AC
Start: 2020-07-23 — End: 2020-07-23
  Administered 2020-07-23: 22:00:00 100 ug via INTRAVENOUS
  Filled 2020-07-23: qty 2

## 2020-07-23 MED ORDER — ACETAMINOPHEN 325 MG PO TABS: 650.0000 mg | ORAL_TABLET | Freq: Four times a day (QID) | ORAL | Status: DC | PRN

## 2020-07-23 MED ORDER — SODIUM CHLORIDE 0.9 % IV SOLN: INTRAVENOUS | Status: DC

## 2020-07-23 MED ORDER — MOMETASONE FURO-FORMOTEROL FUM 200-5 MCG/ACT IN AERO
2.0000 | INHALATION_SPRAY | Freq: Two times a day (BID) | RESPIRATORY_TRACT | Status: DC
  Filled 2020-07-23: qty 8.8

## 2020-07-23 MED ORDER — ALBUTEROL SULFATE HFA 108 (90 BASE) MCG/ACT IN AERS: 2.0000 | INHALATION_SPRAY | RESPIRATORY_TRACT | Status: DC | PRN

## 2020-07-23 MED ORDER — SODIUM CHLORIDE 0.9 % IV BOLUS
500.0000 mL | Freq: Once | INTRAVENOUS | Status: AC
  Administered 2020-07-23: 23:00:00 500 mL via INTRAVENOUS

## 2020-07-23 MED ORDER — SODIUM CHLORIDE 0.9 % IV BOLUS
1000.0000 mL | Freq: Once | INTRAVENOUS | Status: AC
  Administered 2020-07-23: 22:00:00 1000 mL via INTRAVENOUS

## 2020-07-23 MED ORDER — IOHEXOL 300 MG/ML  SOLN
100.0000 mL | Freq: Once | INTRAMUSCULAR | Status: AC | PRN
  Administered 2020-07-23: 100 mL via INTRAVENOUS

## 2020-07-23 MED ORDER — HYDROMORPHONE HCL 1 MG/ML IJ SOLN
0.5000 mg | INTRAMUSCULAR | Status: DC | PRN
  Administered 2020-07-23 – 2020-07-26 (×12): 1 mg via INTRAVENOUS
  Filled 2020-07-23 (×12): qty 1

## 2020-07-23 MED ORDER — ALBUTEROL SULFATE (2.5 MG/3ML) 0.083% IN NEBU: 2.5000 mg | INHALATION_SOLUTION | RESPIRATORY_TRACT | Status: DC | PRN

## 2020-07-23 MED ORDER — PIPERACILLIN-TAZOBACTAM 3.375 G IVPB
3.3750 g | Freq: Three times a day (TID) | INTRAVENOUS | Status: DC
  Administered 2020-07-24 – 2020-07-26 (×7): 3.375 g via INTRAVENOUS
  Filled 2020-07-23 (×8): qty 50

## 2020-07-23 MED ORDER — VANCOMYCIN HCL 2000 MG/400ML IV SOLN
2000.0000 mg | Freq: Once | INTRAVENOUS | Status: AC
  Administered 2020-07-23: 2000 mg via INTRAVENOUS
  Filled 2020-07-23 (×2): qty 400

## 2020-07-23 MED ORDER — METOPROLOL SUCCINATE ER 25 MG PO TB24
50.0000 mg | ORAL_TABLET | Freq: Every day | ORAL | Status: DC
  Administered 2020-07-23 – 2020-07-24 (×2): 50 mg via ORAL
  Filled 2020-07-23 (×2): qty 1

## 2020-07-23 MED ORDER — ONDANSETRON HCL 4 MG/2ML IJ SOLN
4.0000 mg | Freq: Once | INTRAMUSCULAR | Status: AC
  Administered 2020-07-23: 22:00:00 4 mg via INTRAVENOUS
  Filled 2020-07-23: qty 2

## 2020-07-23 MED ORDER — PIPERACILLIN-TAZOBACTAM 3.375 G IVPB 30 MIN
3.3750 g | Freq: Once | INTRAVENOUS | Status: AC
  Administered 2020-07-23: 3.375 g via INTRAVENOUS
  Filled 2020-07-23: qty 50

## 2020-07-23 MED ORDER — SODIUM CHLORIDE 0.9 % IV SOLN
75.0000 mL/h | INTRAVENOUS | Status: AC
  Administered 2020-07-23: 75 mL/h via INTRAVENOUS

## 2020-07-23 NOTE — H&P (Signed)
Justin [name] EZM:[id number] DOB: Nov 07, 1950 DOA: 07/23/2020     PCP: Sueanne Margarita, DO   Outpatient Specialists:  NONE    Patient arrived to ER on 07/23/20 at 1417 Referred by Attending Milton Ferguson, MD   Patient coming from: home Lives   With family    Chief Complaint:  Chief Complaint  Patient presents with  . Abdominal Pain    HPI: Justin [name] is a 69 y.o. male with medical history significant of A. fib on Eliquis, COPD on Symbicort and albuterol as needed, chronic back pain and ankylosing spondylitis on opioids who has a history of elevated LFTs and a history of hepatitis C exposure although RNA is negative , sp chocystectomy    Presented with 1 week history of right upper quadrant pain Patient was seen in the office for elevated LFTs 400-300 range bilirubin was up to five Of note patient recently have started to use [place] supplement for his breathing Respim. His LFTs continued to climb and on 10 December his bilirubin was up to 10 alk phos 500 although his AST T improved to 150s Patient have had some right upper quadrant pain nausea and vomiting Initial Doppler showed no intrahepatic ductal dilatation.  Initial thought that this was drug-induced liver injury. His creatinine is also noted to be elevated around 1.5 INR in the office was 1.9 Patient was unsure what causing abdominal pain he actually felt there was a little bit better when he ate.  But sometimes he throws up after eating.  He drinking about 3-4 beers a week. Denies using Tylenol recently No fevers no chest pain or shortness of breath   Reports he has lost significant amount of weight And developed periumbilical hernia over past few months  Infectious risk factors:  Reports  N/V abdominal pain,      Has NOt been vaccinated against COVID refuses   Initial COVID TEST   in house  PCR testing  Pending  No results found for: SARSCOV2NAA   Regarding pertinent Chronic problems:    History of hepatitis C -RNA was negative       Asthma -well  controlled on home inhalers     A. Fib -  - CHA2DS2 vas score 3    current  on anticoagulation with Xarelto,            -  Rate control:  Currently controlled with  Toprolol,      While in ER: WBC 23 Noted to have abdominal tenderness AST OT 200 8235 range alk phos 818 total bili 17.9 Ultrasound showed dilatation of extrahepatic bile duct status post cholecystectomy CT ascending cholangitis choledocholithiasis 16 x 13 mm stone in distal common duct Started vanc and zosyn  ER Provider Called:  [facility]   Dr.Buccini They Recommend admit to medicine  Keep NPO, ERCP in AM Will see in AM   Hospitalist was called for admission for cholangitis  The following Work up has been ordered so far:  Orders Placed This Encounter  Procedures  . Blood culture (routine x 2)  . Resp Panel by RT-PCR (Flu A&B, Covid) Nasopharyngeal Swab  . US Abdomen Limited RUQ (LIVER/GB)  . CT ABDOMEN PELVIS W CONTRAST  . Lipase, blood  . Comprehensive metabolic panel  . CBC  . Urinalysis, Routine w reflex microscopic  . Hepatitis panel, acute  . Diet NPO time specified  . Height and weight  . Consult to gastroenterology  ALL PATIENTS BEING ADMITTED/HAVING PROCEDURES NEED COVID-19  SCREENING  . Consult to hospitalist  ALL PATIENTS BEING ADMITTED/HAVING PROCEDURES NEED COVID-19 SCREENING   Following Medications were ordered in ER: Medications  piperacillin-tazobactam (ZOSYN) IVPB 3.375 g (has no administration in time range)  vancomycin (VANCOREADY) IVPB 2000 mg/400 mL (has no administration in time range)  iohexol (OMNIPAQUE) 300 MG/ML solution 100 mL (100 mLs Intravenous Contrast Given 07/23/20 2018)        Consult Orders  (From admission, onward)         Start     Ordered   07/23/20 2058  Consult to hospitalist  ALL PATIENTS BEING ADMITTED/HAVING PROCEDURES NEED COVID-19 SCREENING  Once       Comments: ALL PATIENTS BEING  ADMITTED/HAVING PROCEDURES NEED COVID-19 SCREENING  Provider:  (Not yet assigned)  Question Answer Comment  Place call to: [facility]   Reason for Consult Admit      07/23/20 2057          Significant initial  Findings: Abnormal Labs Reviewed  COMPREHENSIVE METABOLIC PANEL - Abnormal; Notable for the following components:      Result Value   Chloride 93 (*)    Glucose, Bld 141 (*)    BUN 27 (*)    Creatinine, Ser 1.45 (*)    Albumin 3.3 (*)    AST 280 (*)    ALT 235 (*)    Alkaline Phosphatase 818 (*)    Total Bilirubin 17.9 (*)    GFR, Estimated 53 (*)    All other components within normal limits  CBC - Abnormal; Notable for the following components:   WBC 23.1 (*)    RBC 4.05 (*)    MCV 101.2 (*)    MCH 34.8 (*)    All other components within normal limits  URINALYSIS, ROUTINE W REFLEX MICROSCOPIC - Abnormal; Notable for the following components:   Color, Urine AMBER (*)    APPearance HAZY (*)    Bilirubin Urine MODERATE (*)    Protein, ur 100 (*)    Bacteria, UA RARE (*)    Non Squamous Epithelial 0-5 (*)    All other components within normal limits    Otherwise labs showing:    Recent Labs  Lab 07/23/20 1537  NA 137  K 3.9  CO2 30  GLUCOSE 141*  BUN 27*  CREATININE 1.45*  CALCIUM 9.3    Cr    Up from baseline see below Lab Results  Component Value Date   CREATININE 1.45 (H) 07/23/2020   CREATININE 0.97 01/04/2014   CREATININE 0.88 01/03/2013    Recent Labs  Lab 07/23/20 1537  AST 280*  ALT 235*  ALKPHOS 818*  BILITOT 17.9*  PROT 7.8  ALBUMIN 3.3*   Lab Results  Component Value Date   CALCIUM 9.3 07/23/2020      WBC      Component Value Date/Time   WBC 23.1 (H) 07/23/2020 1537   LYMPHSABS 1.9 04/02/2012 1806   MONOABS 1.1 (H) 04/02/2012 1806   EOSABS 0.0 04/02/2012 1806   BASOSABS 0.0 04/02/2012 1806   Plt: Lab Results  Component Value Date   PLT 302 07/23/2020      HG/HCT   Stable     Component Value Date/Time    HGB 14.1 07/23/2020 1537   HCT 41.0 07/23/2020 1537   MCV 101.2 (H) 07/23/2020 1537    Recent Labs  Lab 07/23/20 1537  LIPASE 21   No results for input(s): AMMONIA in the last 168 hours.    ECG:  Ordered      UA  no evidence of UTI      Urine analysis:    Component Value Date/Time   COLORURINE AMBER (A) 07/23/2020 1800   APPEARANCEUR HAZY (A) 07/23/2020 1800   LABSPEC 1.027 07/23/2020 1800   PHURINE 6.0 07/23/2020 1800   GLUCOSEU NEGATIVE 07/23/2020 1800   HGBUR NEGATIVE 07/23/2020 1800   BILIRUBINUR MODERATE (A) 07/23/2020 1800   KETONESUR NEGATIVE 07/23/2020 1800   PROTEINUR 100 (A) 07/23/2020 1800   UROBILINOGEN 0.2 01/03/2013 1520   NITRITE NEGATIVE 07/23/2020 1800   LEUKOCYTESUR NEGATIVE 07/23/2020 1800      Ordered  US showed Marked dilation of the extrahepatic bile duct Hepatic steatosis   ED Triage Vitals  Enc Vitals Group     BP 07/23/20 1425 (!) 108/91     Pulse Rate 07/23/20 1425 91     Resp 07/23/20 1425 20     Temp 07/23/20 1425 (!) 97.5 F (36.4 C)     Temp Source 07/23/20 1425 Oral     SpO2 07/23/20 1425 100 %     Weight 07/23/20 2056 199 lb (90.3 kg)     Height 07/23/20 2056 5' 11"  (1.803 m)     Head Circumference --      Peak Flow --      Pain Score 07/23/20 1427 6     Pain Loc --      Pain Edu? --      Excl. in [facility]? --   TMAX(24)@       Latest  Blood pressure (!) 133/99, pulse 97, temperature 98.2 F (36.8 C), temperature source Oral, resp. rate 20, height 5' 11"  (1.803 m), weight 90.3 kg, SpO2 95 %.     Review of Systems:    Pertinent positives include  fatigue, weight loss  abdominal pain, nausea, vomiting, loss of appetite,jaundice Constitutional:  No weight loss, night sweats, Fevers, chills,  HEENT:  No headaches, Difficulty swallowing,Tooth/dental problems,Sore throat,  No sneezing, itching, ear ache, nasal congestion, post nasal drip,  Cardio-vascular:  No chest pain, Orthopnea, PND, anasarca, dizziness, palpitations.no  Bilateral lower extremity swelling  GI:  No heartburn, indigestion,  diarrhea, change in bowel habits, melena, blood in stool, hematemesis Resp:  no shortness of breath at rest. No dyspnea on exertion, No excess mucus, no productive cough, No non-productive cough, No coughing up of blood.No change in color of mucus.No wheezing. Skin:  no rash or lesions.  GU:  no dysuria, change in color of urine, no urgency or frequency. No straining to urinate.  No flank pain.  Musculoskeletal:  No joint pain or no joint swelling. No decreased range of motion. No back pain.  Psych:  No change in mood or affect. No depression or anxiety. No memory loss.  Neuro: no localizing neurological complaints, no tingling, no weakness, no double vision, no gait abnormality, no slurred speech, no confusion  All systems reviewed and apart from [place] all are negative  Past Medical History:   Past Medical History:  Diagnosis Date  . Angioedema 03/28/2012   "tongue and throat"- was never known why"was taking humira at the time.  . Arthritis    "hands""back"  . Asthma    treated for over 28yr ago  . Chronic back pain    scoliosis/spondylosis  . Complication of anesthesia    may try fight when waking up  . History of bronchitis 10+yrs ago  . History of colon polyps   . History of gout  last time many yrs ago  . History of kidney stones   . Joint pain   . Joint swelling   . Lyme disease    just completed treatment with Doxycycline   . Pneumonia 08/2013  . Spondylitis, ankylosing ([place])   . Weakness    and numbness in right leg     Past Surgical History:  Procedure Laterality Date  . APPENDECTOMY    . CHOLECYSTECTOMY    . COLONOSCOPY    . ESOPHAGOGASTRODUODENOSCOPY    . knuckle surgery     right middle finger a couple of times  . LUMBAR LAMINECTOMY/DECOMPRESSION MICRODISCECTOMY Right 01/11/2013   Procedure: CENTRAL DECOMPRESSION LUMBAR LAMINECTOMYL4-L5 RIGHT, MICRODISCECTOMY L4-L5 RIGHT     (1  LEVEL);  Surgeon: Tobi Bastos, MD;  Location: [facility];  Service: Orthopedics;  Laterality: Right;  . SEPTOPLASTY    . SHOULDER OPEN ROTATOR CUFF REPAIR Left    x 2;impingement syndrome and torn rotator cuff  . SPINAL CORD STIMULATOR INSERTION N/A 01/04/2014   Procedure: LUMBAR SPINAL CORD STIMULATOR INSERTION;  Surgeon: Melina Schools, MD;  Location: [facility];  Service: Orthopedics;  Laterality: N/A;  . TONSILLECTOMY      Social History:  Ambulatory cane,       reports that he has been smoking cigars. He has never used smokeless tobacco. He reports current alcohol use. He reports that he does not use drugs.   Family History:   Family History  Problem Relation Age of Onset  . Hypertension Other     Allergies: Allergies  Allergen Reactions  . Indocin [Indomethacin] Other (See Comments)    Severe headaches.   Lebron Quam [Hydrocodone-Acetaminophen] Nausea And Vomiting     Prior to Admission medications   Medication Sig Start Date End Date Taking? Authorizing Provider  docusate sodium (COLACE) 100 MG capsule Take 1 capsule (100 mg total) by mouth 2 (two) times daily. 01/05/14   Lanae Crumbly, PA-C  doxycycline (VIBRA-TABS) 100 MG tablet Take 100 mg by mouth 2 (two) times daily. For 3 weeks 12/10/13   [provider]  Multiple Vitamin (MULTIVITAMIN WITH MINERALS) TABS Take 1 tablet by mouth daily.    [provider]  ondansetron (ZOFRAN ODT) 4 MG disintegrating tablet Take 1 tablet (4 mg total) by mouth every 8 (eight) hours as needed. 01/05/14   Lanae Crumbly, PA-C  oxycodone (ROXICODONE) 30 MG immediate release tablet Take 30 mg by mouth 2 (two) times daily.    [provider]  oxyCODONE-acetaminophen (ROXICET) 5-325 MG per tablet Take 1 tablet by mouth every 6 (six) hours as needed for severe pain. 01/05/14   Lanae Crumbly, PA-C  polyethylene glycol [facility] Floria Raveling) packet Take 17 g by mouth daily. 01/05/14   Lanae Crumbly, PA-C  tiZANidine (ZANAFLEX) 4  MG tablet Take 4 mg by mouth every 8 (eight) hours as needed for muscle spasms.    [provider]   Physical Exam: Vitals with BMI 07/23/2020 07/23/2020 07/23/2020  Height - 5' 11"  -  Weight - 199 lbs -  BMI - 46.50 -  Systolic 354 - 656  Diastolic 99 - 96  Pulse 97 - 93    1. General:  in No Acute distress    Chronically ill  -appearing 2. Psychological: Alert and  Oriented 3. Head/ENT:    Dry Mucous Membranes  Head Non traumatic, neck supple                           Poor Dentition                            icteric 4. SKIN:   decreased Skin turgor,  Skin clean Dry and intact no rash 5. Heart: Regular rate and rhythm systolic Murmur, no Rub or gallop 6. Lungs:  Clear to auscultation bilaterally, no wheezes or crackles   7. Abdomen: Soft, RUQ tender, Non distended   obese bowel sounds present 8. Lower extremities: no clubbing, cyanosis, no edema 9. Neurologically Grossly intact, moving all 4 extremities equally  10. MSK: Normal range of motion   All other LABS:     Recent Labs  Lab 07/23/20 1537  WBC 23.1*  HGB 14.1  HCT 41.0  MCV 101.2*  PLT 302     Recent Labs  Lab 07/23/20 1537  NA 137  K 3.9  CL 93*  CO2 30  GLUCOSE 141*  BUN 27*  CREATININE 1.45*  CALCIUM 9.3     Recent Labs  Lab 07/23/20 1537  AST 280*  ALT 235*  ALKPHOS 818*  BILITOT 17.9*  PROT 7.8  ALBUMIN 3.3*       Cultures:    Component Value Date/Time   SDES ABSCESS 11/01/2008 1145   SDES ABSCESS 11/01/2008 1145   SPECREQUEST LEFT INDEX FINGER 11/01/2008 1145   SPECREQUEST LEFT INDEX FINGER 11/01/2008 1145   CULT NO ANAEROBES ISOLATED 11/01/2008 1145   CULT  11/01/2008 1145    MODERATE STAPHYLOCOCCUS AUREUS Note: RIFAMPIN AND GENTAMICIN SHOULD NOT BE USED AS SINGLE DRUGS FOR TREATMENT OF STAPH INFECTIONS. This organism is presumed to be Clindamycin resistant based on detection of inducible Clindamycin resistance.   REPTSTATUS 11/06/2008 FINAL  11/01/2008 1145   REPTSTATUS 11/04/2008 FINAL 11/01/2008 1145     Radiological Exams on Admission: CT ABDOMEN PELVIS W CONTRAST  Addendum Date: 07/23/2020   ADDENDUM REPORT: 07/23/2020 20:46 ADDENDUM: These results were called by telephone at the time of interpretation on 07/23/2020 at 8:43 pm to provider [facility] , who verbally acknowledged these results. Electronically Signed   By: Fidela Salisbury MD   On: 07/23/2020 20:46   Result Date: 07/23/2020 CLINICAL DATA:  Right lower quadrant abdominal pain EXAM: CT ABDOMEN AND PELVIS WITH CONTRAST TECHNIQUE: Multidetector CT imaging of the abdomen and pelvis was performed using the standard protocol following bolus administration of intravenous contrast. CONTRAST:  134m OMNIPAQUE IOHEXOL 300 MG/ML  SOLN COMPARISON:  12/02/2006 FINDINGS: Lower chest: The visualized lung bases are clear bilaterally. Mild coronary artery calcification. Cardiac size is at the upper limits of normal. No pericardial effusion. Dorsal column stimulator leads extend superior to the upper margin of the examination. Hepatobiliary: Cholecystectomy has been performed. There is marked extrahepatic biliary ductal dilation with a 16 mm x 13 mm stone identified within the a distal duct in keeping with choledocholithiasis. Moderate periductal inflammatory stranding is present and there is enhancement of the biliary wall suggesting changes of superimposed cholangitis. No intrahepatic biliary ductal dilation. Mild to moderate hepatic steatosis. Portal vein is patent. Pancreas: Unremarkable Spleen: Unremarkable Adrenals/Urinary Tract: Adrenal glands are unremarkable. Kidneys are normal, without renal calculi, focal lesion, or hydronephrosis. Bladder is unremarkable. Stomach/Bowel: Severe sigmoid diverticulosis. Diverticulum noted arising from the gastric fundus. The stomach, small bowel, and large bowel are otherwise unremarkable.  No evidence of obstruction or focal inflammation.  Appendectomy has been performed. Small fat containing umbilical hernia. No free intraperitoneal gas or fluid. Vascular/Lymphatic: Moderate aortoiliac atherosclerotic calcification. No aortic aneurysm. No pathologic adenopathy within the abdomen and pelvis. Reproductive: Prostate is unremarkable. Other: Rectum unremarkable Musculoskeletal: Dorsal column stimulator battery pack noted within the left gluteal subcutaneous soft tissues. Degenerative changes are seen within the lumbar spine. No acute bone abnormality. IMPRESSION: Choledocholithiasis with a 16 mm x 13 mm calculus identified within the distal common duct. Marked extrahepatic biliary ductal dilation. Periductal inflammatory changes are also identified suggesting superimposed cholangitis. Aortic Atherosclerosis (ICD10-I70.0). Electronically Signed: By: Fidela Salisbury MD On: 07/23/2020 20:40   US Abdomen Limited RUQ (LIVER/GB)  Result Date: 07/23/2020 CLINICAL DATA:  Right upper quadrant abdominal pain EXAM: ULTRASOUND ABDOMEN LIMITED RIGHT UPPER QUADRANT COMPARISON:  07/19/2020, 12/20/2006 FINDINGS: Gallbladder: Absent Common bile duct: Diameter: Dilated measuring 12-13 mm in mid to distal diameter Liver: The liver parenchyma is diffusely heterogeneously increased in echogenicity and the echotexture is mildly coarsened in keeping with changes of probable moderate hepatic steatosis, stable since prior examination. There is no intrahepatic biliary ductal dilation identified. No intrahepatic mass lesion seen. Portal vein is patent on color Doppler imaging with normal direction of blood flow towards the liver. Other: No ascites is present. IMPRESSION: Marked dilation of the extrahepatic bile duct. This appears stable since immediate prior examination, though progressed since remote prior examination. While this may simply represent post cholecystectomy change, correlation with liver enzymes would be helpful to exclude an obstructive process. If abnormal,  ERCP or MRCP examination would be more helpful to assess the distal duct. Moderate hepatic steatosis Status post cholecystectomy Electronically Signed   By: Fidela Salisbury MD   On: 07/23/2020 19:25    Chart has been reviewed   Assessment/Plan   69 y.o. male with medical history significant of A. fib on Eliquis, COPD on Symbicort and albuterol as needed, chronic back pain and ankylosing spondylitis on opioids who has a history of elevated LFTs and a history of hepatitis C exposure although RNA is negative   Admitted for cholangitis  Present on Admission: . Cholangitis - admit for IV rehydration and pain control, bowel rest, and empiric antibiotics (zosyn)   Appreciate GI consul ERCP in AM  Keep NPO  Sepsis -   -SIRS criteria met with  elevated white blood cell count,       Component Value Date/Time   WBC 23.1 (H) 07/23/2020 1537   LYMPHSABS 1.9 04/02/2012 1806     tachycardia   ,    RR >20 Today's Vitals   07/23/20 2129 07/23/20 2145 07/23/20 2215 07/23/20 2227  BP:  130/85 (!) 152/106   Pulse: (!) 138 (!) 131 (!) 120   Resp:  (!) 21 20   Temp:      TempSrc:      SpO2:  91% 94%   Weight:      Height:      PainSc:    4    Body mass index is 27.75 kg/m.  This patient meets SIRS Criteria and may be septic.  -Most likely source being:  intra-abdominal,       - Obtain serial lactic acid and procalcitonin level.  - Initiated IV antibiotics   - await results of blood and urine culture  - Rehydrate aggressively          11:01 PM  . AKI (acute kidney injury) ([place]) - obtain urine electrolytes rehydrate  .  History of hepatitis C - obtain quant, defer to GI  . Chronic pain - restart home meds when able to tolerate   . Atrial fibrillation with RVR ([facility]) - rehydrate, restart betablocker when BP allows Patient may need IV rate control given tachycardia. Rehydrate and if necessary will start diltiazem drip.  Cardiac murmur obtain echogram  Other plan as per orders.  DVT  prophylaxis:  SCD     Code Status:    Code Status: Prior FULL CODE   as per patient   I had personally discussed CODE STATUS with patient     Family Communication:   Family not at  Bedside    Disposition Plan:   To home once workup is complete and patient is stable   Following barriers for discharge:                            Electrolytes corrected                                                           Pain controlled with PO medications                               white count improving able to transition to PO antibiotics                             Will need to be able to tolerate PO                                                       Will need consultants to evaluate patient prior to discharge                       Consults called: [facility] is aware   Admission status:  ED Disposition    ED Disposition Condition [place]: [facility] [[id number]]  Level of Care: Telemetry [5]  Admit to tele based on following criteria: Other see comments  Comments: sepsis  May admit patient to [facility] or [facility] if equivalent level of care is available:: No  Covid Evaluation: Asymptomatic Screening Protocol (No Symptoms)  Diagnosis: Cholangitis [576.1.ICD-9-CM]  Admitting Physician: Toy Baker [[id number]]  Attending Physician: Toy Baker [[id number]]  Estimated length of stay: past midnight tomorrow  Certification:: I certify this patient will need inpatient services for at least 2 midnights          inpatient     I Expect 2 midnight stay secondary to severity of patient's current illness need for inpatient interventions justified by the following:  hemodynamic instability despite optimal treatment (tachycardia  )  Severe lab/radiological/exam abnormalities including:    cholangitis  and extensive comorbidities including:  Chronic pain .   Chronic anticoagulation  That are currently affecting medical management.   I expect   patient to be hospitalized for 2 midnights requiring inpatient medical care.  Patient is at high risk for adverse  outcome (such as loss of life or disability) if not treated.  Indication for inpatient stay as follows:    Hemodynamic instability despite maximal medical therapy,    severe pain requiring acute inpatient management,  inability to maintain oral hydration    Need for operative/procedural  intervention    Need for IV antibiotics, IV fluids,     Level of care      SDU tele indefinitely please discontinue once patient no longer qualifies COVID-19 Labs   No results found for: SARSCOV2NAA   Precautions: admitted as  asymptomatic screening protocol   PPE: Used by the provider:    P100  eye Goggles,  Gloves    Kohler Pellerito 07/23/2020, 11:06 PM    [facility]     after 2 AM please page floor coverage PA If 7AM-7PM, please contact the day team taking care of the patient using [email or web address]   Patient was evaluated in the context of the global COVID-19 pandemic, which necessitated consideration that the patient might be at risk for infection with the SARS-CoV-2 virus that causes COVID-19. Institutional protocols and algorithms that pertain to the evaluation of patients at risk for COVID-19 are in a state of rapid change based on information released by regulatory bodies including the CDC and federal and state organizations. These policies and algorithms were followed during the patient's care.

## 2020-07-23 NOTE — ED Triage Notes (Signed)
Pt reports RUQ pain x1 week. Pt reports he began vomiting this morning. Pt states he went to PCP last week and had blood work here. Pt states he was sent here to rule out liver and kidney issues. Pts eyes appear to be jaundice at this time.

## 2020-07-23 NOTE — ED Notes (Signed)
RN made aware of vitals  

## 2020-07-23 NOTE — ED Provider Notes (Signed)
[facility] DEPT Provider Note   CSN: [id number] Arrival date & time: 07/23/20  1417     History Chief Complaint  Patient presents with  . Abdominal Pain    Justin [name] is a 69 y.o. male past 1 history of asthma, chronic back pain, Lyme disease who presents for evaluation of right upper quadrant abdominal pain, nausea/vomiting, jaundice.  He states about a week ago, he started developing right upper quadrant abdominal pain.  He states that it was intermittent.  He states that he does not recall anything specifically that would trigger the pain but states that he was able to eat without inducing any of the pain.  He states that actually felt slightly better when he ate.  He saw his primary care doctor and did some labs and was noted to have some abnormal LFTs.  He additionally started having some nausea/vomiting last night and was prompted to go to the emergency department for further evaluation.  He states he is not had any difficulties urinating or pain with urination.  His last bowel movement was yesterday and was normal in appearance.  He states vomiting is nonbilious, nonbloody.  He has had a history of his gallbladder taken out.  He states occasionally, he will have some episodes where he throws up after eating but otherwise has not any issues.  He states he drinks about 3-4 beers a week.  He reports a couple years ago, he had a test that came back positive for hepatitis C.  He was sent to a specialist and stated that the test was negative so he never received any treatment.  He has not taken any Tylenol recently.  He denies any fevers, chest pain, difficulty breathing.  The history is provided by the patient.       Past Medical History:  Diagnosis Date  . Angioedema 03/28/2012   "tongue and throat"- was never known why"was taking humira at the time.  . Arthritis    "hands""back"  . Asthma    treated for over 49yr ago  . Chronic back pain     scoliosis/spondylosis  . Complication of anesthesia    may try fight when waking up  . History of bronchitis 10+yrs ago  . History of colon polyps   . History of gout    last time many yrs ago  . History of kidney stones   . Joint pain   . Joint swelling   . Lyme disease    just completed treatment with Doxycycline   . Pneumonia 08/2013  . Spondylitis, ankylosing ([facility]   . Weakness    and numbness in right leg    Patient Active Problem List   Diagnosis Date Noted  . Cholangitis 07/23/2020  . [place]) [place] 07/23/[place]  . History of hepatitis C 07/23/2020  . Atrial fibrillation with RVR ([facility] 07/23/2020  . Chronic pain 01/04/2014    Past Surgical History:  Procedure Laterality Date  . APPENDECTOMY    . CHOLECYSTECTOMY    . COLONOSCOPY    . ESOPHAGOGASTRODUODENOSCOPY    . knuckle surgery     right middle finger a couple of times  . LUMBAR LAMINECTOMY/DECOMPRESSION MICRODISCECTOMY Right 01/11/2013   Procedure: CENTRAL DECOMPRESSION LUMBAR LAMINECTOMYL4-L5 RIGHT, MICRODISCECTOMY L4-L5 RIGHT     (1 LEVEL);  Surgeon: RTobi Bastos MD;  Location: [facility];  Service: Orthopedics;  Laterality: Right;  . SEPTOPLASTY    . SHOULDER OPEN ROTATOR CUFF REPAIR Left  x 2;impingement syndrome and torn rotator cuff  . SPINAL CORD STIMULATOR INSERTION N/A 01/04/2014   Procedure: LUMBAR SPINAL CORD STIMULATOR INSERTION;  Surgeon: Melina Schools, MD;  Location: [facility];  Service: Orthopedics;  Laterality: N/A;  . TONSILLECTOMY         History reviewed. No pertinent family history.  Social History   Tobacco Use  . Smoking status: Current Some Day Smoker    Types: Cigars  . Smokeless tobacco: Never Used  . Tobacco comment: quit smoking cigarettes 10-88yr ago  Substance Use Topics  . Alcohol use: Yes    Comment: couple of beers a week  . Drug use: No    Home Medications Prior to Admission medications   Medication Sig Start Date End Date Taking? Authorizing  Provider  losartan (COZAAR) 100 MG tablet Take 100 mg by mouth daily. 06/27/20  Yes [provider]  metoprolol succinate (TOPROL-XL) 50 MG 24 hr tablet Take 50 mg by mouth daily. 06/05/20  Yes [provider]  OXYCONTIN 30 MG 12 hr tablet Take 1 tablet by mouth 2 (two) times daily. 07/11/20  Yes [provider]  SYMBICORT 160-4.5 MCG/ACT inhaler Inhale 2 puffs into the lungs in the morning and at bedtime. 07/03/20  Yes [provider]  XARELTO 20 MG TABS tablet Take 20 mg by mouth at bedtime. 07/03/20  Yes [provider]  albuterol (VENTOLIN HFA) 108 (90 Base) MCG/ACT inhaler Inhale 2 puffs into the lungs every 4 (four) hours as needed for wheezing or shortness of breath. 03/05/20   [provider]  docusate sodium (COLACE) 100 MG capsule Take 1 capsule (100 mg total) by mouth 2 (two) times daily. Patient not taking: No sig reported 01/05/14   OLanae Crumbly PA-C  ondansetron (ZOFRAN ODT) 4 MG disintegrating tablet Take 1 tablet (4 mg total) by mouth every 8 (eight) hours as needed. Patient not taking: No sig reported 01/05/14   OLanae Crumbly PA-C  oxyCODONE-acetaminophen (ROXICET) 5-325 MG per tablet Take 1 tablet by mouth every 6 (six) hours as needed for severe pain. Patient not taking: No sig reported 01/05/14   OLanae Crumbly PA-C  polyethylene glycol ([facility]/ GLYCOLAX) packet Take 17 g by mouth daily. Patient not taking: No sig reported 01/05/14   OLanae Crumbly PA-C    Allergies    Indocin [indomethacin] and Norco [hydrocodone-acetaminophen]  Review of Systems   Review of Systems  Constitutional: Negative for fever.  Respiratory: Negative for cough and shortness of breath.   Cardiovascular: Negative for chest pain.  Gastrointestinal: Positive for abdominal pain, nausea and vomiting.  Genitourinary: Negative for dysuria and hematuria.  Skin: Positive for color change.  Neurological: Negative for headaches.  All other systems  reviewed and are negative.   Physical Exam Updated Vital Signs BP (!) 152/106   Pulse (!) 120   Temp 98.2 F (36.8 C) (Oral)   Resp 20   Ht 5' 11"  (1.803 m)   Wt 90.3 kg   SpO2 94%   BMI 27.75 kg/m   Physical Exam Vitals and nursing note reviewed.  Constitutional:      Appearance: Normal appearance. He is well-developed and well-nourished.  HENT:     Head: Normocephalic and atraumatic.     Mouth/Throat:     Mouth: Oropharynx is clear and moist and mucous membranes are normal.  Eyes:     General: Lids are normal. Scleral icterus present.     Extraocular Movements: EOM normal.  Conjunctiva/sclera: Conjunctivae normal.     Pupils: Pupils are equal, round, and reactive to light.  Cardiovascular:     Rate and Rhythm: Normal rate and regular rhythm.     Pulses: Normal pulses.     Heart sounds: Normal heart sounds. No murmur heard. No friction rub. No gallop.   Pulmonary:     Effort: Pulmonary effort is normal.     Breath sounds: Normal breath sounds.     Comments: Lungs clear to auscultation bilaterally.  Symmetric chest rise.  No wheezing, rales, rhonchi. Abdominal:     Palpations: Abdomen is soft. Abdomen is not rigid.     Tenderness: There is abdominal tenderness in the right upper quadrant. There is no guarding.     Comments: Mild tenderness noted to the right upper quadrant.  No rigidity, guarding.  Musculoskeletal:        General: Normal range of motion.     Cervical back: Full passive range of motion without pain.  Skin:    General: Skin is warm and dry.     Capillary Refill: Capillary refill takes less than 2 seconds.  Neurological:     Mental Status: He is alert and oriented to person, place, and time.  Psychiatric:        Mood and Affect: Mood and affect normal.        Speech: Speech normal.     ED Results / Procedures / Treatments   Labs (all labs ordered are listed, but only abnormal results are displayed) Labs Reviewed  COMPREHENSIVE METABOLIC  PANEL - Abnormal; Notable for the following components:      Result Value   Chloride 93 (*)    Glucose, Bld 141 (*)    BUN 27 (*)    Creatinine, Ser 1.45 (*)    Albumin 3.3 (*)    AST 280 (*)    ALT 235 (*)    Alkaline Phosphatase 818 (*)    Total Bilirubin 17.9 (*)    GFR, Estimated 53 (*)    All other components within normal limits  CBC - Abnormal; Notable for the following components:   WBC 23.1 (*)    RBC 4.05 (*)    MCV 101.2 (*)    MCH 34.8 (*)    All other components within normal limits  URINALYSIS, ROUTINE W REFLEX MICROSCOPIC - Abnormal; Notable for the following components:   Color, Urine AMBER (*)    APPearance HAZY (*)    Bilirubin Urine MODERATE (*)    Protein, ur 100 (*)    Bacteria, UA RARE (*)    Non Squamous Epithelial 0-5 (*)    All other components within normal limits  CULTURE, BLOOD (ROUTINE X 2)  CULTURE, BLOOD (ROUTINE X 2)  RESP PANEL BY RT-PCR (FLU A&B, COVID) ARPGX2  LIPASE, BLOOD  HEPATITIS PANEL, ACUTE  LACTIC ACID, PLASMA  LACTIC ACID, PLASMA  PROCALCITONIN  PROTIME-INR  CBC WITH DIFFERENTIAL/PLATELET  MAGNESIUM  CK  SODIUM, URINE, RANDOM  CREATININE, URINE, RANDOM    EKG None  Radiology CT ABDOMEN PELVIS W CONTRAST  Addendum Date: 07/23/2020   ADDENDUM REPORT: 07/23/2020 20:46 ADDENDUM: These results were called by telephone at the time of interpretation on 07/23/2020 at 8:43 pm to provider [facility] , who verbally acknowledged these results. Electronically Signed   By: Fidela Salisbury MD   On: 07/23/2020 20:46   Result Date: 07/23/2020 CLINICAL DATA:  Right lower quadrant abdominal pain EXAM: CT ABDOMEN AND PELVIS WITH CONTRAST TECHNIQUE: Multidetector  CT imaging of the abdomen and pelvis was performed using the standard protocol following bolus administration of intravenous contrast. CONTRAST:  126m OMNIPAQUE IOHEXOL 300 MG/ML  SOLN COMPARISON:  12/02/2006 FINDINGS: Lower chest: The visualized lung bases are clear  bilaterally. Mild coronary artery calcification. Cardiac size is at the upper limits of normal. No pericardial effusion. Dorsal column stimulator leads extend superior to the upper margin of the examination. Hepatobiliary: Cholecystectomy has been performed. There is marked extrahepatic biliary ductal dilation with a 16 mm x 13 mm stone identified within the a distal duct in keeping with choledocholithiasis. Moderate periductal inflammatory stranding is present and there is enhancement of the biliary wall suggesting changes of superimposed cholangitis. No intrahepatic biliary ductal dilation. Mild to moderate hepatic steatosis. Portal vein is patent. Pancreas: Unremarkable Spleen: Unremarkable Adrenals/Urinary Tract: Adrenal glands are unremarkable. Kidneys are normal, without renal calculi, focal lesion, or hydronephrosis. Bladder is unremarkable. Stomach/Bowel: Severe sigmoid diverticulosis. Diverticulum noted arising from the gastric fundus. The stomach, small bowel, and large bowel are otherwise unremarkable. No evidence of obstruction or focal inflammation. Appendectomy has been performed. Small fat containing umbilical hernia. No free intraperitoneal gas or fluid. Vascular/Lymphatic: Moderate aortoiliac atherosclerotic calcification. No aortic aneurysm. No pathologic adenopathy within the abdomen and pelvis. Reproductive: Prostate is unremarkable. Other: Rectum unremarkable Musculoskeletal: Dorsal column stimulator battery pack noted within the left gluteal subcutaneous soft tissues. Degenerative changes are seen within the lumbar spine. No acute bone abnormality. IMPRESSION: Choledocholithiasis with a 16 mm x 13 mm calculus identified within the distal common duct. Marked extrahepatic biliary ductal dilation. Periductal inflammatory changes are also identified suggesting superimposed cholangitis. Aortic Atherosclerosis (ICD10-I70.0). Electronically Signed: By: AFidela SalisburyMD On: 07/23/2020 20:40   [place] Abdomen Limited RUQ (LIVER/GB)  Result Date: 07/23/2020 CLINICAL DATA:  Right upper quadrant abdominal pain EXAM: ULTRASOUND ABDOMEN LIMITED RIGHT UPPER QUADRANT COMPARISON:  07/19/2020, 12/20/2006 FINDINGS: Gallbladder: Absent Common bile duct: Diameter: Dilated measuring 12-13 mm in mid to distal diameter Liver: The liver parenchyma is diffusely heterogeneously increased in echogenicity and the echotexture is mildly coarsened in keeping with changes of probable moderate hepatic steatosis, stable since prior examination. There is no intrahepatic biliary ductal dilation identified. No intrahepatic mass lesion seen. Portal vein is patent on color Doppler imaging with normal direction of blood flow towards the liver. Other: No ascites is present. IMPRESSION: Marked dilation of the extrahepatic bile duct. This appears stable since immediate prior examination, though progressed since remote prior examination. While this may simply represent post cholecystectomy change, correlation with liver enzymes would be helpful to exclude an obstructive process. If abnormal, ERCP or MRCP examination would be more helpful to assess the distal duct. Moderate hepatic steatosis Status post cholecystectomy Electronically Signed   By: AFidela SalisburyMD   On: 07/23/2020 19:25    Procedures Procedures (including critical care time)  Medications Ordered in ED Medications  vancomycin (VANCOREADY) IVPB 2000 mg/400 mL (has no administration in time range)  piperacillin-tazobactam (ZOSYN) IVPB 3.375 g (has no administration in time range)  iohexol (OMNIPAQUE) 300 MG/ML solution 100 mL (100 mLs Intravenous Contrast Given 07/23/20 2018)  piperacillin-tazobactam (ZOSYN) IVPB 3.375 g (0 g Intravenous Stopped 07/23/20 2227)  sodium chloride 0.9 % bolus 1,000 mL (0 mLs Intravenous Stopped 07/23/20 2227)  ondansetron (ZOFRAN) injection 4 mg (4 mg Intravenous Given 07/23/20 2140)  fentaNYL (SUBLIMAZE) injection 100 mcg (100 mcg  Intravenous Given 07/23/20 2141)    ED Course  I have reviewed the triage vital signs and the  nursing notes.  Pertinent labs & imaging results that were available during my care of the patient were reviewed by me and considered in my medical decision making (see chart for details).    MDM Rules/Calculators/A&P                          69 year old male who presents for evaluation of right upper quadrant abdominal pain, nausea/vomiting as well as jaundice.  States abdominal pain has been intermittently occurring over the last week.  He saw his PCP who obtained lab work and noted abnormal LFTs.  He reports he had an ultrasound done 5 days ago that was normal.  On initial arrival, he is afebrile nontoxic-appearing.  Vital signs are stable.  On exam, he is some mild tenderness of the right upper quadrant as well as jaundice noted.  Labs ordered at triage.  Concern for intra-abdominal/hepatobiliary process.  Will obtain imaging.  He states he saw specialist when he thought he might of had hepatitis C but does not recall who it was.  He states that his colonoscopies are done by [facility].  He does not have a regular GI doctor that he sees.  Lipase is normal.  CBC shows leukocytosis of 23.1.  CMP shows BUN of 27, creatinine of 1.45.  He has elevations in his AST at 280, ALT of 235, alk phos of 818 as well as a total bili of 17.9.  Review of his record shows a an ultrasound done on 1210 that was unremarkable.  Findings were most consistent with hepatic steatosis.  We will add on acute hepatitis panel.  Ultrasound shows marked dilatation of the extrahepatic bile duct.  It has somewhat progressed since prior ultrasound done a few days ago. He has cholecystectomy.   CT abd/pelv shows choledocholithiasis with a 16 mm x 13 mm calculus identified with the distal common duct.  There is some marked biliary ductal dilatation concerning for ascending cholangitis as well as surrounding inflammatory  changes.  Updated patient on plan.  He is agreeable.  RN inform me of patient's tachycardia.  He has not been tachycardic previously.  He is reporting some increased pain which likely is contributing.  We will plan to give him analgesics, fluids.  Discussed patient with Dr. Roel Cluck (hospitalist) who accepts patient for admission.   Discussed patient with Dr. Cristina Gong (GI).  He will plan to consult on patient during admission.  He recommends that we keep patient n.p.o. after midnight.  Would like to hold patient's Xarelto.  Updated Dr. Roel Cluck on plan.  Portions of this note were generated with [employer]. Dictation errors may occur despite best attempts at proofreading.   Final Clinical Impression(s) / ED Diagnoses Final diagnoses:  Abdominal pain  Cholangitis    Rx / DC Orders ED Discharge Orders    None       Desma Mcgregor 07/23/20 2238    Milton Ferguson, MD 07/26/20 [phone]

## 2020-07-23 NOTE — Progress Notes (Signed)
A consult was received from an ED provider for Vancomycin per pharmacy dosing.  The patient's profile has been reviewed for ht/wt/allergies/indication/available labs.    A one time order has been placed for Vancomycin 2g IV.  Further antibiotics/pharmacy consults should be ordered by admitting physician if indicated.                       Thank you, Jamse Mead 07/23/2020  9:03 PM

## 2020-07-23 NOTE — ED Notes (Addendum)
Patient just got back from testing and vitals were updated.

## 2020-07-23 NOTE — Progress Notes (Signed)
Pharmacy Antibiotic Note  Justin [name] is a 69 y.o. male admitted on 07/23/2020 with past  history of asthma, chronic back pain, Lyme disease who presents for evaluation of right upper quadrant abdominal pain, nausea/vomiting, jaundice  Pharmacy has been consulted for zosyn dosing.  Plan: Zosyn 3.375g IV Q8H infused over 4hrs. Pharmacy will sign off as renal adjustment unlikely, will follow peripherally   Height: 5\' 11"  (180.3 cm) Weight: 90.3 kg (199 lb) IBW/kg (Calculated) : 75.3  Temp (24hrs), Avg:98.1 F (36.7 C), Min:97.5 F (36.4 C), Max:98.7 F (37.1 C)  Recent Labs  Lab 07/23/20 1537  WBC 23.1*  CREATININE 1.45*    Estimated Creatinine Clearance: 52.7 mL/min (A) (by C-G formula based on SCr of 1.45 mg/dL (H)).    Allergies  Allergen Reactions  . Indocin [Indomethacin] Other (See Comments)    Severe headaches.   07/25/20 [Hydrocodone-Acetaminophen] Nausea And Vomiting     Thank you for allowing pharmacy to be a part of this patient's care.  Awanda Mink RPh 07/23/2020, 10:14 PM

## 2020-07-23 NOTE — ED Notes (Signed)
Off floor for scan

## 2020-07-24 ENCOUNTER — Inpatient Hospital Stay (HOSPITAL_COMMUNITY): Payer: Medicare Other

## 2020-07-24 ENCOUNTER — Ambulatory Visit (HOSPITAL_COMMUNITY): Payer: Medicare Other

## 2020-07-24 DIAGNOSIS — N179 Acute kidney failure, unspecified: Secondary | ICD-10-CM

## 2020-07-24 DIAGNOSIS — R1011 Right upper quadrant pain: Secondary | ICD-10-CM | POA: Insufficient documentation

## 2020-07-24 DIAGNOSIS — I4891 Unspecified atrial fibrillation: Secondary | ICD-10-CM

## 2020-07-24 DIAGNOSIS — G8929 Other chronic pain: Secondary | ICD-10-CM

## 2020-07-24 DIAGNOSIS — K8309 Other cholangitis: Secondary | ICD-10-CM

## 2020-07-24 LAB — COMPREHENSIVE METABOLIC PANEL
ALT: 261 U/L — ABNORMAL HIGH (ref 0–44)
AST: 405 U/L — ABNORMAL HIGH (ref 15–41)
Albumin: 2.7 g/dL — ABNORMAL LOW (ref 3.5–5.0)
Alkaline Phosphatase: 712 U/L — ABNORMAL HIGH (ref 38–126)
Anion gap: 14 (ref 5–15)
BUN: 31 mg/dL — ABNORMAL HIGH (ref 8–23)
CO2: 25 mmol/L (ref 22–32)
Calcium: 8.2 mg/dL — ABNORMAL LOW (ref 8.9–10.3)
Chloride: 98 mmol/L (ref 98–111)
Creatinine, Ser: 0.82 mg/dL (ref 0.61–1.24)
GFR, Estimated: >60 mL/min (ref >60)
Glucose, Bld: 101 mg/dL — ABNORMAL HIGH (ref 70–99)
Potassium: 4.4 mmol/L (ref 3.5–5.1)
Sodium: 137 mmol/L (ref 135–145)
Total Bilirubin: 14.7 mg/dL — ABNORMAL HIGH (ref 0.3–1.2)
Total Protein: 6.1 g/dL — ABNORMAL LOW (ref 6.5–8.1)

## 2020-07-24 LAB — PROTIME-INR
INR: 1.1 (ref 0.8–1.2)
Prothrombin Time: 14.2 seconds (ref 11.4–15.2)

## 2020-07-24 LAB — MAGNESIUM
Magnesium: 1.7 mg/dL (ref 1.7–2.4)
Magnesium: 1.7 mg/dL (ref 1.7–2.4)

## 2020-07-24 LAB — TSH: TSH: 0.745 u[IU]/mL (ref 0.350–4.500)

## 2020-07-24 LAB — CBC WITH DIFFERENTIAL/PLATELET
Abs Immature Granulocytes: 0.07 10*3/uL (ref 0.00–0.07)
Basophils Absolute: 0 10*3/uL (ref 0.0–0.1)
Basophils Relative: 0 %
Eosinophils Absolute: 0 10*3/uL (ref 0.0–0.5)
Eosinophils Relative: 0 %
HCT: 35.1 % — ABNORMAL LOW (ref 39.0–52.0)
Hemoglobin: 11.9 g/dL — ABNORMAL LOW (ref 13.0–17.0)
Immature Granulocytes: 1 %
Lymphocytes Relative: 4 %
Lymphs Abs: 0.6 10*3/uL — ABNORMAL LOW (ref 0.7–4.0)
MCH: 34.6 pg — ABNORMAL HIGH (ref 26.0–34.0)
MCHC: 33.9 g/dL (ref 30.0–36.0)
MCV: 102 fL — ABNORMAL HIGH (ref 80.0–100.0)
Monocytes Absolute: 0.9 10*3/uL (ref 0.1–1.0)
Monocytes Relative: 6 %
Neutro Abs: 13.3 10*3/uL — ABNORMAL HIGH (ref 1.7–7.7)
Neutrophils Relative %: 89 %
Platelets: 281 10*3/uL (ref 150–400)
RBC: 3.44 MIL/uL — ABNORMAL LOW (ref 4.22–5.81)
RDW: 15.9 % — ABNORMAL HIGH (ref 11.5–15.5)
WBC: 15 10*3/uL — ABNORMAL HIGH (ref 4.0–10.5)
nRBC: 0 % (ref 0.0–0.2)

## 2020-07-24 LAB — CREATININE, URINE, RANDOM: Creatinine, Urine: 128.15 mg/dL

## 2020-07-24 LAB — LACTIC ACID, PLASMA
Lactic Acid, Venous: 1 mmol/L (ref 0.5–1.9)
Lactic Acid, Venous: 1.5 mmol/L (ref 0.5–1.9)

## 2020-07-24 LAB — CK: Total CK: 84 U/L (ref 49–397)

## 2020-07-24 LAB — PHOSPHORUS: Phosphorus: 3.9 mg/dL (ref 2.5–4.6)

## 2020-07-24 LAB — PROCALCITONIN: Procalcitonin: 6.81 ng/mL

## 2020-07-24 LAB — SODIUM, URINE, RANDOM: Sodium, Ur: 33 mmol/L

## 2020-07-24 LAB — HIV ANTIBODY (ROUTINE TESTING W REFLEX): HIV Screen 4th Generation wRfx: NONREACTIVE

## 2020-07-24 LAB — MRSA PCR SCREENING: MRSA by PCR: NEGATIVE

## 2020-07-24 MED ORDER — SODIUM CHLORIDE 0.9 % IV SOLN
75.0000 mL/h | INTRAVENOUS | Status: AC
  Administered 2020-07-24: 11:00:00 75 mL/h via INTRAVENOUS

## 2020-07-24 MED ORDER — ONDANSETRON HCL 4 MG/2ML IJ SOLN
4.0000 mg | Freq: Four times a day (QID) | INTRAMUSCULAR | Status: DC | PRN
  Administered 2020-07-24: 13:00:00 4 mg via INTRAVENOUS
  Filled 2020-07-24: qty 2

## 2020-07-24 MED ORDER — CHLORHEXIDINE GLUCONATE CLOTH 2 % EX PADS
6.0000 | MEDICATED_PAD | Freq: Every day | CUTANEOUS | Status: DC
  Administered 2020-07-24 – 2020-07-25 (×2): 6 via TOPICAL

## 2020-07-24 MED ORDER — ORAL CARE MOUTH RINSE
15.0000 mL | Freq: Two times a day (BID) | OROMUCOSAL | Status: DC
  Administered 2020-07-24 – 2020-07-26 (×4): 15 mL via OROMUCOSAL

## 2020-07-24 MED ORDER — ORAL CARE MOUTH RINSE: 15.0000 mL | Freq: Two times a day (BID) | OROMUCOSAL | Status: DC

## 2020-07-24 MED ORDER — METOPROLOL TARTRATE 25 MG PO TABS
50.0000 mg | ORAL_TABLET | Freq: Once | ORAL | Status: AC
  Administered 2020-07-24: 15:00:00 50 mg via ORAL
  Filled 2020-07-24: qty 2

## 2020-07-24 NOTE — ED Notes (Signed)
MD contacted again regarding pt's HR for echo. Repeat EKG done to verify rhythm of continued a-fib. MD and cardiology aware. Cardiology to perform echo tomorrow pending rate control below 120.

## 2020-07-24 NOTE — ED Notes (Signed)
Provider at bedside

## 2020-07-24 NOTE — Consult Note (Signed)
Referring Provider: ED Primary Care Physician:  Charlane FerrettiSkakle, [place], DO Primary Gastroenterologist:  Gentry FitzUnassigned  Reason for Consultation:  Choledocholithiasis, suspected cholangitis  HPI: Launa GrillLarry W Zammit is a 69 y.o. male with history of A fib (on Xarelto), Hepatitis C, and remote cholecystectomy presenting for consultation choledocholithiasis with suspected cholangitis.  Patient reports intermittent right upper quadrant abdominal pain beginning on Monday 12/6.  He has also had nausea and nonbloody, nonbilious emesis.  Denies any symptoms prior to 12/6.  He states that the pain did not seem aggravated by eating, and that food actually seem to make the pain better.  Reports reflux, which occasionally makes him vomit (approximately 2-3 times per month).  He is not on any PPI therapy.  Denies any dysphagia, changes in appetite, early satiety, unexplained weight loss.  Denies any changes in stool, denies diarrhea, constipation, melena, or hematochezia.  States his last colonoscopy was 2 to 3 years ago at [facility], though he states he is not currently established with them.  He takes Xarelto, last dose 12/13.  Denies aspirin/NSAID use.  Family history pertinent for sister, deceased of pancreatic cancer.  Of note, patient had ERCP in 2008 for abnormal IOC and suspected bile leak post-cholecystectomy.  Probable small stone, though motion limited the study.  Sphincterotomy not attempted.  Large periampullary diverticulum noted.    Past Medical History:  Diagnosis Date  . Angioedema 03/28/2012   "tongue and throat"- was never known why"was taking humira at the time.  . Arthritis    "hands""back"  . Asthma    treated for over 6468yrs ago  . Chronic back pain    scoliosis/spondylosis  . Complication of anesthesia    may try fight when waking up  . History of bronchitis 10+yrs ago  . History of colon polyps   . History of gout    last time many yrs ago  . History of kidney stones   . Joint  pain   . Joint swelling   . Lyme disease    just completed treatment with Doxycycline   . Pneumonia 08/2013  . Spondylitis, ankylosing (HCC)   . Weakness    and numbness in right leg    Past Surgical History:  Procedure Laterality Date  . APPENDECTOMY    . CHOLECYSTECTOMY    . COLONOSCOPY    . ESOPHAGOGASTRODUODENOSCOPY    . knuckle surgery     right middle finger a couple of times  . LUMBAR LAMINECTOMY/DECOMPRESSION MICRODISCECTOMY Right 01/11/2013   Procedure: CENTRAL DECOMPRESSION LUMBAR LAMINECTOMYL4-L5 RIGHT, MICRODISCECTOMY L4-L5 RIGHT     (1 LEVEL);  Surgeon: Jacki Conesonald A Gioffre, MD;  Location: [facility];  Service: Orthopedics;  Laterality: Right;  . SEPTOPLASTY    . SHOULDER OPEN ROTATOR CUFF REPAIR Left    x 2;impingement syndrome and torn rotator cuff  . SPINAL CORD STIMULATOR INSERTION N/A 01/04/2014   Procedure: LUMBAR SPINAL CORD STIMULATOR INSERTION;  Surgeon: Venita Lickahari Brooks, MD;  Location: [facility];  Service: Orthopedics;  Laterality: N/A;  . TONSILLECTOMY      Prior to Admission medications   Medication Sig Start Date End Date Taking? Authorizing Provider  losartan (COZAAR) 100 MG tablet Take 100 mg by mouth daily. 06/27/20  Yes [provider]  metoprolol succinate (TOPROL-XL) 50 MG 24 hr tablet Take 50 mg by mouth daily. 06/05/20  Yes [provider]  OXYCONTIN 30 MG 12 hr tablet Take 1 tablet by mouth 2 (two) times daily. 07/11/20  Yes [provider]  SYMBICORT 160-4.5  MCG/ACT inhaler Inhale 2 puffs into the lungs in the morning and at bedtime. 07/03/20  Yes [provider]  XARELTO 20 MG TABS tablet Take 20 mg by mouth at bedtime. 07/03/20  Yes [provider]  albuterol (VENTOLIN HFA) 108 (90 Base) MCG/ACT inhaler Inhale 2 puffs into the lungs every 4 (four) hours as needed for wheezing or shortness of breath. 03/05/20   [provider]  docusate sodium (COLACE) 100 MG capsule Take 1 capsule (100 mg total) by mouth 2  (two) times daily. Patient not taking: No sig reported 01/05/14   Naida Sleight, PA-C  ondansetron (ZOFRAN ODT) 4 MG disintegrating tablet Take 1 tablet (4 mg total) by mouth every 8 (eight) hours as needed. Patient not taking: No sig reported 01/05/14   Naida Sleight, PA-C  oxyCODONE-acetaminophen (ROXICET) 5-325 MG per tablet Take 1 tablet by mouth every 6 (six) hours as needed for severe pain. Patient not taking: No sig reported 01/05/14   Naida Sleight, PA-C  polyethylene glycol [facility] / GLYCOLAX) packet Take 17 g by mouth daily. Patient not taking: No sig reported 01/05/14   Naida Sleight, PA-C    Scheduled Meds: . metoprolol succinate  50 mg Oral Daily  . mometasone-formoterol  2 puff Inhalation BID   Continuous Infusions: . sodium chloride 75 mL/hr (07/23/20 2348)  . sodium chloride Stopped (07/23/20 2348)  . piperacillin-tazobactam (ZOSYN)  IV 3.375 g (07/24/20 0538)   PRN Meds:.acetaminophen, albuterol, HYDROmorphone (DILAUDID) injection  Allergies as of 07/23/2020 - Review Complete 07/23/2020  Allergen Reaction Noted  . Indocin [indomethacin] Other (See Comments) 03/28/2012  . Norco [hydrocodone-acetaminophen] Nausea And Vomiting 12/20/2013    Family History  Problem Relation Age of Onset  . Hypertension Other     Social History   Socioeconomic History  . Marital status: Married    Spouse name: Not on file  . Number of children: Not on file  . Years of education: Not on file  . Highest education level: Not on file  Occupational History  . Not on file  Tobacco Use  . Smoking status: Current Some Day Smoker    Types: Cigars  . Smokeless tobacco: Never Used  . Tobacco comment: quit smoking cigarettes 10-63yrs ago  Substance and Sexual Activity  . Alcohol use: Yes    Comment: couple of beers a week  . Drug use: No  . Sexual activity: Yes  Other Topics Concern  . Not on file  Social History Narrative  . Not on file   Social Determinants of Health    Financial Resource Strain: Not on file  Food Insecurity: Not on file  Transportation Needs: Not on file  Physical Activity: Not on file  Stress: Not on file  Social Connections: Not on file  Intimate Partner Violence: Not on file    Review of Systems: Review of Systems  Constitutional: Negative for chills, fever and weight loss.  HENT: Positive for hearing loss. Negative for tinnitus.   Eyes: Negative for pain and redness.  Respiratory: Negative for cough and shortness of breath.   Cardiovascular: Negative for chest pain and palpitations.  Gastrointestinal: Positive for abdominal pain, heartburn, nausea and vomiting. Negative for blood in stool, constipation, diarrhea and melena.  Genitourinary: Negative for flank pain and hematuria.  Musculoskeletal: Negative for falls and myalgias.  Skin: Negative for itching and rash.  Neurological: Negative for seizures and loss of consciousness.  Endo/Heme/Allergies: Negative for polydipsia. Does not bruise/bleed easily.  Psychiatric/Behavioral: Negative  for substance abuse. The patient is not nervous/anxious.      Physical Exam: Vital signs: Vitals:   07/24/20 0705 07/24/20 0706  BP:  121/69  Pulse: (!) 117 (!) 128  Resp:  18  Temp:    SpO2:  95%     Physical Exam Vitals reviewed.  Constitutional:      General: He is not in acute distress.    Appearance: He is overweight.  HENT:     Head: Normocephalic and atraumatic.     Nose: Nose normal. No congestion.     Mouth/Throat:     Mouth: Mucous membranes are moist.     Pharynx: Oropharynx is clear.  Eyes:     General: Scleral icterus present.     Extraocular Movements: Extraocular movements intact.  Cardiovascular:     Rate and Rhythm: Tachycardia present. Rhythm irregular.     Pulses: Normal pulses.  Pulmonary:     Effort: Pulmonary effort is normal. No respiratory distress.     Breath sounds: Normal breath sounds.  Abdominal:     General: Bowel sounds are normal. There  is no distension.     Palpations: Abdomen is soft. There is no mass.     Tenderness: There is abdominal tenderness (moderate, RUQ). There is guarding. There is no rebound.     Hernia: No hernia is present.  Musculoskeletal:        General: No swelling or tenderness.     Cervical back: Normal range of motion and neck supple.  Skin:    General: Skin is warm and dry.     Coloration: Skin is jaundiced.  Neurological:     General: No focal deficit present.     Mental Status: He is alert and oriented to person, place, and time.  Psychiatric:        Mood and Affect: Mood normal.        Behavior: Behavior normal.     GI:  Lab Results: Recent Labs    07/23/20 1537 07/24/20 0038  WBC 23.1* 15.0*  HGB 14.1 11.9*  HCT 41.0 35.1*  PLT 302 281   BMET Recent Labs    07/23/20 1537 07/24/20 0038  NA 137 137  K 3.9 4.4  CL 93* 98  CO2 30 25  GLUCOSE 141* 101*  BUN 27* 31*  CREATININE 1.45* 0.82  CALCIUM 9.3 8.2*   LFT Recent Labs    07/24/20 0038  PROT 6.1*  ALBUMIN 2.7*  AST 405*  ALT 261*  ALKPHOS 712*  BILITOT 14.7*   PT/INR Recent Labs    07/23/20 2317  LABPROT 14.2  INR 1.1     Studies/Results: CT ABDOMEN PELVIS W CONTRAST  Addendum Date: 07/23/2020   ADDENDUM REPORT: 07/23/2020 20:46 ADDENDUM: These results were called by telephone at the time of interpretation on 07/23/2020 at 8:43 pm to provider [facility] , who verbally acknowledged these results. Electronically Signed   By: Helyn Numbers MD   On: 07/23/2020 20:46   Result Date: 07/23/2020 CLINICAL DATA:  Right lower quadrant abdominal pain EXAM: CT ABDOMEN AND PELVIS WITH CONTRAST TECHNIQUE: Multidetector CT imaging of the abdomen and pelvis was performed using the standard protocol following bolus administration of intravenous contrast. CONTRAST:  OMNIPAQUE IOHEXOL 300 MG/ML  SOLN COMPARISON:  12/02/2006 FINDINGS: Lower chest: The visualized lung bases are clear bilaterally. Mild coronary  artery calcification. Cardiac size is at the upper limits of normal. No pericardial effusion. Dorsal column stimulator leads extend superior to the  upper margin of the examination. Hepatobiliary: Cholecystectomy has been performed. There is marked extrahepatic biliary ductal dilation with a 16 mm x 13 mm stone identified within the a distal duct in keeping with choledocholithiasis. Moderate periductal inflammatory stranding is present and there is enhancement of the biliary wall suggesting changes of superimposed cholangitis. No intrahepatic biliary ductal dilation. Mild to moderate hepatic steatosis. Portal vein is patent. Pancreas: Unremarkable Spleen: Unremarkable Adrenals/Urinary Tract: Adrenal glands are unremarkable. Kidneys are normal, without renal calculi, focal lesion, or hydronephrosis. Bladder is unremarkable. Stomach/Bowel: Severe sigmoid diverticulosis. Diverticulum noted arising from the gastric fundus. The stomach, small bowel, and large bowel are otherwise unremarkable. No evidence of obstruction or focal inflammation. Appendectomy has been performed. Small fat containing umbilical hernia. No free intraperitoneal gas or fluid. Vascular/Lymphatic: Moderate aortoiliac atherosclerotic calcification. No aortic aneurysm. No pathologic adenopathy within the abdomen and pelvis. Reproductive: Prostate is unremarkable. Other: Rectum unremarkable Musculoskeletal: Dorsal column stimulator battery pack noted within the left gluteal subcutaneous soft tissues. Degenerative changes are seen within the lumbar spine. No acute bone abnormality. IMPRESSION: Choledocholithiasis with a 16 mm x 13 mm calculus identified within the distal common duct. Marked extrahepatic biliary ductal dilation. Periductal inflammatory changes are also identified suggesting superimposed cholangitis. Aortic Atherosclerosis (ICD10-I70.0). Electronically Signed: By: Helyn Numbers MD On: 07/23/2020 20:40   US Abdomen Limited RUQ  (LIVER/GB)  Result Date: 07/23/2020 CLINICAL DATA:  Right upper quadrant abdominal pain EXAM: ULTRASOUND ABDOMEN LIMITED RIGHT UPPER QUADRANT COMPARISON:  07/19/2020, 12/20/2006 FINDINGS: Gallbladder: Absent Common bile duct: Diameter: Dilated measuring 12-13 mm in mid to distal diameter Liver: The liver parenchyma is diffusely heterogeneously increased in echogenicity and the echotexture is mildly coarsened in keeping with changes of probable moderate hepatic steatosis, stable since prior examination. There is no intrahepatic biliary ductal dilation identified. No intrahepatic mass lesion seen. Portal vein is patent on color Doppler imaging with normal direction of blood flow towards the liver. Other: No ascites is present. IMPRESSION: Marked dilation of the extrahepatic bile duct. This appears stable since immediate prior examination, though progressed since remote prior examination. While this may simply represent post cholecystectomy change, correlation with liver enzymes would be helpful to exclude an obstructive process. If abnormal, ERCP or MRCP examination would be more helpful to assess the distal duct. Moderate hepatic steatosis Status post cholecystectomy Electronically Signed   By: Helyn Numbers MD   On: 07/23/2020 19:25    Impression: Choledocholithiasis and suspected cholangitis. Currently receiving IV Zosyn. -De novo CBD stone.  History of remote cholecystectomy (2008) -T bili 14.7/AST 405/ALT 261/ALP 712, improved from yesterday T bili 17.9/AST 280/ALT 235/ALP 818 -WBCs 15.0 today, improved from 23.1 yesterday -CT 07/23/20: 16 mm x 13 mm distal CBD stone, marked extrahepatic biliary ductal dilation. Periductal inflammatory changes suggesting cholangitis.  A fib, on Xarelto, last dose 12/13 at 6PM  History of Hepatitis C, HCV Ab positive 12/14.  Quantitative PCR pending.  Plan: ERCP today with Dr. Ewing Schlein.  Discussed the procedure with the patient to include nature, alternatives,  benefits, and risks (including but not limited to pancreatitis, bleeding, infection, perforation, anesthesia/cardiac and pulmonary complications).  Patient verbalized understanding and gave verbal consent to proceed with ERCP.  Keep patient NPO until post procedure.    Continue IV Zosyn.  Continue supportive care.  If HCV quantitative is positive, recommend outpatient treatment.  [facility] will follow.    LOS: 1 day   Edrick Kins  PA-C 07/24/2020, 8:52 AM  Contact #  [phone]

## 2020-07-24 NOTE — Progress Notes (Signed)
PROGRESS NOTE    Justin [name]  ZOX:[id number] DOB: [birth year] DOA: 07/23/2020 PCP: Sueanne Margarita, DO   Brief Narrative: Taken from H&P Justin [name] is a 69 y.o. male with medical history significant of A. fib on Eliquis, COPD on Symbicort and albuterol as needed, chronic back pain and ankylosing spondylitis on opioids who has a history of elevated LFTs and a history of hepatitis C exposure although RNA is negative , sp chocystectomy, Presented with 1 week history of right upper quadrant pain, found to have worsening transaminitis, and T bili. Patient also recently started some Chinese supplement for his breathing issues. Also having some nausea and vomiting.  CT dominant with concern of CBD extrahepatic bile duct.  Concern of cholangitis.  He was started on Zosyn and GI was consulted, will be going for ERCP later today.  Subjective: Patient continued to have some nausea and abdominal pain.  Denies any fever or chills.  Assessment & Plan:   Active Problems:   Chronic pain   Cholangitis   AKI (acute kidney injury) ([place])   History of hepatitis C   Atrial fibrillation with RVR ([facility])  Sepsis secondary to cholangitis.  Initially met sepsis criteria, most likely secondary to cholangitis.  Procalcitonin elevated at 6.81.  T bili at 14.7 with alkaline phosphatase of 712, AST 405 and ALT 281.  Significant CBD dilatation on CT abdomen and RUQ ultrasound.  Blood cultures negative in 12 hours. GI was consulted and he is going for MRCP later today. -Continue with Zosyn. -Supportive care with pain and nausea management.  AKI.  Resolved with IV fluid.  Most likely secondary to sepsis. -Continue to monitor  Paroxysmal atrial fibrillation with RVR.  Heart rate with some improvement with IV fluid. -Continue home dose of metoprolol -Home dose of Eliquis is being held for possible procedure.  History of hep C.  Hep C RNA quantitative studies are pending. -We will need outpatient  follow-up.  Chronic pain.  Home meds will be resumed after the procedure.  Objective: Vitals:   07/24/20 0705 07/24/20 0706 07/24/20 1015 07/24/20 1130  BP:  121/69 (!) 129/98 128/88  Pulse: (!) 117 (!) 128 60 91  Resp:  18 20 (!) 22  Temp:      TempSrc:      SpO2:  95% 95% 91%  Weight:      Height:       No intake or output data in the 24 hours ending 07/24/20 1304 Filed Weights   07/23/20 2056  Weight: 90.3 kg    Examination:  General exam: Appears calm and comfortable  Respiratory system: Clear to auscultation. Respiratory effort normal. Cardiovascular system: S1 & S2 heard, RRR.  Gastrointestinal system: Soft, mild R UQ tenderness, nondistended, bowel sounds positive. Central nervous system: Alert and oriented. No focal neurological deficits.Symmetric 5 x 5 power. Extremities: No edema, no cyanosis, pulses intact and symmetrical. Skin: No rashes, lesions or ulcers Psychiatry: Judgement and insight appear normal. Mood & affect appropriate.    DVT prophylaxis: SCDs, will resume Eliquis once cleared from GI after MRCP. Code Status: Full Family Communication: Discussed with patient Disposition Plan:  Status is: Inpatient  Remains inpatient appropriate because:Inpatient level of care appropriate due to severity of illness   Dispo: The patient is from: Home              Anticipated d/c is to: Home              Anticipated d/c date is: 2  days              Patient currently is not medically stable to d/c.   Consultants:   GI  Procedures:  Antimicrobials:  Zosyn  Data Reviewed: I have personally reviewed following labs and imaging studies  CBC: Recent Labs  Lab 07/23/20 1537 07/24/20 0038  WBC 23.1* 15.0*  NEUTROABS  --  13.3*  HGB 14.1 11.9*  HCT 41.0 35.1*  MCV 101.2* 102.0*  PLT 302 956   Basic Metabolic Panel: Recent Labs  Lab 07/23/20 1537 07/23/20 2230 07/24/20 0038  NA 137  --  137  K 3.9  --  4.4  CL 93*  --  98  CO2 30  --  25   GLUCOSE 141*  --  101*  BUN 27*  --  31*  CREATININE 1.45*  --  0.82  CALCIUM 9.3  --  8.2*  MG  --  1.7 1.7  PHOS  --   --  3.9   GFR: Estimated Creatinine Clearance: 93.1 mL/min (by C-G formula based on SCr of 0.82 mg/dL). Liver Function Tests: Recent Labs  Lab 07/23/20 1537 07/24/20 0038  AST 280* 405*  ALT 235* 261*  ALKPHOS 818* 712*  BILITOT 17.9* 14.7*  PROT 7.8 6.1*  ALBUMIN 3.3* 2.7*   Recent Labs  Lab 07/23/20 1537  LIPASE 21   No results for input(s): AMMONIA in the last 168 hours. Coagulation Profile: Recent Labs  Lab 07/23/20 2317  INR 1.1   Cardiac Enzymes: Recent Labs  Lab 07/23/20 2230  CKTOTAL 84   BNP (last 3 results) No results for input(s): PROBNP in the last 8760 hours. HbA1C: No results for input(s): HGBA1C in the last 72 hours. CBG: No results for input(s): GLUCAP in the last 168 hours. Lipid Profile: No results for input(s): CHOL, HDL, LDLCALC, TRIG, CHOLHDL, LDLDIRECT in the last 72 hours. Thyroid Function Tests: Recent Labs    07/24/20 0416  TSH 0.745   Anemia Panel: No results for input(s): VITAMINB12, FOLATE, FERRITIN, TIBC, IRON, RETICCTPCT in the last 72 hours. Sepsis Labs: Recent Labs  Lab 07/23/20 2317 07/24/20 0038  PROCALCITON  --  6.81  LATICACIDVEN 1.5 1.0    Recent Results (from the past 240 hour(s))  Blood culture (routine x 2)     Status: None (Preliminary result)   Collection Time: 07/23/20  9:27 PM   Specimen: BLOOD RIGHT FOREARM  Result Value Ref Range Status   Specimen Description   Final    BLOOD RIGHT FOREARM Performed at [facility] [address], [place], [place] [address]    Special Requests   Final    BOTTLES DRAWN AEROBIC AND ANAEROBIC Blood Culture adequate volume Performed at [facility] [address] [place], [place] [address]    Culture   Final    NO GROWTH < 12 HOURS Performed at [facility] [address], [place], [place]  [address]    Report Status PENDING  Incomplete  Blood culture (routine x 2)     Status: None (Preliminary result)   Collection Time: 07/23/20  9:27 PM   Specimen: BLOOD LEFT FOREARM  Result Value Ref Range Status   Specimen Description   Final    BLOOD LEFT FOREARM Performed at [facility] [address], [place], [place] [address]    Special Requests   Final    BOTTLES DRAWN AEROBIC AND ANAEROBIC Blood Culture adequate volume Performed at [facility]  [facility], [place] [address] [place], [place] [address]    Culture   Final    NO GROWTH < 12 HOURS Performed at [facility] [address] [place], [place] [address]    Report Status PENDING  Incomplete  Resp Panel by RT-PCR (Flu A&B, Covid) Nasopharyngeal Swab     Status: None   Collection Time: 07/23/20  9:27 PM   Specimen: Nasopharyngeal Swab; Nasopharyngeal(NP) swabs in vial transport medium  Result Value Ref Range Status   SARS Coronavirus 2 by RT PCR NEGATIVE NEGATIVE Final    Comment: (NOTE) SARS-CoV-2 target nucleic acids are NOT DETECTED.  The SARS-CoV-2 RNA is generally detectable in upper respiratory specimens during the acute phase of infection. The lowest concentration of SARS-CoV-2 viral copies this assay can detect is 138 copies/mL. A negative result does not preclude SARS-Cov-2 infection and should not be used as the sole basis for treatment or other patient management decisions. A negative result may occur with  improper specimen collection/handling, submission of specimen other than nasopharyngeal swab, presence of viral mutation(s) within the areas targeted by this assay, and inadequate number of viral copies(<138 copies/mL). A negative result must be combined with clinical observations, patient history, and epidemiological information. The expected result is Negative.  Fact Sheet for Patients:  EntrepreneurPulse.com.au  Fact Sheet for Healthcare Providers:   IncredibleEmployment.be  This test is no t yet approved or cleared by the [place] FDA and  has been authorized for detection and/or diagnosis of SARS-CoV-2 by FDA under an Emergency Use Authorization (EUA). This EUA will remain  in effect (meaning this test can be used) for the duration of the COVID-19 declaration under Section 564(b)(1) of the Act, 21 U.S.C.section 360bbb-3(b)(1), unless the authorization is terminated  or revoked sooner.       Influenza A by PCR NEGATIVE NEGATIVE Final   Influenza B by PCR NEGATIVE NEGATIVE Final    Comment: (NOTE) The Xpert Xpress SARS-CoV-2/FLU/RSV plus assay is intended as an aid in the diagnosis of influenza from Nasopharyngeal swab specimens and should not be used as a sole basis for treatment. Nasal washings and aspirates are unacceptable for Xpert Xpress SARS-CoV-2/FLU/RSV testing.  Fact Sheet for Patients: EntrepreneurPulse.com.au  Fact Sheet for Healthcare Providers: IncredibleEmployment.be  This test is not yet approved or cleared by the [place] FDA and has been authorized for detection and/or diagnosis of SARS-CoV-2 by FDA under an Emergency Use Authorization (EUA). This EUA will remain in effect (meaning this test can be used) for the duration of the COVID-19 declaration under Section 564(b)(1) of the Act, 21 U.S.C. section 360bbb-3(b)(1), unless the authorization is terminated or revoked.  Performed at [facility], [place] [address] [place],  [address]      Radiology Studies: CT ABDOMEN PELVIS W CONTRAST  Addendum Date: 07/23/2020   ADDENDUM REPORT: 07/23/2020 20:46 ADDENDUM: These results were called by telephone at the time of interpretation on 07/23/2020 at 8:43 pm to provider [facility] , who verbally acknowledged these results. Electronically Signed   By: Fidela Salisbury MD   On: 07/23/2020 20:46   Result Date:  07/23/2020 CLINICAL DATA:  Right lower quadrant abdominal pain EXAM: CT ABDOMEN AND PELVIS WITH CONTRAST TECHNIQUE: Multidetector CT imaging of the abdomen and pelvis was performed using the standard protocol following bolus administration of intravenous contrast. CONTRAST:  166m OMNIPAQUE IOHEXOL 300 MG/ML  SOLN COMPARISON:  12/02/2006 FINDINGS: Lower chest: The visualized lung bases are clear bilaterally. Mild coronary artery  calcification. Cardiac size is at the upper limits of normal. No pericardial effusion. Dorsal column stimulator leads extend superior to the upper margin of the examination. Hepatobiliary: Cholecystectomy has been performed. There is marked extrahepatic biliary ductal dilation with a 16 mm x 13 mm stone identified within the a distal duct in keeping with choledocholithiasis. Moderate periductal inflammatory stranding is present and there is enhancement of the biliary wall suggesting changes of superimposed cholangitis. No intrahepatic biliary ductal dilation. Mild to moderate hepatic steatosis. Portal vein is patent. Pancreas: Unremarkable Spleen: Unremarkable Adrenals/Urinary Tract: Adrenal glands are unremarkable. Kidneys are normal, without renal calculi, focal lesion, or hydronephrosis. Bladder is unremarkable. Stomach/Bowel: Severe sigmoid diverticulosis. Diverticulum noted arising from the gastric fundus. The stomach, small bowel, and large bowel are otherwise unremarkable. No evidence of obstruction or focal inflammation. Appendectomy has been performed. Small fat containing umbilical hernia. No free intraperitoneal gas or fluid. Vascular/Lymphatic: Moderate aortoiliac atherosclerotic calcification. No aortic aneurysm. No pathologic adenopathy within the abdomen and pelvis. Reproductive: Prostate is unremarkable. Other: Rectum unremarkable Musculoskeletal: Dorsal column stimulator battery pack noted within the left gluteal subcutaneous soft tissues. Degenerative changes are seen  within the lumbar spine. No acute bone abnormality. IMPRESSION: Choledocholithiasis with a 16 mm x 13 mm calculus identified within the distal common duct. Marked extrahepatic biliary ductal dilation. Periductal inflammatory changes are also identified suggesting superimposed cholangitis. Aortic Atherosclerosis (ICD10-I70.0). Electronically Signed: By: Fidela Salisbury MD On: 07/23/2020 20:40   US Abdomen Limited RUQ (LIVER/GB)  Result Date: 07/23/2020 CLINICAL DATA:  Right upper quadrant abdominal pain EXAM: ULTRASOUND ABDOMEN LIMITED RIGHT UPPER QUADRANT COMPARISON:  07/19/2020, 12/20/2006 FINDINGS: Gallbladder: Absent Common bile duct: Diameter: Dilated measuring 12-13 mm in mid to distal diameter Liver: The liver parenchyma is diffusely heterogeneously increased in echogenicity and the echotexture is mildly coarsened in keeping with changes of probable moderate hepatic steatosis, stable since prior examination. There is no intrahepatic biliary ductal dilation identified. No intrahepatic mass lesion seen. Portal vein is patent on color Doppler imaging with normal direction of blood flow towards the liver. Other: No ascites is present. IMPRESSION: Marked dilation of the extrahepatic bile duct. This appears stable since immediate prior examination, though progressed since remote prior examination. While this may simply represent post cholecystectomy change, correlation with liver enzymes would be helpful to exclude an obstructive process. If abnormal, ERCP or MRCP examination would be more helpful to assess the distal duct. Moderate hepatic steatosis Status post cholecystectomy Electronically Signed   By: Fidela Salisbury MD   On: 07/23/2020 19:25    Scheduled Meds: . metoprolol succinate  50 mg Oral Daily  . mometasone-formoterol  2 puff Inhalation BID   Continuous Infusions: . sodium chloride 75 mL/hr (07/24/20 1109)  . piperacillin-tazobactam (ZOSYN)  IV Stopped (07/24/20 0938)     LOS: 1 day    Time spent: 40 minutes  Lorella Nimrod, MD [facility]  If 7PM-7AM, please contact night-coverage [email or web address]  07/24/2020, 1:04 PM   This record has been created using Systems analyst. Errors have been sought and corrected,but may not always be located. Such creation errors do not reflect on the standard of care.

## 2020-07-24 NOTE — ED Notes (Signed)
Pt ambulated to/from restroom without assistance. Tolerated well.

## 2020-07-25 ENCOUNTER — Inpatient Hospital Stay (HOSPITAL_COMMUNITY): Payer: Medicare Other | Admitting: Certified Registered Nurse Anesthetist

## 2020-07-25 ENCOUNTER — Inpatient Hospital Stay (HOSPITAL_COMMUNITY): Payer: Medicare Other

## 2020-07-25 ENCOUNTER — Encounter (HOSPITAL_COMMUNITY): Payer: Self-pay | Admitting: Internal Medicine

## 2020-07-25 ENCOUNTER — Encounter (HOSPITAL_COMMUNITY)
Admission: EM | Disposition: A | Discharge status: Home / Self Care | Payer: Self-pay | Source: Home / Self Care | Attending: Internal Medicine

## 2020-07-25 DIAGNOSIS — R011 Cardiac murmur, unspecified: Secondary | ICD-10-CM

## 2020-07-25 HISTORY — PX: BILIARY DILATION: SHX6850

## 2020-07-25 HISTORY — PX: SPHINCTEROTOMY: SHX5279

## 2020-07-25 HISTORY — PX: ERCP: SHX5425

## 2020-07-25 HISTORY — PX: REMOVAL OF STONES: SHX5545

## 2020-07-25 LAB — RETICULOCYTES
Immature Retic Fract: 23.6 % — ABNORMAL HIGH (ref 2.3–15.9)
RBC.: 3.15 MIL/uL — ABNORMAL LOW (ref 4.22–5.81)
Retic Count, Absolute: 83.8 10*3/uL (ref 19.0–186.0)
Retic Ct Pct: 2.7 % (ref 0.4–3.1)

## 2020-07-25 LAB — CBC WITH DIFFERENTIAL/PLATELET
Abs Immature Granulocytes: 0.05 10*3/uL (ref 0.00–0.07)
Basophils Absolute: 0 10*3/uL (ref 0.0–0.1)
Basophils Relative: 0 %
Eosinophils Absolute: 0.1 10*3/uL (ref 0.0–0.5)
Eosinophils Relative: 2 %
HCT: 32.4 % — ABNORMAL LOW (ref 39.0–52.0)
Hemoglobin: 10.7 g/dL — ABNORMAL LOW (ref 13.0–17.0)
Immature Granulocytes: 1 %
Lymphocytes Relative: 11 %
Lymphs Abs: 0.9 10*3/uL (ref 0.7–4.0)
MCH: 34.2 pg — ABNORMAL HIGH (ref 26.0–34.0)
MCHC: 33 g/dL (ref 30.0–36.0)
MCV: 103.5 fL — ABNORMAL HIGH (ref 80.0–100.0)
Monocytes Absolute: 0.6 10*3/uL (ref 0.1–1.0)
Monocytes Relative: 7 %
Neutro Abs: 6.3 10*3/uL (ref 1.7–7.7)
Neutrophils Relative %: 79 %
Platelets: 241 10*3/uL (ref 150–400)
RBC: 3.13 MIL/uL — ABNORMAL LOW (ref 4.22–5.81)
RDW: 15.7 % — ABNORMAL HIGH (ref 11.5–15.5)
WBC: 7.9 10*3/uL (ref 4.0–10.5)
nRBC: 0 % (ref 0.0–0.2)

## 2020-07-25 LAB — VITAMIN B12: Vitamin B-12: >7500 pg/mL — ABNORMAL HIGH (ref 180–914)

## 2020-07-25 LAB — COMPREHENSIVE METABOLIC PANEL
ALT: 193 U/L — ABNORMAL HIGH (ref 0–44)
AST: 155 U/L — ABNORMAL HIGH (ref 15–41)
Albumin: 2.5 g/dL — ABNORMAL LOW (ref 3.5–5.0)
Alkaline Phosphatase: 606 U/L — ABNORMAL HIGH (ref 38–126)
Anion gap: 11 (ref 5–15)
BUN: 34 mg/dL — ABNORMAL HIGH (ref 8–23)
CO2: 22 mmol/L (ref 22–32)
Calcium: 7.5 mg/dL — ABNORMAL LOW (ref 8.9–10.3)
Chloride: 100 mmol/L (ref 98–111)
Creatinine, Ser: 1.63 mg/dL — ABNORMAL HIGH (ref 0.61–1.24)
GFR, Estimated: 46 mL/min — ABNORMAL LOW (ref >60)
Glucose, Bld: 108 mg/dL — ABNORMAL HIGH (ref 70–99)
Potassium: 3.4 mmol/L — ABNORMAL LOW (ref 3.5–5.1)
Sodium: 133 mmol/L — ABNORMAL LOW (ref 135–145)
Total Bilirubin: 5.7 mg/dL — ABNORMAL HIGH (ref 0.3–1.2)
Total Protein: 6 g/dL — ABNORMAL LOW (ref 6.5–8.1)

## 2020-07-25 LAB — ECHOCARDIOGRAM COMPLETE
Area-P 1/2: 3.69 cm2
Calc EF: 57.4 %
Height: 71 in
S' Lateral: 3.6 cm
Single Plane A2C EF: 42 %
Single Plane A4C EF: 65.3 %
Weight: 3184 oz

## 2020-07-25 LAB — FERRITIN: Ferritin: 936 ng/mL — ABNORMAL HIGH (ref 24–336)

## 2020-07-25 LAB — IRON AND TIBC
Iron: 137 ug/dL (ref 45–182)
Saturation Ratios: 59 % — ABNORMAL HIGH (ref 17.9–39.5)
TIBC: 234 ug/dL — ABNORMAL LOW (ref 250–450)
UIBC: 97 ug/dL

## 2020-07-25 LAB — FOLATE: Folate: 7.7 ng/mL (ref >5.9)

## 2020-07-25 LAB — MAGNESIUM: Magnesium: 1.7 mg/dL (ref 1.7–2.4)

## 2020-07-25 SURGERY — ERCP, WITH INTERVENTION IF INDICATED
Anesthesia: General

## 2020-07-25 MED ORDER — POTASSIUM CHLORIDE CRYS ER 20 MEQ PO TBCR
40.0000 meq | EXTENDED_RELEASE_TABLET | Freq: Once | ORAL | Status: AC
  Administered 2020-07-25: 09:00:00 40 meq via ORAL
  Filled 2020-07-25: qty 2

## 2020-07-25 MED ORDER — METOPROLOL SUCCINATE ER 25 MG PO TB24
75.0000 mg | ORAL_TABLET | Freq: Every day | ORAL | Status: DC
  Administered 2020-07-26: 10:00:00 75 mg via ORAL
  Filled 2020-07-25: qty 3

## 2020-07-25 MED ORDER — PROPOFOL 500 MG/50ML IV EMUL
INTRAVENOUS | Status: AC
  Filled 2020-07-25: qty 50

## 2020-07-25 MED ORDER — PROPOFOL 10 MG/ML IV BOLUS
INTRAVENOUS | Status: DC | PRN
  Administered 2020-07-25: 150 mg via INTRAVENOUS

## 2020-07-25 MED ORDER — INDOMETHACIN 50 MG RE SUPP
RECTAL | Status: AC
  Filled 2020-07-25: qty 2

## 2020-07-25 MED ORDER — GLUCAGON HCL RDNA (DIAGNOSTIC) 1 MG IJ SOLR
INTRAMUSCULAR | Status: DC | PRN
  Administered 2020-07-25: .5 mg via INTRAVENOUS

## 2020-07-25 MED ORDER — PHENYLEPHRINE 40 MCG/ML (10ML) SYRINGE FOR IV PUSH (FOR BLOOD PRESSURE SUPPORT)
PREFILLED_SYRINGE | INTRAVENOUS | Status: DC | PRN
  Administered 2020-07-25 (×4): 80 ug via INTRAVENOUS
  Administered 2020-07-25: 120 ug via INTRAVENOUS
  Administered 2020-07-25: 80 ug via INTRAVENOUS

## 2020-07-25 MED ORDER — MIDAZOLAM HCL 5 MG/5ML IJ SOLN
INTRAMUSCULAR | Status: DC | PRN
  Administered 2020-07-25: 2 mg via INTRAVENOUS

## 2020-07-25 MED ORDER — LIDOCAINE 2% (20 MG/ML) 5 ML SYRINGE
INTRAMUSCULAR | Status: DC | PRN
  Administered 2020-07-25: 80 mg via INTRAVENOUS

## 2020-07-25 MED ORDER — METOPROLOL SUCCINATE ER 25 MG PO TB24
100.0000 mg | ORAL_TABLET | Freq: Every day | ORAL | Status: DC
  Administered 2020-07-25: 09:00:00 100 mg via ORAL
  Filled 2020-07-25: qty 4

## 2020-07-25 MED ORDER — FENTANYL CITRATE (PF) 100 MCG/2ML IJ SOLN
INTRAMUSCULAR | Status: AC
  Filled 2020-07-25: qty 2

## 2020-07-25 MED ORDER — DILTIAZEM HCL-DEXTROSE 125-5 MG/125ML-% IV SOLN (PREMIX)
5.0000 mg/h | INTRAVENOUS | Status: DC
  Administered 2020-07-25: 16:00:00 5 mg/h via INTRAVENOUS
  Administered 2020-07-26: 02:00:00 12.5 mg/h via INTRAVENOUS
  Filled 2020-07-25 (×2): qty 125

## 2020-07-25 MED ORDER — GLUCAGON HCL RDNA (DIAGNOSTIC) 1 MG IJ SOLR
INTRAMUSCULAR | Status: AC
  Filled 2020-07-25: qty 1

## 2020-07-25 MED ORDER — ROCURONIUM BROMIDE 10 MG/ML (PF) SYRINGE
PREFILLED_SYRINGE | INTRAVENOUS | Status: DC | PRN
  Administered 2020-07-25: 60 mg via INTRAVENOUS

## 2020-07-25 MED ORDER — MIDAZOLAM HCL 2 MG/2ML IJ SOLN
INTRAMUSCULAR | Status: AC
  Filled 2020-07-25: qty 2

## 2020-07-25 MED ORDER — DEXAMETHASONE SODIUM PHOSPHATE 10 MG/ML IJ SOLN
INTRAMUSCULAR | Status: DC | PRN
  Administered 2020-07-25: 10 mg via INTRAVENOUS

## 2020-07-25 MED ORDER — SODIUM CHLORIDE 0.9 % IV SOLN: INTRAVENOUS | Status: DC

## 2020-07-25 MED ORDER — PROPOFOL 1000 MG/100ML IV EMUL
INTRAVENOUS | Status: AC
  Filled 2020-07-25: qty 200

## 2020-07-25 MED ORDER — SUGAMMADEX SODIUM 200 MG/2ML IV SOLN
INTRAVENOUS | Status: DC | PRN
  Administered 2020-07-25: 200 mg via INTRAVENOUS

## 2020-07-25 MED ORDER — LACTATED RINGERS IV SOLN: INTRAVENOUS | Status: DC

## 2020-07-25 MED ORDER — ONDANSETRON HCL 4 MG/2ML IJ SOLN
INTRAMUSCULAR | Status: DC | PRN
  Administered 2020-07-25: 4 mg via INTRAVENOUS

## 2020-07-25 MED ORDER — FENTANYL CITRATE (PF) 100 MCG/2ML IJ SOLN
INTRAMUSCULAR | Status: DC | PRN
  Administered 2020-07-25: 100 ug via INTRAVENOUS

## 2020-07-25 MED ORDER — SODIUM CHLORIDE 0.9 % IV SOLN
INTRAVENOUS | Status: DC | PRN
  Administered 2020-07-25: 13:00:00 120 mL

## 2020-07-25 NOTE — Anesthesia Postprocedure Evaluation (Signed)
Anesthesia Post Note  Patient: Justin [name]  Procedure(s) Performed: ENDOSCOPIC RETROGRADE CHOLANGIOPANCREATOGRAPHY (ERCP) (N/A ) SPHINCTEROTOMY BILIARY DILATION REMOVAL OF STONES     Patient location during evaluation: PACU Anesthesia Type: General Level of consciousness: awake and alert Pain management: pain level controlled Vital Signs Assessment: post-procedure vital signs reviewed and stable Respiratory status: spontaneous breathing, nonlabored ventilation, respiratory function stable and patient connected to nasal cannula oxygen Cardiovascular status: blood pressure returned to baseline and stable Postop Assessment: no apparent nausea or vomiting Anesthetic complications: no   No complications documented.  Last Vitals:  Vitals:   07/25/20 1340 07/25/20 1500  BP: (!) 161/82 (!) 142/84  Pulse: (!) 53 97  Resp: 18 17  Temp:    SpO2: 93% 93%    Last Pain:  Vitals:   07/25/20 1439  TempSrc:   PainSc: 6    Pain Goal:                   Nelle Don Bethanny Toelle

## 2020-07-25 NOTE — Op Note (Signed)
[facility] Patient Name: Justin [name] Procedure Date: 07/25/2020 MRN: [id number] Attending MD: Vida Rigger , MD Date of Birth: Mar 28, 1951 CSN: [id number] Age: 69 Admit Type: Inpatient Procedure:                ERCP Indications:              For therapy of bile duct stone(s) seen on CT scan                            with significant elevated liver test Providers:                Vida Rigger, MD, Dwain Sarna, RN, Beryle Beams,                            Technician, Sherron Flemings CRNA Referring MD:              Medicines:                General Anesthesia Complications:            No immediate complications. Estimated Blood Loss:     Estimated blood loss was minimal. Procedure:                Pre-Anesthesia Assessment:                           - Prior to the procedure, a History and Physical                            was performed, and patient medications and                            allergies were reviewed. The patient's tolerance of                            previous anesthesia was also reviewed. The risks                            and benefits of the procedure and the sedation                            options and risks were discussed with the patient.                            All questions were answered, and informed consent                            was obtained. Prior Anticoagulants: The patient has                            taken Xarelto (rivaroxaban), last dose was 3 days                            prior to procedure. ASA Grade Assessment: II - A  patient with mild systemic disease. After reviewing                            the risks and benefits, the patient was deemed in                            satisfactory condition to undergo the procedure.                           After obtaining informed consent, the scope was                            passed under direct vision. Throughout the                             procedure, the patient's blood pressure, pulse, and                            oxygen saturations were monitored continuously. The                            Olympus [id number]-[id number]) was introduced through                            the mouth, and used to inject contrast into and                            used to cannulate the bile duct. The ERCP was                            technically difficult and complex due to abnormal                            anatomy. Successful completion of the procedure was                            aided by performing the maneuvers documented                            (below) in this report. The patient tolerated the                            procedure well. Scope In: Scope Out: Findings:      The major papilla was located entirely within a diverticulum. The major       papilla was congested. Deep selective CBD was readily obtained on the       first attempt and the wire was advanced into the intrahepatics and we       proceeded with a biliary sphincterotomy was made with a Hydratome       sphincterotome using ERBE electrocautery. There was self limited oozing       from the sphincterotomy which did not require treatment. It is       periodically with pulling the balloon through as below but was not  bleeding at the end of the procedure and we were only able to initially       proceed with a partial sphincterotomy due to scope and ampullary       position and the common bile duct contained stone(s), the largest of       which was large in diameter. To discover objects, the biliary tree was       swept with an adjustable 12- 15 mm balloon starting at the bifurcation.       Sludge was swept from the duct. Many stones were removed. One stone       remained. We then increased the sphincterotomy in the customary fashion       and also proceeded with dilation of the common bile duct with a 10 mm       balloon dilator which was successful. Prior to  dilation repeat sweeping       was not successful in removing the residual large stone and we proceeded       with cannot lithotripsy with the stone retrieval basket 2.5 cm x 2 and       the [place] stone pressure which was successful. The biliary tree was       swept with the adjustable balloon starting at the bifurcation multiple       more times. Sludge was swept from the duct. All stones were removed at       the end of the procedure and both sides balloons passed through the       patent sphincterotomy site usually with mild resistance although       occasionally the 12 passed readily. There was no pancreatic duct       injection or wire advancement throughout the procedure and there was       adequate biliary drainage at the end of the procedure and no obvious       residual significant stone on occlusion cholangiogram at the end of the       procedure Impression:               - The major papilla was located entirely within a                            diverticulum.                           - The major papilla appeared congested.                           - Choledocholithiasis was found. Partial removal                            was accomplished by biliary sphincterotomy; no                            stent was inserted.                           - A biliary sphincterotomy x2 was performed.                           - The biliary tree was swept multiple times.                           -  Common bile duct was successfully dilated.                           - Lithotripsy was successful.                           - The biliary tree was swept at the end of the                            procedure and no significant stones were seen. Moderate Sedation:      Not Applicable - Patient had care per Anesthesia. Recommendation:           - Clear liquid diet for 6 hours. If doing great at                            6:30 PM may have soft solids                           - Check liver  enzymes (AST, ALT, alkaline                            phosphatase, bilirubin) and hemogram with white                            blood cell count and platelets tomorrow.                           - Return to GI clinic PRN.                           - Telephone GI clinic if symptomatic PRN. Procedure Code(s):        --- Professional ---                           [phone], Endoscopic retrograde                            cholangiopancreatography (ERCP); with destruction                            of calculi, any method (eg, mechanical,                            electrohydraulic, lithotripsy)                           43277, 59, Endoscopic retrograde                            cholangiopancreatography (ERCP); with                            trans-endoscopic balloon dilation of                            biliary/pancreatic duct(s) or of ampulla                            (  sphincteroplasty), including sphincterotomy, when                            performed, each duct Diagnosis Code(s):        --- Professional ---                           K80.50, Calculus of bile duct without cholangitis                            or cholecystitis without obstruction                           K83.8, Other specified diseases of biliary tract CPT copyright 2019 American [employer]. All rights reserved. The codes documented in this report are preliminary and upon coder review may  be revised to meet current compliance requirements. Vida RiggerMarc Audria Takeshita, MD 07/25/2020 1:02:25 PM This report has been signed electronically. Number of Addenda: 0

## 2020-07-25 NOTE — Progress Notes (Signed)
PROGRESS NOTE    Justin [name]  JIR:[id number] DOB: Dec 31, 1950 DOA: 07/23/2020 PCP: Sueanne Margarita, DO   Brief Narrative: Taken from H&P Justin [name] is a 69 y.o. male with medical history significant of A. fib on Eliquis, COPD on Symbicort and albuterol as needed, chronic back pain and ankylosing spondylitis on opioids who has a history of elevated LFTs and a history of hepatitis C exposure although RNA is negative , sp chocystectomy, Presented with 1 week history of right upper quadrant pain, found to have worsening transaminitis, and T bili. Patient also recently started some Chinese supplement for his breathing issues. Also having some nausea and vomiting.  CT dominant with concern of CBD extrahepatic bile duct.  Concern of cholangitis.  He was started on Zosyn and GI was consulted, patient had his MRCP earlier today, successful sphincterotomy with removal of multiple stones.  Subjective: Patient was feeling better when seen after the procedure today.  He wants to go home.  Heart rate still in 130s  Assessment & Plan:   Active Problems:   Chronic pain   Cholangitis   AKI (acute kidney injury) ([facility])   History of hepatitis C   Atrial fibrillation with RVR (HCC)  Sepsis secondary to cholangitis.  Initially met sepsis criteria, most likely secondary to cholangitis.  Procalcitonin elevated at 6.81.  T bili at 14.7 with alkaline phosphatase of 712, AST 405 and ALT 281.  Significant CBD dilatation on CT abdomen and RUQ ultrasound.  Blood cultures negative. Liver enzymes started improving. Underwent successful MRCP with sphincterotomy and removal of multiple stones. -Per GI-start him on clear liquid and slowly advance if tolerated. -Continue with Zosyn. -Supportive care with pain and nausea management.  AKI.  Another increase in his creatinine to 1.63 today. -Give him some gentle IV fluid. -Continue to monitor  Hypokalemia.  Potassium of 3.4 with magnesium of 1.7  today. -Replete potassium and magnesium and monitor.  Paroxysmal atrial fibrillation with RVR.  Heart rate remained in 130s most of the time. -Start him on Cardizem infusion. -Increase the dose of metoprolol to 75 mg daily. -Home dose of Xarelto is being held for possible procedure, we will restart from tomorrow. -Consult cardiology.  History of hep C.  Hep C RNA quantitative studies are pending. -We will need outpatient follow-up.  Chronic pain.  Home meds will be resumed after the procedure.  Objective: Vitals:   07/25/20 0000 07/25/20 0100 07/25/20 0300 07/25/20 0400  BP: 122/89 123/78 127/69 137/75  Pulse: (!) 114 100 (!) 119 (!) 123  Resp: 16 16 13 15   Temp: 98.1 F (36.7 C)   98.2 F (36.8 C)  TempSrc: Oral   Oral  SpO2: 92% 95% (!) 87% 95%  Weight:      Height:        Intake/Output Summary (Last 24 hours) at 07/25/2020 0802 Last data filed at 07/25/2020 0400 Gross per 24 hour  Intake 707.35 ml  Output 250 ml  Net 457.35 ml   Filed Weights   07/23/20 2056  Weight: 90.3 kg    Examination:  General.  Well-developed gentleman, in no acute distress. Pulmonary.  Lungs clear bilaterally, normal respiratory effort. CV.  Irregularly irregular with tachycardia. Abdomen.  Soft, nontender, nondistended, BS positive. CNS.  Alert and oriented x3.  No focal neurologic deficit. Extremities.  No edema, no cyanosis, pulses intact and symmetrical. Psychiatry.  Judgment and insight appears normal.   DVT prophylaxis: SCDs, will resume Xarelto once cleared from GI after MRCP.  Code Status: Full Family Communication: Discussed with patient Disposition Plan:  Status is: Inpatient  Remains inpatient appropriate because:Inpatient level of care appropriate due to severity of illness   Dispo: The patient is from: Home              Anticipated d/c is to: Home              Anticipated d/c date is: 2 days              Patient currently is not medically stable to  d/c.   Consultants:   GI  Cardiology  Procedures:  ERCP  Antimicrobials:  Zosyn  Data Reviewed: I have personally reviewed following labs and imaging studies  CBC: Recent Labs  Lab 07/23/20 1537 07/24/20 0038 07/25/20 0256  WBC 23.1* 15.0* 7.9  NEUTROABS  --  13.3* 6.3  HGB 14.1 11.9* 10.7*  HCT 41.0 35.1* 32.4*  MCV 101.2* 102.0* 103.5*  PLT 302 281 846   Basic Metabolic Panel: Recent Labs  Lab 07/23/20 1537 07/23/20 2230 07/24/20 0038 07/25/20 0256  NA 137  --  137 133*  K 3.9  --  4.4 3.4*  CL 93*  --  98 100  CO2 30  --  25 22  GLUCOSE 141*  --  101* 108*  BUN 27*  --  31* 34*  CREATININE 1.45*  --  0.82 1.63*  CALCIUM 9.3  --  8.2* 7.5*  MG  --  1.7 1.7  --   PHOS  --   --  3.9  --    GFR: Estimated Creatinine Clearance: 46.8 mL/min (A) (by C-G formula based on SCr of 1.63 mg/dL (H)). Liver Function Tests: Recent Labs  Lab 07/23/20 1537 07/24/20 0038 07/25/20 0256  AST 280* 405* 155*  ALT 235* 261* 193*  ALKPHOS 818* 712* 606*  BILITOT 17.9* 14.7* 5.7*  PROT 7.8 6.1* 6.0*  ALBUMIN 3.3* 2.7* 2.5*   Recent Labs  Lab 07/23/20 1537  LIPASE 21   No results for input(s): AMMONIA in the last 168 hours. Coagulation Profile: Recent Labs  Lab 07/23/20 2317  INR 1.1   Cardiac Enzymes: Recent Labs  Lab 07/23/20 2230  CKTOTAL 84   BNP (last 3 results) No results for input(s): PROBNP in the last 8760 hours. HbA1C: No results for input(s): HGBA1C in the last 72 hours. CBG: No results for input(s): GLUCAP in the last 168 hours. Lipid Profile: No results for input(s): CHOL, HDL, LDLCALC, TRIG, CHOLHDL, LDLDIRECT in the last 72 hours. Thyroid Function Tests: Recent Labs    07/24/20 0416  TSH 0.745   Anemia Panel: No results for input(s): VITAMINB12, FOLATE, FERRITIN, TIBC, IRON, RETICCTPCT in the last 72 hours. Sepsis Labs: Recent Labs  Lab 07/23/20 2317 07/24/20 0038  PROCALCITON  --  6.81  LATICACIDVEN 1.5 1.0    Recent  Results (from the past 240 hour(s))  Blood culture (routine x 2)     Status: None (Preliminary result)   Collection Time: 07/23/20  9:27 PM   Specimen: BLOOD RIGHT FOREARM  Result Value Ref Range Status   Specimen Description   Final    BLOOD RIGHT FOREARM Performed at [facility] [address] [place], [place] [address]    Special Requests   Final    BOTTLES DRAWN AEROBIC AND ANAEROBIC Blood Culture adequate volume Performed at [facility] [address] [place], [place] [address]    Culture   Final  NO GROWTH < 12 HOURS Performed at [facility] [address] [place], [place] [address]    Report Status PENDING  Incomplete  Blood culture (routine x 2)     Status: None (Preliminary result)   Collection Time: 07/23/20  9:27 PM   Specimen: BLOOD LEFT FOREARM  Result Value Ref Range Status   Specimen Description   Final    BLOOD LEFT FOREARM Performed at [facility] [address] [place], [place] [address]    Special Requests   Final    BOTTLES DRAWN AEROBIC AND ANAEROBIC Blood Culture adequate volume Performed at [facility] [address], [place], [place] [address]    Culture   Final    NO GROWTH < 12 HOURS Performed at [facility] [address], [place], [place] [address]    Report Status PENDING  Incomplete  Resp Panel by RT-PCR (Flu A&B, Covid) Nasopharyngeal Swab     Status: None   Collection Time: 07/23/20  9:27 PM   Specimen: Nasopharyngeal Swab; Nasopharyngeal(NP) swabs in vial transport medium  Result Value Ref Range Status   SARS Coronavirus 2 by RT PCR NEGATIVE NEGATIVE Final    Comment: (NOTE) SARS-CoV-2 target nucleic acids are NOT DETECTED.  The SARS-CoV-2 RNA is generally detectable in upper respiratory specimens during the acute phase of infection. The lowest concentration of SARS-CoV-2 viral copies this assay can detect is 138 copies/mL. A negative  result does not preclude SARS-Cov-2 infection and should not be used as the sole basis for treatment or other patient management decisions. A negative result may occur with  improper specimen collection/handling, submission of specimen other than nasopharyngeal swab, presence of viral mutation(s) within the areas targeted by this assay, and inadequate number of viral copies(<138 copies/mL). A negative result must be combined with clinical observations, patient history, and epidemiological information. The expected result is Negative.  Fact Sheet for Patients:  EntrepreneurPulse.com.au  Fact Sheet for Healthcare Providers:  IncredibleEmployment.be  This test is no t yet approved or cleared by the [place] FDA and  has been authorized for detection and/or diagnosis of SARS-CoV-2 by FDA under an Emergency Use Authorization (EUA). This EUA will remain  in effect (meaning this test can be used) for the duration of the COVID-19 declaration under Section 564(b)(1) of the Act, 21 U.S.C.section 360bbb-3(b)(1), unless the authorization is terminated  or revoked sooner.       Influenza A by PCR NEGATIVE NEGATIVE Final   Influenza B by PCR NEGATIVE NEGATIVE Final    Comment: (NOTE) The Xpert Xpress SARS-CoV-2/FLU/RSV plus assay is intended as an aid in the diagnosis of influenza from Nasopharyngeal swab specimens and should not be used as a sole basis for treatment. Nasal washings and aspirates are unacceptable for Xpert Xpress SARS-CoV-2/FLU/RSV testing.  Fact Sheet for Patients: EntrepreneurPulse.com.au  Fact Sheet for Healthcare Providers: IncredibleEmployment.be  This test is not yet approved or cleared by the [place] FDA and has been authorized for detection and/or diagnosis of SARS-CoV-2 by FDA under an Emergency Use Authorization (EUA). This EUA will remain in effect (meaning this test can be used)  for the duration of the COVID-19 declaration under Section 564(b)(1) of the Act, 21 U.S.C. section 360bbb-3(b)(1), unless the authorization is terminated or revoked.  Performed at [facility], [place] [address], [place], [place] [address]   MRSA PCR Screening     Status: None   Collection Time: 07/24/20  4:55 PM  Specimen: Nasal Mucosa; Nasopharyngeal  Result Value Ref Range Status   MRSA by PCR NEGATIVE NEGATIVE Final    Comment:        The GeneXpert MRSA Assay (FDA approved for NASAL specimens only), is one component of a comprehensive MRSA colonization surveillance program. It is not intended to diagnose MRSA infection nor to guide or monitor treatment for MRSA infections. Performed at [facility], [place] [address] [place], [place] [address]      Radiology Studies: CT ABDOMEN PELVIS W CONTRAST  Addendum Date: 07/23/2020   ADDENDUM REPORT: 07/23/2020 20:46 ADDENDUM: These results were called by telephone at the time of interpretation on 07/23/2020 at 8:43 pm to provider [facility] , who verbally acknowledged these results. Electronically Signed   By: Fidela Salisbury MD   On: 07/23/2020 20:46   Result Date: 07/23/2020 CLINICAL DATA:  Right lower quadrant abdominal pain EXAM: CT ABDOMEN AND PELVIS WITH CONTRAST TECHNIQUE: Multidetector CT imaging of the abdomen and pelvis was performed using the standard protocol following bolus administration of intravenous contrast. CONTRAST:  155m OMNIPAQUE IOHEXOL 300 MG/ML  SOLN COMPARISON:  12/02/2006 FINDINGS: Lower chest: The visualized lung bases are clear bilaterally. Mild coronary artery calcification. Cardiac size is at the upper limits of normal. No pericardial effusion. Dorsal column stimulator leads extend superior to the upper margin of the examination. Hepatobiliary: Cholecystectomy has been performed. There is marked extrahepatic biliary ductal dilation with a 16 mm x 13 mm stone identified  within the a distal duct in keeping with choledocholithiasis. Moderate periductal inflammatory stranding is present and there is enhancement of the biliary wall suggesting changes of superimposed cholangitis. No intrahepatic biliary ductal dilation. Mild to moderate hepatic steatosis. Portal vein is patent. Pancreas: Unremarkable Spleen: Unremarkable Adrenals/Urinary Tract: Adrenal glands are unremarkable. Kidneys are normal, without renal calculi, focal lesion, or hydronephrosis. Bladder is unremarkable. Stomach/Bowel: Severe sigmoid diverticulosis. Diverticulum noted arising from the gastric fundus. The stomach, small bowel, and large bowel are otherwise unremarkable. No evidence of obstruction or focal inflammation. Appendectomy has been performed. Small fat containing umbilical hernia. No free intraperitoneal gas or fluid. Vascular/Lymphatic: Moderate aortoiliac atherosclerotic calcification. No aortic aneurysm. No pathologic adenopathy within the abdomen and pelvis. Reproductive: Prostate is unremarkable. Other: Rectum unremarkable Musculoskeletal: Dorsal column stimulator battery pack noted within the left gluteal subcutaneous soft tissues. Degenerative changes are seen within the lumbar spine. No acute bone abnormality. IMPRESSION: Choledocholithiasis with a 16 mm x 13 mm calculus identified within the distal common duct. Marked extrahepatic biliary ductal dilation. Periductal inflammatory changes are also identified suggesting superimposed cholangitis. Aortic Atherosclerosis (ICD10-I70.0). Electronically Signed: By: AFidela SalisburyMD On: 07/23/2020 20:40   [place]Abdomen Limited RUQ (LIVER/GB)  Result Date: 07/23/2020 CLINICAL DATA:  Right upper quadrant abdominal pain EXAM: ULTRASOUND ABDOMEN LIMITED RIGHT UPPER QUADRANT COMPARISON:  07/19/2020, 12/20/2006 FINDINGS: Gallbladder: Absent Common bile duct: Diameter: Dilated measuring 12-13 mm in mid to distal diameter Liver: The liver parenchyma is diffusely  heterogeneously increased in echogenicity and the echotexture is mildly coarsened in keeping with changes of probable moderate hepatic steatosis, stable since prior examination. There is no intrahepatic biliary ductal dilation identified. No intrahepatic mass lesion seen. Portal vein is patent on color Doppler imaging with normal direction of blood flow towards the liver. Other: No ascites is present. IMPRESSION: Marked dilation of the extrahepatic bile duct. This appears stable since immediate prior examination, though progressed since remote prior examination. While this may simply represent post cholecystectomy change, correlation with liver enzymes  would be helpful to exclude an obstructive process. If abnormal, ERCP or MRCP examination would be more helpful to assess the distal duct. Moderate hepatic steatosis Status post cholecystectomy Electronically Signed   By: Fidela Salisbury MD   On: 07/23/2020 19:25    Scheduled Meds: . Chlorhexidine Gluconate Cloth  6 each Topical Daily  . mouth rinse  15 mL Mouth Rinse BID  . metoprolol succinate  100 mg Oral Daily  . mometasone-formoterol  2 puff Inhalation BID   Continuous Infusions: . piperacillin-tazobactam (ZOSYN)  IV 3.375 g (07/25/20 0551)     LOS: 2 days   Time spent: 35 minutes  Lorella Nimrod, MD [facility]  If 7PM-7AM, please contact night-coverage [email or web address]  07/25/2020, 8:02 AM   This record has been created using Systems analyst. Errors have been sought and corrected,but may not always be located. Such creation errors do not reflect on the standard of care.

## 2020-07-25 NOTE — Progress Notes (Signed)
Pt transported off the unit for ERCP. Monitor on standby

## 2020-07-25 NOTE — Progress Notes (Signed)
Justin [name] 10:58 AM  Subjective: Patient seen and examined in hospital computer reviewed case discussed with our PA my partner Dr. Bosie Clos and he is a little better than when he came to the hospital we had a long talk about the success rate difficulty and risks of his procedure and we answered all of his questions  Objective: Vital signs stable afebrile no acute distress exam please see preassessment evaluation liver tests downward trend white count better CT reviewed ultrasound reviewed  Assessment: Probable CBD stone  Plan: We rediscussed ERCP risks benefits methods and success rate and discussed his previous one as well and okay to proceed today with anesthesia assistance  [facility]  office [phone] After 5PM or if no answer call [phone]

## 2020-07-25 NOTE — Plan of Care (Signed)
  Problem: Education: Goal: Knowledge of General Education information will improve Description: Including pain rating scale, medication(s)/side effects and non-pharmacologic comfort measures Outcome: Progressing   Problem: Health Behavior/Discharge Planning: Goal: Ability to manage health-related needs will improve Outcome: Progressing   Problem: Clinical Measurements: Goal: Ability to maintain clinical measurements within normal limits will improve Outcome: Progressing Goal: Will remain free from infection Outcome: Progressing   Problem: Activity: Goal: Risk for activity intolerance will decrease Outcome: Progressing   Problem: Nutrition: Goal: Adequate nutrition will be maintained Outcome: Progressing   Problem: Elimination: Goal: Will not experience complications related to bowel motility Outcome: Progressing   Problem: Safety: Goal: Ability to remain free from injury will improve Outcome: Progressing   Problem: Education: Goal: Knowledge of disease or condition will improve Outcome: Progressing Goal: Understanding of medication regimen will improve Outcome: Progressing   Problem: Cardiac: Goal: Ability to achieve and maintain adequate cardiopulmonary perfusion will improve Outcome: Progressing

## 2020-07-25 NOTE — Anesthesia Procedure Notes (Signed)
Procedure Name: Intubation Date/Time: 07/25/2020 11:12 AM Performed by: Gerald Leitz, CRNA Pre-anesthesia Checklist: Patient identified, Patient being monitored, Timeout performed, Emergency Drugs available and Suction available Patient Re-evaluated:Patient Re-evaluated prior to induction Oxygen Delivery Method: Circle system utilized Preoxygenation: Pre-oxygenation with 100% oxygen Induction Type: IV induction Ventilation: Mask ventilation without difficulty Laryngoscope Size: Mac and 3 Grade View: Grade I Tube type: Oral Tube size: 7.5 mm Number of attempts: 1 Placement Confirmation: ETT inserted through vocal cords under direct vision,  positive ETCO2 and breath sounds checked- equal and bilateral Secured at: 23 cm Tube secured with: Tape Dental Injury: Teeth and Oropharynx as per pre-operative assessment

## 2020-07-25 NOTE — Transfer of Care (Signed)
Immediate Anesthesia Transfer of Care Note  Patient: Justin [name]  Procedure(s) Performed: Procedure(s): ENDOSCOPIC RETROGRADE CHOLANGIOPANCREATOGRAPHY (ERCP) (N/A)  Patient Location: PACU  Anesthesia Type:General  Level of Consciousness: Alert, Awake, Oriented  Airway & Oxygen Therapy: Patient Spontanous Breathing  Post-op Assessment: Report given to RN  Post vital signs: Reviewed and stable  Last Vitals:  Vitals:   07/25/20 0849 07/25/20 0946  BP: (!) 159/112 (!) 154/103  Pulse: 100 (!) 101  Resp:  17  Temp:  36.6 C  SpO2:  93%    Complications: No apparent anesthesia complications

## 2020-07-25 NOTE — Anesthesia Preprocedure Evaluation (Signed)
Anesthesia Evaluation  Patient identified by MRN, date of birth, ID band Patient awake    Reviewed: NPO status , Patient's Chart, lab work & pertinent test results, reviewed documented beta blocker date and time   History of Anesthesia Complications (+) Emergence Delirium  Airway Mallampati: II  TM Distance: >3 FB Neck ROM: Full    Dental  (+) Teeth Intact   Pulmonary COPD,  COPD inhaler, Current Smoker,    Pulmonary exam normal        Cardiovascular Pt. on home beta blockers + dysrhythmias Atrial Fibrillation  Rhythm:Irregular Rate:Normal     Neuro/Psych negative neurological ROS  negative psych ROS   GI/Hepatic (+) Hepatitis -, Ccholedocholithiasis   Endo/Other  negative endocrine ROS  Renal/GU Renal diseasestones  negative genitourinary   Musculoskeletal  (+) Arthritis , Osteoarthritis,  Ankylosing spondylitis on chronic opioids   Abdominal (+)  Abdomen: soft. Bowel sounds: normal.  Peds  Hematology negative hematology ROS (+)   Anesthesia Other Findings   Reproductive/Obstetrics                             Anesthesia Physical Anesthesia Plan  ASA: III  Anesthesia Plan: General   Post-op Pain Management:    Induction: Intravenous  PONV Risk Score and Plan: 1 and Ondansetron, Dexamethasone and Treatment may vary due to age or medical condition  Airway Management Planned: Mask and Oral ETT  Additional Equipment: None  Intra-op Plan:   Post-operative Plan: Extubation in OR  Informed Consent: I have reviewed the patients History and Physical, chart, labs and discussed the procedure including the risks, benefits and alternatives for the proposed anesthesia with the patient or authorized representative who has indicated his/her understanding and acceptance.     Dental advisory given  Plan Discussed with: CRNA  Anesthesia Plan Comments: (Lab Results      Component                 Value               Date                      WBC                      7.9                 07/25/2020                HGB                      10.7 (L)            07/25/2020                HCT                      32.4 (L)            07/25/2020                MCV                      103.5 (H)           07/25/2020                PLT  241                 07/25/2020           Lab Results      Component                Value               Date                      NA                       133 (L)             07/25/2020                K                        3.4 (L)             07/25/2020                CO2                      22                  07/25/2020                GLUCOSE                  108 (H)             07/25/2020                BUN                      34 (H)              07/25/2020                CREATININE               1.63 (H)            07/25/2020                CALCIUM                  7.5 (L)             07/25/2020                GFRNONAA                 46 (L)              07/25/2020                GFRAA                    >90                 01/04/2014          )        Anesthesia Quick Evaluation

## 2020-07-25 NOTE — Progress Notes (Signed)
°  Echocardiogram 2D Echocardiogram has been performed.  Justin [name] 07/25/2020, 2:47 PM

## 2020-07-26 ENCOUNTER — Encounter (HOSPITAL_COMMUNITY): Payer: Self-pay | Admitting: Gastroenterology

## 2020-07-26 DIAGNOSIS — K8031 Calculus of bile duct with cholangitis, unspecified, with obstruction: Secondary | ICD-10-CM

## 2020-07-26 LAB — COMPREHENSIVE METABOLIC PANEL
ALT: 125 U/L — ABNORMAL HIGH (ref 0–44)
AST: 63 U/L — ABNORMAL HIGH (ref 15–41)
Albumin: 2.6 g/dL — ABNORMAL LOW (ref 3.5–5.0)
Alkaline Phosphatase: 484 U/L — ABNORMAL HIGH (ref 38–126)
Anion gap: 14 (ref 5–15)
BUN: 28 mg/dL — ABNORMAL HIGH (ref 8–23)
CO2: 21 mmol/L — ABNORMAL LOW (ref 22–32)
Calcium: 7.7 mg/dL — ABNORMAL LOW (ref 8.9–10.3)
Chloride: 100 mmol/L (ref 98–111)
Creatinine, Ser: 1.54 mg/dL — ABNORMAL HIGH (ref 0.61–1.24)
GFR, Estimated: 49 mL/min — ABNORMAL LOW (ref >60)
Glucose, Bld: 140 mg/dL — ABNORMAL HIGH (ref 70–99)
Potassium: 4.2 mmol/L (ref 3.5–5.1)
Sodium: 135 mmol/L (ref 135–145)
Total Bilirubin: 3.6 mg/dL — ABNORMAL HIGH (ref 0.3–1.2)
Total Protein: 6.1 g/dL — ABNORMAL LOW (ref 6.5–8.1)

## 2020-07-26 LAB — CBC
HCT: 32.1 % — ABNORMAL LOW (ref 39.0–52.0)
Hemoglobin: 10.4 g/dL — ABNORMAL LOW (ref 13.0–17.0)
MCH: 34.6 pg — ABNORMAL HIGH (ref 26.0–34.0)
MCHC: 32.4 g/dL (ref 30.0–36.0)
MCV: 106.6 fL — ABNORMAL HIGH (ref 80.0–100.0)
Platelets: 239 10*3/uL (ref 150–400)
RBC: 3.01 MIL/uL — ABNORMAL LOW (ref 4.22–5.81)
RDW: 15.2 % (ref 11.5–15.5)
WBC: 7.2 10*3/uL (ref 4.0–10.5)
nRBC: 0 % (ref 0.0–0.2)

## 2020-07-26 LAB — HCV AB W REFLEX TO QUANT PCR: HCV Ab: >11 s/co ratio — ABNORMAL HIGH (ref 0.0–0.9)

## 2020-07-26 LAB — HCV RT-PCR, QUANT (NON-GRAPH): Hepatitis C Quantitation: NOT DETECTED IU/mL

## 2020-07-26 MED ORDER — RIVAROXABAN 20 MG PO TABS: 20.0000 mg | ORAL_TABLET | Freq: Every day | ORAL | Status: DC

## 2020-07-26 MED ORDER — DILTIAZEM HCL ER COATED BEADS 120 MG PO CP24
120.0000 mg | ORAL_CAPSULE | Freq: Every day | ORAL | Status: DC
  Administered 2020-07-26: 13:00:00 120 mg via ORAL
  Filled 2020-07-26: qty 1

## 2020-07-26 MED ORDER — RIVAROXABAN 15 MG PO TABS: 15.0000 mg | ORAL_TABLET | Freq: Every day | ORAL | Status: DC

## 2020-07-26 MED ORDER — DILTIAZEM HCL ER COATED BEADS 120 MG PO CP24: 120.0000 mg | ORAL_CAPSULE | Freq: Every day | ORAL | 1 refills | Status: DC

## 2020-07-26 MED ORDER — PANTOPRAZOLE SODIUM 40 MG IV SOLR
40.0000 mg | Freq: Once | INTRAVENOUS | Status: AC
  Administered 2020-07-26: 05:00:00 40 mg via INTRAVENOUS
  Filled 2020-07-26: qty 40

## 2020-07-26 MED ORDER — METOPROLOL SUCCINATE ER 25 MG PO TB24: 75.0000 mg | ORAL_TABLET | Freq: Every day | ORAL | 1 refills | Status: DC

## 2020-07-26 MED ORDER — AMOXICILLIN-POT CLAVULANATE 875-125 MG PO TABS: 1.0000 | ORAL_TABLET | Freq: Two times a day (BID) | ORAL | 0 refills | Status: AC

## 2020-07-26 NOTE — Discharge Summary (Signed)
Physician Discharge Summary  Justin [name] [name]:[id number] DOB: August 20, 1950 DOA: 07/23/2020  PCP: Charlane Ferretti, DO  Admit date: 07/23/2020 Discharge date: 07/26/2020  Admitted From: Home Disposition:  Home  Recommendations for Outpatient Follow-up:  1. Follow up with PCP in 1-2 weeks 2. Follow-up with cardiology in 1 week 3. Follow-up with gastroenterology 4. Please obtain BMP/CBC in one week 5. Please follow up on the following pending results:None  Home Health: No Equipment/Devices: None Discharge Condition: Stable CODE STATUS: Full Diet recommendation: Heart Healthy / Carb Modified   Brief/Interim Summary: Justin [name] a 69 y.o.malewith medical history significant of A. fib on Eliquis, COPD on Symbicort and albuterol as needed, chronic back pain and ankylosing spondylitis on opioids who has a history of elevated LFTs and a history of hepatitis C exposure although RNA is negative, sp chocystectomy, Presented with1 week history of right upper quadrant pain, found to have worsening transaminitis, and T bili. Patient also recently started some Chinese supplement for his breathing issues. Also having some nausea and vomiting.  CT abdomen with concern of CBD extrahepatic bile duct.  Concern of cholangitis.  Admitted with sepsis secondary to cholangitis.  Procalcitonin was elevated at 6.81.  Elevated liver enzymes which started improving before discharge.   He was started on Zosyn and GI was consulted, patient underwent ERCP with GI and a successful sphincterotomy with removal of multiple bile stones were performed.  Patient tolerated the procedure well.  Pain improved and he was able to tolerate diet well.  Patient received 3 days of Zosyn and discharged on 1 week of Augmentin and he will follow-up with gastroenterology as an outpatient.  Patient with history of A. fib, remained in A. fib with RVR, initially difficult to control heart rate, he was started on Cardizem infusion  and cardiology was consulted.  Rate in low 100s on discharge but patient really wants to go home and after discussing with cardiology he was started on p.o. Cardizem, metoprolol dose was increased and he will continue his Xarelto.  He will follow-up with cardiology in 1 week.  He might need DCCV if remained in A. fib which can be arranged as an outpatient.  Patient did had AKI on admission secondary to sepsis with cholangitis.  Some improvement in his kidney function before discharge.  He was advised to continue holding his losartan till he had another BMP and his providers can restart.  Patient had hypokalemia and hypomagnesemia during current admission which were repleted.  We will continue with rest of his home meds and follow-up with his providers.  Discharge Diagnoses:  Active Problems:   Chronic pain   Calculus of bile duct with cholangitis and obstruction   AKI (acute kidney injury) (HCC)   History of hepatitis C   Atrial fibrillation with RVR [facility])  Discharge Instructions  Discharge Instructions    Diet - low sodium heart healthy   Complete by: As directed    Discharge instructions   Complete by: As directed    It was pleasure taking care of you. Because of your persistent high heart rate your cardiologist start you on a new medication called Cardizem and we increased the dose of metoprolol from 50 mg to 75 mg daily, please take it as directed and follow-up with your cardiologist within 1 week. Keep yourself well-hydrated. Keep holding your losartan for next few days till you will see your cardiologist and then you can resume it after discussing with them, you will need a repeat kidney function  testing before restarting losartan. You are also being given antibiotics for 7 more days to complete a total of 10-day course, take it as directed with meals, these antibiotics can cause some upset stomach and taking with meal should help. Please follow-up with your primary care provider,  gastroenterologist and cardiologist.   Increase activity slowly   Complete by: As directed      Allergies as of 07/26/2020      Reactions   Indocin [indomethacin] Other (See Comments)   Severe headaches.    Norco [hydrocodone-acetaminophen] Nausea And Vomiting      Medication List    STOP taking these medications   oxyCODONE-acetaminophen 5-325 MG tablet Commonly known as: Roxicet     TAKE these medications   albuterol 108 (90 Base) MCG/ACT inhaler Commonly known as: VENTOLIN HFA Inhale 2 puffs into the lungs every 4 (four) hours as needed for wheezing or shortness of breath.   amoxicillin-clavulanate 875-125 MG tablet Commonly known as: AUGMENTIN Take 1 tablet by mouth 2 (two) times daily for 7 days.   diltiazem 120 MG 24 hr capsule Commonly known as: CARDIZEM CD Take 1 capsule (120 mg total) by mouth daily. Start taking on: July 27, 2020   docusate sodium 100 MG capsule Commonly known as: Colace Take 1 capsule (100 mg total) by mouth 2 (two) times daily.   losartan 100 MG tablet Commonly known as: COZAAR Take 100 mg by mouth daily.   metoprolol succinate 25 MG 24 hr tablet Commonly known as: TOPROL-XL Take 3 tablets (75 mg total) by mouth daily. Start taking on: July 27, 2020 What changed:   medication strength  how much to take   ondansetron 4 MG disintegrating tablet Commonly known as: Zofran ODT Take 1 tablet (4 mg total) by mouth every 8 (eight) hours as needed.   OxyCONTIN 30 MG 12 hr tablet Generic drug: oxyCODONE Take 1 tablet by mouth 2 (two) times daily.   polyethylene glycol 17 g packet Commonly known as: MIRALAX / GLYCOLAX Take 17 g by mouth daily.   Symbicort 160-4.5 MCG/ACT inhaler Generic drug: budesonide-formoterol Inhale 2 puffs into the lungs in the morning and at bedtime.   Xarelto 20 MG Tabs tablet Generic drug: rivaroxaban Take 20 mg by mouth at bedtime.       Follow-up Information    Hilty, Lisette Abu, MD Follow  up.   Specialty: Cardiology Why: Hospital follow-up with Cardiology scheduled for 08/23/2019 at 10:45am. Please arrive 15 minutes early for check-in. If this date/time does not work for you, please call our office to reschedule.  Contact information: [address] [place] [address] [phone]        Charlane Ferretti, DO. Schedule an appointment as soon as possible for a visit in 1 week(s).   Specialty: Internal Medicine Contact information: [address] [place] [address] [phone]        Vida Rigger, MD. Schedule an appointment as soon as possible for a visit in 2 day(s).   Specialty: Gastroenterology Contact information: [address] [place] [address] [phone]              Allergies  Allergen Reactions  . Indocin [Indomethacin] Other (See Comments)    Severe headaches.   Awanda Mink [Hydrocodone-Acetaminophen] Nausea And Vomiting    Consultations:  GI  Cardiology  Procedures/Studies: CT ABDOMEN PELVIS W CONTRAST  Addendum Date: 07/23/2020   ADDENDUM REPORT: 07/23/2020 20:46 ADDENDUM: These results were called by telephone at  the time of interpretation on 07/23/2020 at 8:43 pm to provider [facility] , who verbally acknowledged these results. Electronically Signed   By: Helyn Numbers MD   On: 07/23/2020 20:46   Result Date: 07/23/2020 CLINICAL DATA:  Right lower quadrant abdominal pain EXAM: CT ABDOMEN AND PELVIS WITH CONTRAST TECHNIQUE: Multidetector CT imaging of the abdomen and pelvis was performed using the standard protocol following bolus administration of intravenous contrast. CONTRAST:  OMNIPAQUE IOHEXOL 300 MG/ML  SOLN COMPARISON:  12/02/2006 FINDINGS: Lower chest: The visualized lung bases are clear bilaterally. Mild coronary artery calcification. Cardiac size is at the upper limits of normal. No pericardial effusion. Dorsal column stimulator leads extend superior to the upper margin of the  examination. Hepatobiliary: Cholecystectomy has been performed. There is marked extrahepatic biliary ductal dilation with a 16 mm x 13 mm stone identified within the a distal duct in keeping with choledocholithiasis. Moderate periductal inflammatory stranding is present and there is enhancement of the biliary wall suggesting changes of superimposed cholangitis. No intrahepatic biliary ductal dilation. Mild to moderate hepatic steatosis. Portal vein is patent. Pancreas: Unremarkable Spleen: Unremarkable Adrenals/Urinary Tract: Adrenal glands are unremarkable. Kidneys are normal, without renal calculi, focal lesion, or hydronephrosis. Bladder is unremarkable. Stomach/Bowel: Severe sigmoid diverticulosis. Diverticulum noted arising from the gastric fundus. The stomach, small bowel, and large bowel are otherwise unremarkable. No evidence of obstruction or focal inflammation. Appendectomy has been performed. Small fat containing umbilical hernia. No free intraperitoneal gas or fluid. Vascular/Lymphatic: Moderate aortoiliac atherosclerotic calcification. No aortic aneurysm. No pathologic adenopathy within the abdomen and pelvis. Reproductive: Prostate is unremarkable. Other: Rectum unremarkable Musculoskeletal: Dorsal column stimulator battery pack noted within the left gluteal subcutaneous soft tissues. Degenerative changes are seen within the lumbar spine. No acute bone abnormality. IMPRESSION: Choledocholithiasis with a 16 mm x 13 mm calculus identified within the distal common duct. Marked extrahepatic biliary ductal dilation. Periductal inflammatory changes are also identified suggesting superimposed cholangitis. Aortic Atherosclerosis (ICD10-I70.0). Electronically Signed: By: Helyn Numbers MD On: 07/23/2020 20:40   DG ERCP BILIARY & PANCREATIC DUCTS  Result Date: 07/25/2020 CLINICAL DATA:  69 year old male with a history of choledocholithiasis EXAM: ERCP TECHNIQUE: Multiple spot images obtained with the  fluoroscopic device and submitted for interpretation post-procedure. FLUOROSCOPY TIME:  Fluoroscopy Time:  18 minutes 36 second COMPARISON:  None. FINDINGS: Limited images during ERCP. Initial image demonstrates the endoscope projecting over the upper abdomen. Subsequently there is placement of safety wire cross the ampulla with partial opacification of the extrahepatic biliary ducts. Deployment of a retrieval balloon. IMPRESSION: Limited images during ERCP demonstrate treatment choledocholithiasis with deployment of retrieval balloon. Please refer to the dictated operative report for full details of intraoperative findings and procedure. Electronically Signed   By: Gilmer Mor D.O.   On: 07/25/2020 15:42   ECHOCARDIOGRAM COMPLETE  Result Date: 07/25/2020    ECHOCARDIOGRAM REPORT   Patient Name:   Justin [name] Smart Date of Exam: 07/25/2020 Medical Rec #:  [id number]        Height:       71.0 in Accession #:    [id number]       Weight:       199.0 lb Date of Birth:  1951/04/21        BSA:          2.104 m Patient Age:    69 years         BP:           159/112 mmHg Patient  Gender: M                HR:           127 bpm. Exam Location:  Inpatient Procedure: 2D Echo, Cardiac Doppler and Color Doppler Indications:    R01.1 Murmur  History:        Patient has no prior history of Echocardiogram examinations.                 Abnormal ECG; Arrythmias:Atrial Fibrillation.  Sonographer:    Sheralyn Boatmanina West RDCS Referring Phys: [id number] ANASTASSIA DOUTOVA  Sonographer Comments: Technically difficult study due to poor echo windows. Image acquisition challenging due to patient body habitus. IMPRESSIONS  1. Left ventricular ejection fraction, by estimation, is 50 to 55%. The left ventricle has low normal function. The left ventricle has no regional wall motion abnormalities. There is moderate left ventricular hypertrophy. Left ventricular diastolic function could not be evaluated.  2. Right ventricular systolic function is normal. The  right ventricular size is normal.  3. Left atrial size was moderately dilated.  4. Right atrial size was mildly dilated.  5. The mitral valve is normal in structure. Trivial mitral valve regurgitation. No evidence of mitral stenosis.  6. The aortic valve is tricuspid. Aortic valve regurgitation is not visualized. Mild aortic valve sclerosis is present, with no evidence of aortic valve stenosis.  7. Aortic dilatation noted. There is mild dilatation of the aortic root, measuring 40 mm. There is mild dilatation of the ascending aorta, measuring 39 mm.  8. The inferior vena cava is dilated in size with >50% respiratory variability, suggesting right atrial pressure of 8 mmHg. FINDINGS  Left Ventricle: Left ventricular ejection fraction, by estimation, is 50 to 55%. The left ventricle has low normal function. The left ventricle has no regional wall motion abnormalities. The left ventricular internal cavity size was normal in size. There is moderate left ventricular hypertrophy. Left ventricular diastolic function could not be evaluated due to atrial fibrillation. Left ventricular diastolic function could not be evaluated. Right Ventricle: The right ventricular size is normal. Right ventricular systolic function is normal. Left Atrium: Left atrial size was moderately dilated. Right Atrium: Right atrial size was mildly dilated. Pericardium: There is no evidence of pericardial effusion. Mitral Valve: The mitral valve is normal in structure. Trivial mitral valve regurgitation. No evidence of mitral valve stenosis. Tricuspid Valve: The tricuspid valve is normal in structure. Tricuspid valve regurgitation is trivial. No evidence of tricuspid stenosis. Aortic Valve: The aortic valve is tricuspid. Aortic valve regurgitation is not visualized. Mild aortic valve sclerosis is present, with no evidence of aortic valve stenosis. Pulmonic Valve: The pulmonic valve was normal in structure. Pulmonic valve regurgitation is trivial. No  evidence of pulmonic stenosis. Aorta: Aortic dilatation noted. There is mild dilatation of the aortic root, measuring 40 mm. There is mild dilatation of the ascending aorta, measuring 39 mm. Venous: The inferior vena cava is dilated in size with greater than 50% respiratory variability, suggesting right atrial pressure of 8 mmHg.   LEFT VENTRICLE PLAX 2D LVIDd:         4.60 cm LVIDs:         3.60 cm LV PW:         2.10 cm LV IVS:        1.60 cm LVOT diam:     2.30 cm LV SV:         69 LV SV Index:   33 LVOT Area:  4.15 cm  LV Volumes (MOD) LV vol d, MOD A2C: 82.1 ml LV vol d, MOD A4C: 113.0 ml LV vol s, MOD A2C: 47.6 ml LV vol s, MOD A4C: 39.2 ml LV SV MOD A2C:     34.5 ml LV SV MOD A4C:     113.0 ml LV SV MOD BP:      60.1 ml RIGHT VENTRICLE         IVC TAPSE (M-mode): 1.6 cm  IVC diam: 2.60 cm LEFT ATRIUM             Index       RIGHT ATRIUM           Index LA diam:        5.30 cm 2.52 cm/m  RA Area:     25.30 cm LA Vol (A2C):   83.8 ml 39.83 ml/m RA Volume:   64.00 ml  30.42 ml/m LA Vol (A4C):   83.5 ml 39.68 ml/m LA Biplane Vol: 84.9 ml 40.35 ml/m  AORTIC VALVE LVOT Vmax:   99.80 cm/s LVOT Vmean:  75.400 cm/s LVOT VTI:    0.167 m  AORTA Ao Root diam: 4.00 cm Ao Asc diam:  3.90 cm MITRAL VALVE MV Area (PHT): 3.69 cm    SHUNTS MV Decel Time: 206 msec    Systemic VTI:  0.17 m MV E velocity: 82.13 cm/s  Systemic Diam: 2.30 cm Olga Millers MD Electronically signed by Olga Millers MD Signature Date/Time: 07/25/2020/3:08:57 PM    Final    US LIVER DOPPLER  Result Date: 07/19/2020 CLINICAL DATA:  69 year old with elevated LFTs. EXAM: DUPLEX ULTRASOUND OF LIVER TECHNIQUE: Color and duplex Doppler ultrasound was performed to evaluate the hepatic in-flow and out-flow vessels. COMPARISON:  CT abdomen 12/02/2006 FINDINGS: Liver: Increased echogenicity. Normal hepatic contour without nodularity. Liver is slightly heterogeneous. Slightly hypoechoic area in the right hepatic lobe probably represents fat  sparing and this area measures roughly 3.4 cm. There is no distinct mass lesion in the liver. Main Portal Vein size: 1.3 cm cm Portal Vein Velocities Main Prox:  49 cm/sec Main Mid: 28 cm/sec Main Dist:  25 cm/sec Right: 20 cm/sec Left: 20 cm/sec Hepatic Vein Velocities Right:  50 cm/sec Middle:  24 cm/sec Left:  20 cm/sec IVC: Present and patent with normal respiratory phasicity. Hepatic Artery Velocity:  74 cm/sec Splenic Vein Velocity:  12 cm/sec Spleen: 9.0 cm x 10.8 cm x 4.4 cm with a total volume of 220 cm^3 (411 cm^3 is upper limit normal) Portal Vein Occlusion/Thrombus: No Splenic Vein Occlusion/Thrombus: No Ascites: None Varices: None Normal hepatopetal flow in the portal veins. Normal hepatofugal flow in the hepatic veins. IMPRESSION: 1. Liver is slightly heterogeneous and has increased echogenicity. Findings are most compatible with hepatic steatosis. Probable focal fat sparing in the right hepatic lobe. 2. Portal veins are patent with normal direction of flow. Electronically Signed   By: Richarda Overlie M.D.   On: 07/19/2020 16:35   US Abdomen Limited RUQ (LIVER/GB)  Result Date: 07/23/2020 CLINICAL DATA:  Right upper quadrant abdominal pain EXAM: ULTRASOUND ABDOMEN LIMITED RIGHT UPPER QUADRANT COMPARISON:  07/19/2020, 12/20/2006 FINDINGS: Gallbladder: Absent Common bile duct: Diameter: Dilated measuring 12-13 mm in mid to distal diameter Liver: The liver parenchyma is diffusely heterogeneously increased in echogenicity and the echotexture is mildly coarsened in keeping with changes of probable moderate hepatic steatosis, stable since prior examination. There is no intrahepatic biliary ductal dilation identified. No intrahepatic mass lesion seen. Portal vein is patent  on color Doppler imaging with normal direction of blood flow towards the liver. Other: No ascites is present. IMPRESSION: Marked dilation of the extrahepatic bile duct. This appears stable since immediate prior examination, though  progressed since remote prior examination. While this may simply represent post cholecystectomy change, correlation with liver enzymes would be helpful to exclude an obstructive process. If abnormal, ERCP or MRCP examination would be more helpful to assess the distal duct. Moderate hepatic steatosis Status post cholecystectomy Electronically Signed   By: Helyn Numbers MD   On: 07/23/2020 19:25     Subjective: Patient was seen and examined during morning rounds today.  He will was feeling better.  No more abdominal pain, tolerating diet well.  He wants to go home.  Discharge Exam: Vitals:   07/26/20 0800 07/26/20 1200  BP: (!) 165/97   Pulse: (!) 108   Resp: 15   Temp: 98.5 F (36.9 C) 97.7 F (36.5 C)  SpO2: 98%    Vitals:   07/26/20 0500 07/26/20 0600 07/26/20 0800 07/26/20 1200  BP:  139/85 (!) 165/97   Pulse: (!) 31 (!) 38 (!) 108   Resp: 13 18 15    Temp:   98.5 F (36.9 C) 97.7 F (36.5 C)  TempSrc:   Oral Oral  SpO2: 96% 97% 98%   Weight:      Height:        General: Pt is alert, awake, not in acute distress Cardiovascular: Irregularly irregular with tachycardia. Respiratory: CTA bilaterally, no wheezing, no rhonchi Abdominal: Soft, NT, ND, bowel sounds + Extremities: no edema, no cyanosis   The results of significant diagnostics from this hospitalization (including imaging, microbiology, ancillary and laboratory) are listed below for reference.    Microbiology: Recent Results (from the past 240 hour(s))  Blood culture (routine x 2)     Status: None (Preliminary result)   Collection Time: 07/23/20  9:27 PM   Specimen: BLOOD RIGHT FOREARM  Result Value Ref Range Status   Specimen Description   Final    BLOOD RIGHT FOREARM Performed at [facility], [address] [place], [place] [address]    Special Requests   Final    BOTTLES DRAWN AEROBIC AND ANAEROBIC Blood Culture adequate volume Performed at [facility], [address] [place], [place] [address]    Culture   Final    NO GROWTH 3 DAYS Performed at [facility], [address], [place], [place] [address]    Report Status PENDING  Incomplete  Blood culture (routine x 2)     Status: None (Preliminary result)   Collection Time: 07/23/20  9:27 PM   Specimen: BLOOD LEFT FOREARM  Result Value Ref Range Status   Specimen Description   Final    BLOOD LEFT FOREARM Performed at [facility], [address] [place], [place] [address]    Special Requests   Final    BOTTLES DRAWN AEROBIC AND ANAEROBIC Blood Culture adequate volume Performed at [facility], [address], [place], [place] [address]    Culture   Final    NO GROWTH 3 DAYS Performed at [facility], [address] [place], [place] [address]    Report Status PENDING  Incomplete  Resp Panel by RT-PCR (Flu A&B, Covid) Nasopharyngeal Swab     Status: None   Collection Time: 07/23/20  9:27 PM   Specimen: Nasopharyngeal Swab; Nasopharyngeal(NP) swabs in vial transport medium  Result  Value Ref Range Status   SARS Coronavirus 2 by RT PCR NEGATIVE NEGATIVE Final    Comment: (NOTE) SARS-CoV-2 target nucleic acids are NOT DETECTED.  The SARS-CoV-2 RNA is generally detectable in upper respiratory specimens during the acute phase of infection. The lowest concentration of SARS-CoV-2 viral copies this assay can detect is 138 copies/mL. A negative result does not preclude SARS-Cov-2 infection and should not be used as the sole basis for treatment or other patient management decisions. A negative result may occur with  improper specimen collection/handling, submission of specimen other than nasopharyngeal swab, presence of viral mutation(s) within the areas targeted by this assay, and inadequate number of viral copies(<138 copies/mL). A negative result must be combined with clinical observations, patient history, and epidemiological information.  The expected result is Negative.  Fact Sheet for Patients:  BloggerCourse.com  Fact Sheet for Healthcare Providers:  SeriousBroker.it  This test is no t yet approved or cleared by the [place] FDA and  has been authorized for detection and/or diagnosis of SARS-CoV-2 by FDA under an Emergency Use Authorization (EUA). This EUA will remain  in effect (meaning this test can be used) for the duration of the COVID-19 declaration under Section 564(b)(1) of the Act, 21 U.S.C.section 360bbb-3(b)(1), unless the authorization is terminated  or revoked sooner.       Influenza A by PCR NEGATIVE NEGATIVE Final   Influenza B by PCR NEGATIVE NEGATIVE Final    Comment: (NOTE) The Xpert Xpress SARS-CoV-2/FLU/RSV plus assay is intended as an aid in the diagnosis of influenza from Nasopharyngeal swab specimens and should not be used as a sole basis for treatment. Nasal washings and aspirates are unacceptable for Xpert Xpress SARS-CoV-2/FLU/RSV testing.  Fact Sheet for Patients: BloggerCourse.com  Fact Sheet for Healthcare Providers: SeriousBroker.it  This test is not yet approved or cleared by the [place] FDA and has been authorized for detection and/or diagnosis of SARS-CoV-2 by FDA under an Emergency Use Authorization (EUA). This EUA will remain in effect (meaning this test can be used) for the duration of the COVID-19 declaration under Section 564(b)(1) of the Act, 21 U.S.C. section 360bbb-3(b)(1), unless the authorization is terminated or revoked.  Performed at [facility], [address], [place], [place] [address]   MRSA PCR Screening     Status: None   Collection Time: 07/24/20  4:55 PM   Specimen: Nasal Mucosa; Nasopharyngeal  Result Value Ref Range Status   MRSA by PCR NEGATIVE NEGATIVE Final    Comment:        The GeneXpert MRSA Assay (FDA approved  for NASAL specimens only), is one component of a comprehensive MRSA colonization surveillance program. It is not intended to diagnose MRSA infection nor to guide or monitor treatment for MRSA infections. Performed at [facility], [address], [place], [place] [address]      Labs: BNP (last 3 results) No results for input(s): BNP in the last 8760 hours. Basic Metabolic Panel: Recent Labs  Lab 07/23/20 1537 07/23/20 2230 07/24/20 0038 07/25/20 0256 07/26/20 0300  NA 137  --  137 133* 135  K 3.9  --  4.4 3.4* 4.2  CL 93*  --  98 100 100  CO2 30  --  25 22 21*  GLUCOSE 141*  --  101* 108* 140*  BUN 27*  --  31* 34* 28*  CREATININE 1.45*  --  0.82 1.63* 1.54*  CALCIUM 9.3  --  8.2* 7.5* 7.7*  MG  --  1.7 1.7 1.7  --   PHOS  --   --  3.9  --   --    Liver Function Tests: Recent Labs  Lab 07/23/20 1537 07/24/20 0038 07/25/20 0256 07/26/20 0300  AST 280* 405* 155* 63*  ALT 235* 261* 193* 125*  ALKPHOS 818* 712* 606* 484*  BILITOT 17.9* 14.7* 5.7* 3.6*  PROT 7.8 6.1* 6.0* 6.1*  ALBUMIN 3.3* 2.7* 2.5* 2.6*   Recent Labs  Lab 07/23/20 1537  LIPASE 21   No results for input(s): AMMONIA in the last 168 hours. CBC: Recent Labs  Lab 07/23/20 1537 07/24/20 0038 07/25/20 0256 07/26/20 0300  WBC 23.1* 15.0* 7.9 7.2  NEUTROABS  --  13.3* 6.3  --   HGB 14.1 11.9* 10.7* 10.4*  HCT 41.0 35.1* 32.4* 32.1*  MCV 101.2* 102.0* 103.5* 106.6*  PLT 302 281 241 239   Cardiac Enzymes: Recent Labs  Lab 07/23/20 2230  CKTOTAL 84   BNP: Invalid input(s): POCBNP CBG: No results for input(s): GLUCAP in the last 168 hours. D-Dimer No results for input(s): DDIMER in the last 72 hours. Hgb A1c No results for input(s): HGBA1C in the last 72 hours. Lipid Profile No results for input(s): CHOL, HDL, LDLCALC, TRIG, CHOLHDL, LDLDIRECT in the last 72 hours. Thyroid function studies Recent Labs    07/24/20 0416  TSH 0.745   Anemia work up Recent Labs     07/25/20 0256 07/25/20 0916  VITAMINB12  --  >7,500*  FOLATE  --  7.7  FERRITIN  --  936*  TIBC  --  234*  IRON  --  137  RETICCTPCT 2.7  --    Urinalysis    Component Value Date/Time   COLORURINE AMBER (A) 07/23/2020 1800   APPEARANCEUR HAZY (A) 07/23/2020 1800   LABSPEC 1.027 07/23/2020 1800   PHURINE 6.0 07/23/2020 1800   GLUCOSEU NEGATIVE 07/23/2020 1800   HGBUR NEGATIVE 07/23/2020 1800   BILIRUBINUR MODERATE (A) 07/23/2020 1800   KETONESUR NEGATIVE 07/23/2020 1800   PROTEINUR 100 (A) 07/23/2020 1800   UROBILINOGEN 0.2 01/03/2013 1520   NITRITE NEGATIVE 07/23/2020 1800   LEUKOCYTESUR NEGATIVE 07/23/2020 1800   Sepsis Labs Invalid input(s): PROCALCITONIN,  WBC,  LACTICIDVEN Microbiology Recent Results (from the past 240 hour(s))  Blood culture (routine x 2)     Status: None (Preliminary result)   Collection Time: 07/23/20  9:27 PM   Specimen: BLOOD RIGHT FOREARM  Result Value Ref Range Status   Specimen Description   Final    BLOOD RIGHT FOREARM Performed at [facility], [address] [place], [place] [address]    Special Requests   Final    BOTTLES DRAWN AEROBIC AND ANAEROBIC Blood Culture adequate volume Performed at [facility], [address] [place], [place] [address]    Culture   Final    NO GROWTH 3 DAYS Performed at [facility], [address], [place], [place] [address]    Report Status PENDING  Incomplete  Blood culture (routine x 2)     Status: None (Preliminary result)   Collection Time: 07/23/20  9:27 PM   Specimen: BLOOD LEFT FOREARM  Result Value Ref Range Status   Specimen Description   Final    BLOOD LEFT FOREARM Performed at [facility], [address], [place], [place] [address]    Special Requests   Final    BOTTLES DRAWN AEROBIC AND ANAEROBIC Blood Culture adequate volume Performed at [facility], [address], [place], [place] [address]     Culture   Final    NO GROWTH 3 DAYS Performed at [facility], [address], [place], [place] [address]    Report Status PENDING  Incomplete  Resp Panel by RT-PCR (Flu A&B, Covid) Nasopharyngeal Swab     Status: None   Collection Time: 07/23/20  9:27 PM   Specimen: Nasopharyngeal Swab; Nasopharyngeal(NP) swabs in vial transport medium  Result Value Ref Range Status   SARS Coronavirus 2 by RT PCR NEGATIVE NEGATIVE Final    Comment: (NOTE) SARS-CoV-2 target nucleic acids are NOT DETECTED.  The SARS-CoV-2 RNA is generally detectable in upper respiratory specimens during the acute phase of infection. The lowest concentration of SARS-CoV-2 viral copies this assay can detect is 138 copies/mL. A negative result does not preclude SARS-Cov-2 infection and should not be used as the sole basis for treatment or other patient management decisions. A negative result may occur with  improper specimen collection/handling, submission of specimen other than nasopharyngeal swab, presence of viral mutation(s) within the areas targeted by this assay, and inadequate number of viral copies(<138 copies/mL). A negative result must be combined with clinical observations, patient history, and epidemiological information. The expected result is Negative.  Fact Sheet for Patients:  BloggerCourse.com  Fact Sheet for Healthcare Providers:  SeriousBroker.it  This test is no t yet approved or cleared by the [place] FDA and  has been authorized for detection and/or diagnosis of SARS-CoV-2 by FDA under an Emergency Use Authorization (EUA). This EUA will remain  in effect (meaning this test can be used) for the duration of the COVID-19 declaration under Section 564(b)(1) of the Act, 21 U.S.C.section 360bbb-3(b)(1), unless the authorization is terminated  or revoked sooner.       Influenza A by PCR NEGATIVE NEGATIVE Final   Influenza B by PCR  NEGATIVE NEGATIVE Final    Comment: (NOTE) The Xpert Xpress SARS-CoV-2/FLU/RSV plus assay is intended as an aid in the diagnosis of influenza from Nasopharyngeal swab specimens and should not be used as a sole basis for treatment. Nasal washings and aspirates are unacceptable for Xpert Xpress SARS-CoV-2/FLU/RSV testing.  Fact Sheet for Patients: BloggerCourse.com  Fact Sheet for Healthcare Providers: SeriousBroker.it  This test is not yet approved or cleared by the [place] FDA and has been authorized for detection and/or diagnosis of SARS-CoV-2 by FDA under an Emergency Use Authorization (EUA). This EUA will remain in effect (meaning this test can be used) for the duration of the COVID-19 declaration under Section 564(b)(1) of the Act, 21 U.S.C. section 360bbb-3(b)(1), unless the authorization is terminated or revoked.  Performed at [facility], [address], [place], [place] [address]   MRSA PCR Screening     Status: None   Collection Time: 07/24/20  4:55 PM   Specimen: Nasal Mucosa; Nasopharyngeal  Result Value Ref Range Status   MRSA by PCR NEGATIVE NEGATIVE Final    Comment:        The GeneXpert MRSA Assay (FDA approved for NASAL specimens only), is one component of a comprehensive MRSA colonization surveillance program. It is not intended to diagnose MRSA infection nor to guide or monitor treatment for MRSA infections. Performed at [facility], [address], [place], [place] [address]     Time coordinating discharge: Over 30 minutes  SIGNED:  Arnetha Courser, MD  [facility] 07/26/2020, 3:21 PM  If 7PM-7AM, please contact night-coverage [email or web address]  This record has been created using Systems analyst. Errors have been sought and corrected,but may not always be located. Such creation errors do not reflect on the standard of care.

## 2020-07-26 NOTE — Progress Notes (Signed)
ERCP accomplished successfully yesterday, with complete clearance of the common bile duct of multiple stones.  This morning, WBC remains normal, and LFT's are about 50% improved.  Hgb stable.  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  Okay for resumption of anticoagulation.  Okay for discharge from the GI tract standpoint.  We will sign off, but please call us if you have any questions.  I have left a message at our office to call the patient next week and arrange a follow-up office visit with Dr. Vida Rigger in the next month or so, to confirm normalization of his liver chemistries and to check the result of his hepatitis C viral quantitation.  Florencia Reasons, M.D. Pager [phone] If no answer or after 5 PM call [phone]

## 2020-07-26 NOTE — Consult Note (Signed)
Cardiology Consultation:   Patient ID: Justin [name] MRN: [id number]; DOB: 12/31/1950  Admit date: 07/23/2020 Date of Consult: 07/26/2020  Primary Care Provider: Sueanne Margarita, [place] Cardiologist: New (Dr. Debara Pickett) [place] Electrophysiologist:  None    Patient Profile:   Justin [name] is a 69 y.o. male with a history of paroxysmal atrial fibrillation on Xarelto, hypertension, asthma, chronic back pain from ankylosing spondylitis on opioids, and s/p cholecystectomy who is being seen today for the evaluation of atrial fibrillation with RVR in the setting of sepsis secondary to cholangitis at the request of Dr. Reesa Chew .  History of Present Illness:   Justin [name] is a 69 year old male with the above history.  Patient reports being diagnosed with atrial fibrillation several years ago by his PCP who has been managing this.  He has never seen a cardiologist or had any type of cardiac work-up.  He also has history of hypertension but no known hyperlipidemia or diabetes. He reports prior smoking history but quit about 10 years ago no alcohol or drug use.  He denies any family history of cardiovascular disease.  Patient presented to the ED on 07/23/2020 for further evaluation of right upper quadrant pain and vomiting.  Symptoms first started about 1 week prior to presentation and then went away.  Symptoms returned on 07/22/2020 therefore he decided to come to the ED for further evaluation.  In the ED, EKG showed atrial fibrillation with rates in the 140's, RBBB, T wave inversions in V2-V4, and ST depression in leads I and V5-V6. WBC 23.1, Hgb 14.1, Plts 302. Na 137, K 3.9, Glucose 141, BUN 27, Cr 1.45. Albumin 3.3, AST 280, ALT 23, Alk Phos 818, Total Bili 17.9. Lipase normal. Hepatitis panel was reactive for HCV antibody. Lactic acid normal. Right upper quadrant ultrasound showed marked dilatation of the extrahepatic bile duct and moderate hepatic steatosis. Abdominal/pelvic CT  showed choledocholithiasis with 26m x17mcalculus identified within the distal common duct, marked extrahepatic biliary ductal dilatation, and periductal inflammatory changes suggesting superimposed cholangitis. Patient was admitted with sepsis secondary to cholangitis and started on IV fluids and antibiotics. GI was consulted. ERCP was performed on 07/25/2020 - choledocholithiasis was found and partial removal was accomplished by biliary sphincterotomy and patient underwent successful lithotripsy. The biliary tree was swept multiple times and no significant stones were seen at the end of the procedure. Patient remained in atrial fibrillation with rates mostly in the 130's. Therefore, he was started on IV Cardizem and Cardiology was consulted.   At the time of this evaluation, patient resting comfortably in no acute distress.  He remains in atrial fibrillation.  Rates ranging from the 80s to 100's.  He is completely asymptomatic from a cardiac standpoint.  No palpitations, chest pain, shortness of breath, orthopnea, PND, lower extremity edema, lightheadedness, dizziness, syncope.  H he still has some mild right upper quadrant cramping but much improved from before.  No recurrent nausea/vomiting.  He states his urine was dark due to the choledocholithiasis proving.  No abnormal bleeding noted in urine or stools.  Past Medical History:  Diagnosis Date  . Angioedema 03/28/2012   "tongue and throat"- was never known why"was taking humira at the time.  . Arthritis    "hands""back"  . Asthma    treated for over [id number]  . Chronic back pain    scoliosis/spondylosis  . Complication of anesthesia    may try fight when waking up  . History of bronchitis 10+yrs  ago  . History of colon polyps   . History of gout    last time many yrs ago  . History of kidney stones   . Joint pain   . Joint swelling   . Lyme disease    just completed treatment with Doxycycline   . Pneumonia 08/2013  . Spondylitis,  ankylosing ([place])   . Weakness    and numbness in right leg    Past Surgical History:  Procedure Laterality Date  . APPENDECTOMY    . BILIARY DILATION  07/25/2020   Procedure: BILIARY DILATION;  Surgeon: Clarene Essex, MD;  Location: [facility];  Service: Endoscopy;;  . CHOLECYSTECTOMY    . COLONOSCOPY    . ERCP N/A 07/25/2020   Procedure: ENDOSCOPIC RETROGRADE CHOLANGIOPANCREATOGRAPHY (ERCP);  Surgeon: Clarene Essex, MD;  Location: [facility];  Service: Endoscopy;  Laterality: N/A;  . ESOPHAGOGASTRODUODENOSCOPY    . knuckle surgery     right middle finger a couple of times  . LUMBAR LAMINECTOMY/DECOMPRESSION MICRODISCECTOMY Right 01/11/2013   Procedure: CENTRAL DECOMPRESSION LUMBAR LAMINECTOMYL4-L5 RIGHT, MICRODISCECTOMY L4-L5 RIGHT     (1 LEVEL);  Surgeon: Tobi Bastos, MD;  Location: [facility];  Service: Orthopedics;  Laterality: Right;  . REMOVAL OF STONES  07/25/2020   Procedure: REMOVAL OF STONES;  Surgeon: Clarene Essex, MD;  Location: [facility];  Service: Endoscopy;;  . SEPTOPLASTY    . SHOULDER OPEN ROTATOR CUFF REPAIR Left    x 2;impingement syndrome and torn rotator cuff  . SPHINCTEROTOMY  07/25/2020   Procedure: SPHINCTEROTOMY;  Surgeon: Clarene Essex, MD;  Location: [facility];  Service: Endoscopy;;  . SPINAL CORD STIMULATOR INSERTION N/A 01/04/2014   Procedure: LUMBAR SPINAL CORD STIMULATOR INSERTION;  Surgeon: Melina Schools, MD;  Location: [facility];  Service: Orthopedics;  Laterality: N/A;  . TONSILLECTOMY       Home Medications:  Prior to Admission medications   Medication Sig Start Date End Date Taking? Authorizing Provider  losartan (COZAAR) 100 MG tablet Take 100 mg by mouth daily. 06/27/20  Yes [provider]  metoprolol succinate (TOPROL-XL) 50 MG 24 hr tablet Take 50 mg by mouth daily. 06/05/20  Yes [provider]  OXYCONTIN 30 MG 12 hr tablet Take 1 tablet by mouth 2 (two) times daily. 07/11/20  Yes [provider]  SYMBICORT 160-4.5  MCG/ACT inhaler Inhale 2 puffs into the lungs in the morning and at bedtime. 07/03/20  Yes [provider]  XARELTO 20 MG TABS tablet Take 20 mg by mouth at bedtime. 07/03/20  Yes [provider]  albuterol (VENTOLIN HFA) 108 (90 Base) MCG/ACT inhaler Inhale 2 puffs into the lungs every 4 (four) hours as needed for wheezing or shortness of breath. 03/05/20   [provider]  docusate sodium (COLACE) 100 MG capsule Take 1 capsule (100 mg total) by mouth 2 (two) times daily. Patient not taking: No sig reported 01/05/14   Lanae Crumbly, PA-C  ondansetron (ZOFRAN ODT) 4 MG disintegrating tablet Take 1 tablet (4 mg total) by mouth every 8 (eight) hours as needed. Patient not taking: No sig reported 01/05/14   Lanae Crumbly, PA-C  oxyCODONE-acetaminophen (ROXICET) 5-325 MG per tablet Take 1 tablet by mouth every 6 (six) hours as needed for severe pain. Patient not taking: No sig reported 01/05/14   Lanae Crumbly, PA-C  polyethylene glycol [facility] / GLYCOLAX) packet Take 17 g by mouth daily. Patient not taking: No sig reported 01/05/14   Lanae Crumbly, PA-C  Inpatient Medications: Scheduled Meds: . Chlorhexidine Gluconate Cloth  6 each Topical Daily  . mouth rinse  15 mL Mouth Rinse BID  . metoprolol succinate  75 mg Oral Daily  . mometasone-formoterol  2 puff Inhalation BID  . Rivaroxaban  15 mg Oral Q supper   Continuous Infusions: . diltiazem (CARDIZEM) infusion Stopped (07/26/20 0651)  . lactated ringers 75 mL/hr at 07/26/20 0848  . piperacillin-tazobactam (ZOSYN)  IV Stopped (07/26/20 1002)   PRN Meds: acetaminophen, albuterol, HYDROmorphone (DILAUDID) injection, ondansetron (ZOFRAN) IV  Allergies:    Allergies  Allergen Reactions  . Indocin [Indomethacin] Other (See Comments)    Severe headaches.   Lebron Quam [Hydrocodone-Acetaminophen] Nausea And Vomiting    Social History:   Social History   Socioeconomic History  . Marital status: Married     Spouse name: Not on file  . Number of children: Not on file  . Years of education: Not on file  . Highest education level: Not on file  Occupational History  . Not on file  Tobacco Use  . Smoking status: Current Some Day Smoker    Types: Cigars  . Smokeless tobacco: Never Used  . Tobacco comment: quit smoking cigarettes 10-78yr ago  Substance and Sexual Activity  . Alcohol use: Yes    Comment: couple of beers a week  . Drug use: No  . Sexual activity: Yes  Other Topics Concern  . Not on file  Social History Narrative  . Not on file   Social Determinants of Health   Financial Resource Strain: Not on file  Food Insecurity: Not on file  Transportation Needs: Not on file  Physical Activity: Not on file  Stress: Not on file  Social Connections: Not on file  Intimate Partner Violence: Not on file    Family History:    Family History  Problem Relation Age of Onset  . Hypertension Other      ROS:  Please see the history of present illness.  All other ROS reviewed and negative.     Physical Exam/Data:   Vitals:   07/26/20 0400 07/26/20 0500 07/26/20 0600 07/26/20 0800  BP: 140/66  139/85 (!) 165/97  Pulse: (!) 37 (!) 31 (!) 38 (!) 108  Resp: 12 13 18 15   Temp: 97.8 F (36.6 C)   98.5 F (36.9 C)  TempSrc: Oral   Oral  SpO2: 98% 96% 97% 98%  Weight:      Height:        Intake/Output Summary (Last 24 hours) at 07/26/2020 1028 Last data filed at 07/26/2020 0848 Gross per 24 hour  Intake 3987.12 ml  Output 300 ml  Net 3687.12 ml   Last 3 Weights 07/23/2020 01/05/2014 01/04/2014  Weight (lbs) 199 lb 192 lb 192 lb  Weight (kg) 90.266 kg 87.091 kg 87.091 kg     Body mass index is 27.75 kg/m.  General: 69y.o. male resting comfortably in no acute distress.  HEENT: Normocephalic and atraumatic. Sclera clear. EOMs intact. Neck: Supple. No carotid bruits. No JVD. Heart: Tachycardic with irregularly irregular rhythm. Distinct S1 and S2. No murmurs, gallops, or  rubs. Radial  pulses 2+ and equal bilaterally. Lungs: No increased work of breathing. Clear to ausculation bilaterally. No wheezes, rhonchi, or rales.  Abdomen: Soft, non-distended, and non-tender to palpation.  MSK: Normal strength and tone for age. Extremities: No lower extremity edema.    Skin: Warm and dry. Neuro: Alert and oriented x3. No focal deficits. Psych: Normal affect. Responds appropriately.  EKG:  The EKG was personally reviewed and demonstrates:  Atrial fibrillation with rates in the 140's, RBBB, T wave inversions in V2-V4, and ST depression in leads I and V5-V6. ST/T changes improved with rate control.  Telemetry:  Telemetry was personally reviewed and demonstrates:  Atrial fibrillation with rates ranging from the 60's to 100's. Multiple pauses noted, longest one being 2.1 seconds. Apnea also noted on telemetry.  Relevant CV Studies:  Echocardiogram 07/25/2020: Impressions: 1. Left ventricular ejection fraction, by estimation, is 50 to 55%. The  left ventricle has low normal function. The left ventricle has no regional  wall motion abnormalities. There is moderate left ventricular hypertrophy.  Left ventricular diastolic  function could not be evaluated.  2. Right ventricular systolic function is normal. The right ventricular  size is normal.  3. Left atrial size was moderately dilated.  4. Right atrial size was mildly dilated.  5. The mitral valve is normal in structure. Trivial mitral valve  regurgitation. No evidence of mitral stenosis.  6. The aortic valve is tricuspid. Aortic valve regurgitation is not  visualized. Mild aortic valve sclerosis is present, with no evidence of  aortic valve stenosis.  7. Aortic dilatation noted. There is mild dilatation of the aortic root,  measuring 40 mm. There is mild dilatation of the ascending aorta,  measuring 39 mm.  8. The inferior vena cava is dilated in size with >50% respiratory  variability, suggesting right  atrial pressure of 8 mmHg.  Laboratory Data:  High Sensitivity Troponin:  No results for input(s): TROPONINIHS in the last 720 hours.   Chemistry Recent Labs  Lab 07/24/20 0038 07/25/20 0256 07/26/20 0300  NA 137 133* 135  K 4.4 3.4* 4.2  CL 98 100 100  CO2 25 22 21*  GLUCOSE 101* 108* 140*  BUN 31* 34* 28*  CREATININE 0.82 1.63* 1.54*  CALCIUM 8.2* 7.5* 7.7*  GFRNONAA >60 46* 49*  ANIONGAP 14 11 14     Recent Labs  Lab 07/24/20 0038 07/25/20 0256 07/26/20 0300  PROT 6.1* 6.0* 6.1*  ALBUMIN 2.7* 2.5* 2.6*  AST 405* 155* 63*  ALT 261* 193* 125*  ALKPHOS 712* 606* 484*  BILITOT 14.7* 5.7* 3.6*   Hematology Recent Labs  Lab 07/24/20 0038 07/25/20 0256 07/26/20 0300  WBC 15.0* 7.9 7.2  RBC 3.44* 3.13*  3.15* 3.01*  HGB 11.9* 10.7* 10.4*  HCT 35.1* 32.4* 32.1*  MCV 102.0* 103.5* 106.6*  MCH 34.6* 34.2* 34.6*  MCHC 33.9 33.0 32.4  RDW 15.9* 15.7* 15.2  PLT 281 241 239   BNPNo results for input(s): BNP, PROBNP in the last 168 hours.  DDimer No results for input(s): DDIMER in the last 168 hours.   Radiology/Studies:  CT ABDOMEN PELVIS W CONTRAST  Addendum Date: 07/23/2020   ADDENDUM REPORT: 07/23/2020 20:46 ADDENDUM: These results were called by telephone at the time of interpretation on 07/23/2020 at 8:43 pm to provider [facility] , who verbally acknowledged these results. Electronically Signed   By: Fidela Salisbury MD   On: 07/23/2020 20:46   Result Date: 07/23/2020 CLINICAL DATA:  Right lower quadrant abdominal pain EXAM: CT ABDOMEN AND PELVIS WITH CONTRAST TECHNIQUE: Multidetector CT imaging of the abdomen and pelvis was performed using the standard protocol following bolus administration of intravenous contrast. CONTRAST:  148m OMNIPAQUE IOHEXOL 300 MG/ML  SOLN COMPARISON:  12/02/2006 FINDINGS: Lower chest: The visualized lung bases are clear bilaterally. Mild coronary artery calcification. Cardiac size is at the upper limits of normal. No pericardial  effusion. Dorsal column stimulator leads extend superior to the upper margin of the examination. Hepatobiliary: Cholecystectomy has been performed. There is marked extrahepatic biliary ductal dilation with a 16 mm x 13 mm stone identified within the a distal duct in keeping with choledocholithiasis. Moderate periductal inflammatory stranding is present and there is enhancement of the biliary wall suggesting changes of superimposed cholangitis. No intrahepatic biliary ductal dilation. Mild to moderate hepatic steatosis. Portal vein is patent. Pancreas: Unremarkable Spleen: Unremarkable Adrenals/Urinary Tract: Adrenal glands are unremarkable. Kidneys are normal, without renal calculi, focal lesion, or hydronephrosis. Bladder is unremarkable. Stomach/Bowel: Severe sigmoid diverticulosis. Diverticulum noted arising from the gastric fundus. The stomach, small bowel, and large bowel are otherwise unremarkable. No evidence of obstruction or focal inflammation. Appendectomy has been performed. Small fat containing umbilical hernia. No free intraperitoneal gas or fluid. Vascular/Lymphatic: Moderate aortoiliac atherosclerotic calcification. No aortic aneurysm. No pathologic adenopathy within the abdomen and pelvis. Reproductive: Prostate is unremarkable. Other: Rectum unremarkable Musculoskeletal: Dorsal column stimulator battery pack noted within the left gluteal subcutaneous soft tissues. Degenerative changes are seen within the lumbar spine. No acute bone abnormality. IMPRESSION: Choledocholithiasis with a 16 mm x 13 mm calculus identified within the distal common duct. Marked extrahepatic biliary ductal dilation. Periductal inflammatory changes are also identified suggesting superimposed cholangitis. Aortic Atherosclerosis (ICD10-I70.0). Electronically Signed: By: Fidela Salisbury MD On: 07/23/2020 20:40   DG ERCP BILIARY & PANCREATIC DUCTS  Result Date: 07/25/2020 CLINICAL DATA:  69 year old male with a history of  choledocholithiasis EXAM: ERCP TECHNIQUE: Multiple spot images obtained with the fluoroscopic device and submitted for interpretation post-procedure. FLUOROSCOPY TIME:  Fluoroscopy Time:  18 minutes 36 second COMPARISON:  None. FINDINGS: Limited images during ERCP. Initial image demonstrates the endoscope projecting over the upper abdomen. Subsequently there is placement of safety wire cross the ampulla with partial opacification of the extrahepatic biliary ducts. Deployment of a retrieval balloon. IMPRESSION: Limited images during ERCP demonstrate treatment choledocholithiasis with deployment of retrieval balloon. Please refer to the dictated operative report for full details of intraoperative findings and procedure. Electronically Signed   By: Corrie Mckusick D.O.   On: 07/25/2020 15:42   ECHOCARDIOGRAM COMPLETE  Result Date: 07/25/2020    ECHOCARDIOGRAM REPORT   Patient Name:   MAVRIK BYNUM Gaida Date of Exam: 07/25/2020 Medical Rec #:  [id number]        Height:       71.0 in Accession #:    [id number]       Weight:       199.0 lb Date of Birth:  11/27/1950        BSA:          2.104 m Patient Age:    79 years         BP:           159/112 mmHg Patient Gender: M                HR:           127 bpm. Exam Location:  Inpatient Procedure: 2D Echo, Cardiac Doppler and Color Doppler Indications:    R01.1 Murmur  History:        Patient has no prior history of Echocardiogram examinations.                 Abnormal ECG; Arrythmias:Atrial Fibrillation.  Sonographer:    Roseanna Rainbow RDCS Referring Phys: [place]  Sonographer Comments: Technically difficult study due to poor echo windows. Image acquisition challenging  due to patient body habitus. IMPRESSIONS  1. Left ventricular ejection fraction, by estimation, is 50 to 55%. The left ventricle has low normal function. The left ventricle has no regional wall motion abnormalities. There is moderate left ventricular hypertrophy. Left ventricular diastolic function  could not be evaluated.  2. Right ventricular systolic function is normal. The right ventricular size is normal.  3. Left atrial size was moderately dilated.  4. Right atrial size was mildly dilated.  5. The mitral valve is normal in structure. Trivial mitral valve regurgitation. No evidence of mitral stenosis.  6. The aortic valve is tricuspid. Aortic valve regurgitation is not visualized. Mild aortic valve sclerosis is present, with no evidence of aortic valve stenosis.  7. Aortic dilatation noted. There is mild dilatation of the aortic root, measuring 40 mm. There is mild dilatation of the ascending aorta, measuring 39 mm.  8. The inferior vena cava is dilated in size with >50% respiratory variability, suggesting right atrial pressure of 8 mmHg. FINDINGS  Left Ventricle: Left ventricular ejection fraction, by estimation, is 50 to 55%. The left ventricle has low normal function. The left ventricle has no regional wall motion abnormalities. The left ventricular internal cavity size was normal in size. There is moderate left ventricular hypertrophy. Left ventricular diastolic function could not be evaluated due to atrial fibrillation. Left ventricular diastolic function could not be evaluated. Right Ventricle: The right ventricular size is normal. Right ventricular systolic function is normal. Left Atrium: Left atrial size was moderately dilated. Right Atrium: Right atrial size was mildly dilated. Pericardium: There is no evidence of pericardial effusion. Mitral Valve: The mitral valve is normal in structure. Trivial mitral valve regurgitation. No evidence of mitral valve stenosis. Tricuspid Valve: The tricuspid valve is normal in structure. Tricuspid valve regurgitation is trivial. No evidence of tricuspid stenosis. Aortic Valve: The aortic valve is tricuspid. Aortic valve regurgitation is not visualized. Mild aortic valve sclerosis is present, with no evidence of aortic valve stenosis. Pulmonic Valve: The pulmonic  valve was normal in structure. Pulmonic valve regurgitation is trivial. No evidence of pulmonic stenosis. Aorta: Aortic dilatation noted. There is mild dilatation of the aortic root, measuring 40 mm. There is mild dilatation of the ascending aorta, measuring 39 mm. Venous: The inferior vena cava is dilated in size with greater than 50% respiratory variability, suggesting right atrial pressure of 8 mmHg.   LEFT VENTRICLE PLAX 2D LVIDd:         4.60 cm LVIDs:         3.60 cm LV PW:         2.10 cm LV IVS:        1.60 cm LVOT diam:     2.30 cm LV SV:         69 LV SV Index:   33 LVOT Area:     4.15 cm  LV Volumes (MOD) LV vol d, MOD A2C: 82.1 ml LV vol d, MOD A4C: 113.0 ml LV vol s, MOD A2C: 47.6 ml LV vol s, MOD A4C: 39.2 ml LV SV MOD A2C:     34.5 ml LV SV MOD A4C:     113.0 ml LV SV MOD BP:      60.1 ml RIGHT VENTRICLE         IVC TAPSE (M-mode): 1.6 cm  IVC diam: 2.60 cm LEFT ATRIUM             Index       RIGHT ATRIUM  Index LA diam:        5.30 cm 2.52 cm/m  RA Area:     25.30 cm LA Vol (A2C):   83.8 ml 39.83 ml/m RA Volume:   64.00 ml  30.42 ml/m LA Vol (A4C):   83.5 ml 39.68 ml/m LA Biplane Vol: 84.9 ml 40.35 ml/m  AORTIC VALVE LVOT Vmax:   99.80 cm/s LVOT Vmean:  75.400 cm/s LVOT VTI:    0.167 m  AORTA Ao Root diam: 4.00 cm Ao Asc diam:  3.90 cm MITRAL VALVE MV Area (PHT): 3.69 cm    SHUNTS MV Decel Time: 206 msec    Systemic VTI:  0.17 m MV E velocity: 82.13 cm/s  Systemic Diam: 2.30 cm [name] MD Electronically signed by [name] MD Signature Date/Time: 07/25/2020/3:08:57 PM    Final    US Abdomen Limited RUQ (LIVER/GB)  Result Date: 07/23/2020 CLINICAL DATA:  Right upper quadrant abdominal pain EXAM: ULTRASOUND ABDOMEN LIMITED RIGHT UPPER QUADRANT COMPARISON:  07/19/2020, 12/20/2006 FINDINGS: Gallbladder: Absent Common bile duct: Diameter: Dilated measuring 12-13 mm in mid to distal diameter Liver: The liver parenchyma is diffusely heterogeneously increased in echogenicity  and the echotexture is mildly coarsened in keeping with changes of probable moderate hepatic steatosis, stable since prior examination. There is no intrahepatic biliary ductal dilation identified. No intrahepatic mass lesion seen. Portal vein is patent on color Doppler imaging with normal direction of blood flow towards the liver. Other: No ascites is present. IMPRESSION: Marked dilation of the extrahepatic bile duct. This appears stable since immediate prior examination, though progressed since remote prior examination. While this may simply represent post cholecystectomy change, correlation with liver enzymes would be helpful to exclude an obstructive process. If abnormal, ERCP or MRCP examination would be more helpful to assess the distal duct. Moderate hepatic steatosis Status post cholecystectomy Electronically Signed   By: Fidela Salisbury MD   On: 07/23/2020 19:25     Assessment and Plan:   Paroxysmal Atrial Fibrillation with RVR - History of atrial fibrillation diagnosed several years ago. Patient asymptomatic with it so unclear if paroxysmal or persistent. Current episode in setting of sepsis secondary to cholangitis. Rates reasonably well controlled.  - Potassium 4.2 today. - Magnesium 1.7 yesterday. - TSH normal.  - Echo showed LVEF of 50-55% with normal wall motion and mild dilatation of aortic root (71m) and ascending aorta (380m.  - Started on IV Cardizem yesterday but this has been discontinued this morning. - Continue Toprol-XL 7557maily. - CHA2DS2-VASc = 2 (HTN and age). Restart home Xarelto when OK with GI.  - Patient is asymptomatic. Would favor rate control strategy for now. Can follow-up with patient in office and if still in atrial fibrillation after 3 weeks of uninterrupted anticoagulation, could consider outpatient DCCV. - Of note, patient had multiple pauses noted on telemetry (longest being 2.1 seconds). Apneic episodes also noted on telemetry. Patient is unsure if he  snores and denies any daytime somnolence but suspect patient may have sleep apnea. Consider outpatient sleep study.  CHA2DS2-VASc Score = 2  This indicates a 2.2% annual risk of stroke. The patient's score is based upon: CHF History: No HTN History: Yes Diabetes History: No Stroke History: No Vascular Disease History: No Age Score: 1 Gender Score: 0  CKD Stage III - Creatine 1.45 on admission and 1.54 today. Last known baseline from 2018 was 1.4. - Continue to monitor cloesly.  Otherwise, per primary team:  - Sepsis secondary to cholangitis: s/p ERCP with  biliary sphincterotomy and lithotripsy on 12/16  - Hepatitis C  - Chronic back pain    For questions or updates, please contact [place] Please consult [email or web address] for contact info under    Signed, Darreld Mclean, PA-C  07/26/2020 10:28 AM

## 2020-07-28 LAB — CULTURE, BLOOD (ROUTINE X 2)
Culture: NO GROWTH
Culture: NO GROWTH
Special Requests: ADEQUATE
Special Requests: ADEQUATE

## 2020-08-22 ENCOUNTER — Telehealth: Payer: Medicare Other | Admitting: Internal Medicine

## 2020-09-17 ENCOUNTER — Telehealth: Payer: Medicare Other | Admitting: Internal Medicine

## 2020-10-02 ENCOUNTER — Encounter: Payer: Self-pay | Admitting: Internal Medicine

## 2021-04-02 ENCOUNTER — Other Ambulatory Visit: Payer: Self-pay | Admitting: Internal Medicine

## 2021-04-02 DIAGNOSIS — Z0000 Encounter for general adult medical examination without abnormal findings: Secondary | ICD-10-CM

## 2021-08-08 DIAGNOSIS — I700 Atherosclerosis of aorta: Secondary | ICD-10-CM | POA: Diagnosis not present

## 2021-08-08 DIAGNOSIS — E7849 Other hyperlipidemia: Secondary | ICD-10-CM | POA: Diagnosis not present

## 2021-08-08 DIAGNOSIS — I480 Paroxysmal atrial fibrillation: Secondary | ICD-10-CM | POA: Diagnosis not present

## 2021-10-08 DIAGNOSIS — J449 Chronic obstructive pulmonary disease, unspecified: Secondary | ICD-10-CM | POA: Diagnosis not present

## 2021-10-08 DIAGNOSIS — I480 Paroxysmal atrial fibrillation: Secondary | ICD-10-CM | POA: Diagnosis not present

## 2021-10-08 DIAGNOSIS — R7989 Other specified abnormal findings of blood chemistry: Secondary | ICD-10-CM | POA: Diagnosis not present

## 2021-10-08 DIAGNOSIS — K769 Liver disease, unspecified: Secondary | ICD-10-CM | POA: Diagnosis not present

## 2021-10-08 DIAGNOSIS — R112 Nausea with vomiting, unspecified: Secondary | ICD-10-CM | POA: Diagnosis not present

## 2021-10-08 DIAGNOSIS — I700 Atherosclerosis of aorta: Secondary | ICD-10-CM | POA: Diagnosis not present

## 2021-10-08 DIAGNOSIS — D692 Other nonthrombocytopenic purpura: Secondary | ICD-10-CM | POA: Diagnosis not present

## 2021-10-08 DIAGNOSIS — Z7289 Other problems related to lifestyle: Secondary | ICD-10-CM | POA: Diagnosis not present

## 2021-10-08 DIAGNOSIS — Z1339 Encounter for screening examination for other mental health and behavioral disorders: Secondary | ICD-10-CM | POA: Diagnosis not present

## 2021-10-08 DIAGNOSIS — M459 Ankylosing spondylitis of unspecified sites in spine: Secondary | ICD-10-CM | POA: Diagnosis not present

## 2021-10-08 DIAGNOSIS — I10 Essential (primary) hypertension: Secondary | ICD-10-CM | POA: Diagnosis not present

## 2021-10-08 DIAGNOSIS — Z1331 Encounter for screening for depression: Secondary | ICD-10-CM | POA: Diagnosis not present

## 2021-10-15 DIAGNOSIS — G894 Chronic pain syndrome: Secondary | ICD-10-CM | POA: Diagnosis not present

## 2021-10-15 DIAGNOSIS — Z5181 Encounter for therapeutic drug level monitoring: Secondary | ICD-10-CM | POA: Diagnosis not present

## 2021-10-15 DIAGNOSIS — Z79899 Other long term (current) drug therapy: Secondary | ICD-10-CM | POA: Diagnosis not present

## 2021-10-15 DIAGNOSIS — Z79891 Long term (current) use of opiate analgesic: Secondary | ICD-10-CM | POA: Diagnosis not present

## 2021-12-17 ENCOUNTER — Encounter (HOSPITAL_BASED_OUTPATIENT_CLINIC_OR_DEPARTMENT_OTHER): Payer: Self-pay | Admitting: Internal Medicine

## 2021-12-17 ENCOUNTER — Ambulatory Visit (INDEPENDENT_AMBULATORY_CARE_PROVIDER_SITE_OTHER): Payer: Medicare Other | Admitting: Internal Medicine

## 2021-12-17 VITALS — BP 142/80 | HR 107 | Ht 70.0 in | Wt 194.1 lb

## 2021-12-17 DIAGNOSIS — I4891 Unspecified atrial fibrillation: Secondary | ICD-10-CM | POA: Diagnosis not present

## 2021-12-17 DIAGNOSIS — Z7901 Long term (current) use of anticoagulants: Secondary | ICD-10-CM

## 2021-12-17 DIAGNOSIS — I10 Essential (primary) hypertension: Secondary | ICD-10-CM

## 2021-12-17 DIAGNOSIS — I77810 Thoracic aortic ectasia: Secondary | ICD-10-CM | POA: Diagnosis not present

## 2021-12-17 MED ORDER — METOPROLOL SUCCINATE ER 50 MG PO TB24: 50.0000 mg | ORAL_TABLET | Freq: Every day | ORAL | 3 refills | Status: DC

## 2021-12-17 NOTE — Progress Notes (Signed)
? ? ?OFFICE CONSULT NOTE ? ?Chief Complaint:  ?Hospital follow-up ? ?Primary Care Physician: ?Justin Margarita, DO ? ?HPI:  ?Justin [name] is a 71 y.o. male who is being seen today for the evaluation of A-fib at the request of Justin Margarita, DO.  Justin [name] is a pleasant 71 year old male I initially saw in the hospital in December 2021, at which time we were consulted for A-fib with RVR.  He has a history of PAF on Xarelto and then presented in A-fib in the setting of septic cholangitis.  He apparently also had kidney stones.  Either way we added diltiazem to his metoprolol for better rate control.  Plan was to consider outpatient cardioversion if he was persistently in A-fib after he recovered.  Unfortunately did not return for any follow-up and only recently was seen by his PCP again for virtual visit.  He decided to pursue follow-up with me based on discharge paperwork and is seen in the office today.  He reports has been compliant with the Xarelto 20 mg daily but has had issues with diffuse bruising.  He does not take aspirin.  There are number of other medications he is not currently taking including losartan and diltiazem 120 mg CD that we started in the hospital.  He does take metoprolol succinate but currently only 25 mg daily rather than a previous dose of 75 mg daily.  He said this is partially due to the fact that he lost a lot of weight.  He is on longstanding OxyContin.  EKG today shows he is in A-fib with RVR heart rate 107.  He has an underlying right bundle branch block.  He does report fatigue and some shortness of breath which could be attributable to his A-fib.  Echo, last in 2021 showed EF 50 to 55%.  He did have moderate left atrial enlargement and mild right atrial enlargement. ? ?PMHx:  ?Past Medical History:  ?Diagnosis Date  ? Angioedema 03/28/2012  ? "tongue and throat"- was never known why"was taking humira at the time.  ? Arthritis   ? "hands""back"  ? Asthma   ? treated for over 37yrs  ago  ? Chronic back pain   ? scoliosis/spondylosis  ? Complication of anesthesia   ? may try fight when waking up  ? History of bronchitis 10+yrs ago  ? History of colon polyps   ? History of gout   ? last time many yrs ago  ? History of kidney stones   ? Joint pain   ? Joint swelling   ? Lyme disease   ? just completed treatment with Doxycycline   ? Pneumonia 08/2013  ? 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 Weakness   ? and numbness in right leg  ? ? ?Past Surgical History:  ?Procedure Laterality Date  ? APPENDECTOMY    ? BILIARY DILATION  07/25/2020  ? Procedure: BILIARY DILATION;  Surgeon: Clarene Essex, MD;  Location: [facility];  Service: Endoscopy;;  ? CHOLECYSTECTOMY    ? COLONOSCOPY    ? ERCP N/A 07/25/2020  ? Procedure: ENDOSCOPIC RETROGRADE CHOLANGIOPANCREATOGRAPHY (ERCP);  Surgeon: Clarene Essex, MD;  Location: [facility];  Service: Endoscopy;  Laterality: N/A;  ? ESOPHAGOGASTRODUODENOSCOPY    ? knuckle surgery    ? right middle finger a couple of times  ? LUMBAR LAMINECTOMY/DECOMPRESSION MICRODISCECTOMY Right 01/11/2013  ? Procedure: CENTRAL DECOMPRESSION LUMBAR LAMINECTOMYL4-L5 RIGHT, MICRODISCECTOMY L4-L5 RIGHT     (1 LEVEL);  Surgeon: Tobi Bastos, MD;  [facility]: [facility];  Service: Orthopedics;  Laterality: Right;  ? REMOVAL OF STONES  07/25/2020  ? Procedure: REMOVAL OF STONES;  Surgeon: Clarene Essex, MD;  [facility];  Service: Endoscopy;;  ? SEPTOPLASTY    ? SHOULDER OPEN ROTATOR CUFF REPAIR Left   ? x 2;impingement syndrome and torn rotator cuff  ? SPHINCTEROTOMY  07/25/2020  ? Procedure: SPHINCTEROTOMY;  Surgeon: Clarene Essex, MD;  Location: [facility];  Service: Endoscopy;;  ? SPINAL CORD STIMULATOR INSERTION N/A 01/04/2014  ? Procedure: LUMBAR SPINAL CORD STIMULATOR INSERTION;  Surgeon: Melina Schools, MD;  Location: [facility];  Service: Orthopedics;  Laterality: N/A;  ? TONSILLECTOMY    ? ? ?FAMHx:  ?Family History  ?Problem Relation Age of Onset  ? Hypertension Other   ? ? ?SOCHx:  ? reports  that he has been smoking cigars. He has never used smokeless tobacco. He reports current alcohol use. He reports that he does not use drugs. ? ?ALLERGIES:  ?Allergies  ?Allergen Reactions  ? Indocin [Indomethacin] Other (See Comments)  ?  Severe headaches.   ? Norco [Hydrocodone-Acetaminophen] Nausea And Vomiting  ? ? ?ROS: ?Pertinent items noted in HPI and remainder of comprehensive ROS otherwise negative. ? ?HOME MEDS: ?Current Outpatient Medications on File Prior to Visit  ?Medication Sig Dispense Refill  ? ADVAIR DISKUS 250-50 MCG/ACT AEPB Inhale 1 puff into the lungs 2 (two) times daily.    ? albuterol (VENTOLIN HFA) 108 (90 Base) MCG/ACT inhaler Inhale 2 puffs into the lungs every 4 (four) hours as needed for wheezing or shortness of breath.    ? omeprazole (PRILOSEC) 40 MG capsule Take 40 mg by mouth every morning.    ? OXYCONTIN 30 MG 12 hr tablet Take 1 tablet by mouth 2 (two) times daily.    ? tiZANidine (ZANAFLEX) 4 MG tablet Take 4 mg by mouth 2 (two) times daily as needed.    ? XARELTO 20 MG TABS tablet Take 20 mg by mouth at bedtime.    ? ?No current facility-administered medications on file prior to visit.  ? ? ?LABS/IMAGING: ?No results found for this or any previous visit (from the past 48 hour(s)). ?No results found. ? ?LIPID PANEL: ?No results found for: CHOL, TRIG, HDL, CHOLHDL, VLDL, LDLCALC, LDLDIRECT ? ?WEIGHTS: ?Wt Readings from Last 3 Encounters:  ?12/17/21 194 lb 1.6 oz (88 kg)  ?07/23/20 199 lb (90.3 kg)  ?01/05/14 192 lb (87.1 kg)  ? ? ?VITALS: ?BP (!) 142/80   Pulse (!) 107   Ht 5\' 10"  (1.778 m)   Wt 194 lb 1.6 oz (88 kg)   SpO2 99%   BMI 27.85 kg/m?  ? ?EXAM: ?General appearance: alert and no distress ?Neck: no carotid bruit, no JVD, and thyroid not enlarged, symmetric, no tenderness/mass/nodules ?Lungs: diminished breath sounds bilaterally ?Heart: irregularly irregular rhythm and tachycardic ?Abdomen: soft, non-tender; bowel sounds normal; no masses,  no organomegaly ?Extremities:  extremities normal, atraumatic, no cyanosis or edema ?Pulses: 2+ and symmetric ?Skin: Skin color, texture, turgor normal. No rashes or lesions ?Neurologic: Grossly normal ?Psych: Pleasant ? ?EKG: ?A-fib with RVR, right bundle branch block at 107- personally reviewed ? ?ASSESSMENT: ?Persistent atrial fibrillation with rapid ventricular response ?CHA2DS2-VASc score of 2 on Xarelto with diffuse bruising ?Fatigue/shortness of breath ?Low normal LVEF 50 to 55%, mildly dilated aortic root at 40 mm (07/2020) ?Hypertension ? ?PLAN: ?1.   Mr. Mckeag was lost to follow-up after seen in the hospital in December 2021.  He now wishes to reestablish and is noted  to be persistently in A-fib.  He is somewhat in rapid ventricular response today.  He had discontinued diltiazem which was added in the hospital to his metoprolol and his metoprolol dose is significantly decreased.  I advised him to increase Toprol-XL from 25 to 50 mg daily.  Fortunately, he has not missed any doses of Xarelto.  He would like to trial a cardioversion attempt.  I think this is reasonable.  Cost of the Xarelto has been very expensive for him therefore he would like samples today which we will provide.  We will then evaluate whether he can get patient assistance or perhaps switch to Eliquis or consider other options such as a clinical trial for a novel oral anticoagulant which is supposedly associated with lower bleeding risk or perhaps referral for watchman left atrial appendage occlusion.  Either way, he will need to complete a least a month of anticoagulation before we could consider these options after cardioversion.  We will try to arrange for the earliest possible cardioversion procedure.  I have no availabilities until later in June.  We will also arrange for repeat echocardiogram to assess his LV function and aortic root measurements after his cardioversion.  Follow-up with me at that time. ? ?Pixie Casino, MD, [facility], FACP  ?[place]  ?Medical Director of the Advanced Lipid Disorders &  ?Cardiovascular Risk Reduction Clinic ?Diplomate of the [employer] of Clinical Lipidology ?Attending Cardiologist  ?Direct Dial: [phone]

## 2021-12-17 NOTE — Patient Instructions (Signed)
Medication Instructions:  ?INCREASE metoprolol succinate to 50mg  daily ? ?*If you need a refill on your cardiac medications before your next appointment, please call your pharmacy* ? ? ?Lab Work: ?CBC, BMET today  ? ?If you have labs (blood work) drawn today and your tests are completely normal, you will receive your results only by: ?MyChart Message (if you have MyChart) OR ?A paper copy in the mail ?If you have any lab test that is abnormal or we need to change your treatment, we will call you to review the results. ? ? ?Testing/Procedures: ?Cardioversion at [facility] on 12/24/21 with Dr. 12/26/21 ? ? ?Follow-Up: ?At [facility], you and your health needs are our priority.  As part of our continuing mission to provide you with exceptional heart care, we have created designated Provider Care Teams.  These Care Teams include your primary Cardiologist (physician) and Advanced Practice Providers (APPs -  Physician Assistants and Nurse Practitioners) who all work together to provide you with the care you need, when you need it. ? ?We recommend signing up for the patient portal called "MyChart".  Sign up information is provided on this After Visit Summary.  MyChart is used to connect with patients for Virtual Visits (Telemedicine).  Patients are able to view lab/test results, encounter notes, upcoming appointments, etc.  Non-urgent messages can be sent to your provider as well.   ?To learn more about what you can do with MyChart, go to [facility].   ? ?Your next appointment:   ?3-4 weeks after cardioversion with Dr. [email or web address] or PA/NP ? ?Other Instructions ? ?You are scheduled for a Cardioversion on 12/24/21 with Dr. 12/26/21.  Please arrive at the [facility] (Main Entrance A) at [facility]: [address] [place], [place] at 10 am ?DIET: Nothing to eat or drink after midnight except a sip of water with medications (see medication instructions below) ? ?FYI: For your safety, and to  allow [id number] to monitor your vital signs accurately during the surgery/procedure we request that   ?if you have artificial nails, gel coating, SNS etc. Please have those removed prior to your surgery/procedure. Not having the nail coverings /polish removed may result in cancellation or delay of your surgery/procedure. ? ? ?Medication Instructions: ?Hold - N/A ? ?Continue your anticoagulant: Xarelto ?You will need to continue your anticoagulant after your procedure  ?until you are told by your provider that it is safe to stop ? ? ?Labs: BMET, CBC today 5/10 ? ?You must have a responsible person to drive you home and stay in the waiting area during your procedure. Failure to do so could result in cancellation. ? ?[place] cards. ? ?*Special Note: Every effort is made to have your procedure done on time. Occasionally there are emergencies that occur at the hospital that may cause delays. Please be patient if a delay does occur.  ? ?

## 2021-12-17 NOTE — H&P (View-Only) (Signed)
? ? ?OFFICE CONSULT NOTE ? ?Chief Complaint:  ?Hospital follow-up ? ?Primary Care Physician: ?Sueanne Margarita, DO ? ?HPI:  ?Justin [name] is a 71 y.o. male who is being seen today for the evaluation of A-fib at the request of Sueanne Margarita, DO.  Justin [name] is a pleasant 71 year old male I initially saw in the hospital in December 2021, at which time we were consulted for A-fib with RVR.  He has a history of PAF on Xarelto and then presented in A-fib in the setting of septic cholangitis.  He apparently also had kidney stones.  Either way we added diltiazem to his metoprolol for better rate control.  Plan was to consider outpatient cardioversion if he was persistently in A-fib after he recovered.  Unfortunately did not return for any follow-up and only recently was seen by his PCP again for virtual visit.  He decided to pursue follow-up with me based on discharge paperwork and is seen in the office today.  He reports has been compliant with the Xarelto 20 mg daily but has had issues with diffuse bruising.  He does not take aspirin.  There are number of other medications he is not currently taking including losartan and diltiazem 120 mg CD that we started in the hospital.  He does take metoprolol succinate but currently only 25 mg daily rather than a previous dose of 75 mg daily.  He said this is partially due to the fact that he lost a lot of weight.  He is on longstanding OxyContin.  EKG today shows he is in A-fib with RVR heart rate 107.  He has an underlying right bundle branch block.  He does report fatigue and some shortness of breath which could be attributable to his A-fib.  Echo, last in 2021 showed EF 50 to 55%.  He did have moderate left atrial enlargement and mild right atrial enlargement. ? ?PMHx:  ?Past Medical History:  ?Diagnosis Date  ? Angioedema 03/28/2012  ? "tongue and throat"- was never known why"was taking humira at the time.  ? Arthritis   ? "hands""back"  ? Asthma   ? treated for over 51yrs  ago  ? Chronic back pain   ? scoliosis/spondylosis  ? Complication of anesthesia   ? may try fight when waking up  ? History of bronchitis 10+yrs ago  ? History of colon polyps   ? History of gout   ? last time many yrs ago  ? History of kidney stones   ? Joint pain   ? Joint swelling   ? Lyme disease   ? just completed treatment with Doxycycline   ? Pneumonia 08/2013  ? 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 Weakness   ? and numbness in right leg  ? ? ?Past Surgical History:  ?Procedure Laterality Date  ? APPENDECTOMY    ? BILIARY DILATION  07/25/2020  ? Procedure: BILIARY DILATION;  Surgeon: Clarene Essex, MD;  Location: [facility];  Service: Endoscopy;;  ? CHOLECYSTECTOMY    ? COLONOSCOPY    ? ERCP N/A 07/25/2020  ? Procedure: ENDOSCOPIC RETROGRADE CHOLANGIOPANCREATOGRAPHY (ERCP);  Surgeon: Clarene Essex, MD;  Location: [facility];  Service: Endoscopy;  Laterality: N/A;  ? ESOPHAGOGASTRODUODENOSCOPY    ? knuckle surgery    ? right middle finger a couple of times  ? LUMBAR LAMINECTOMY/DECOMPRESSION MICRODISCECTOMY Right 01/11/2013  ? Procedure: CENTRAL DECOMPRESSION LUMBAR LAMINECTOMYL4-L5 RIGHT, MICRODISCECTOMY L4-L5 RIGHT     (1 LEVEL);  Surgeon: Tobi Bastos, MD;  [facility]: [facility];  Service: Orthopedics;  Laterality: Right;  ? REMOVAL OF STONES  07/25/2020  ? Procedure: REMOVAL OF STONES;  Surgeon: Clarene Essex, MD;  [facility]: [facility];  Service: Endoscopy;;  ? SEPTOPLASTY    ? SHOULDER OPEN ROTATOR CUFF REPAIR Left   ? x 2;impingement syndrome and torn rotator cuff  ? SPHINCTEROTOMY  07/25/2020  ? Procedure: SPHINCTEROTOMY;  Surgeon: Clarene Essex, MD;  Location: [facility];  Service: Endoscopy;;  ? SPINAL CORD STIMULATOR INSERTION N/A 01/04/2014  ? Procedure: LUMBAR SPINAL CORD STIMULATOR INSERTION;  Surgeon: Melina Schools, MD;  Location: [place];  Service: Orthopedics;  Laterality: N/A;  ? TONSILLECTOMY    ? ? ?FAMHx:  ?Family History  ?Problem Relation Age of Onset  ? Hypertension Other   ? ? ?SOCHx:  ? reports  that he has been smoking cigars. He has never used smokeless tobacco. He reports current alcohol use. He reports that he does not use drugs. ? ?ALLERGIES:  ?Allergies  ?Allergen Reactions  ? Indocin [Indomethacin] Other (See Comments)  ?  Severe headaches.   ? Norco [Hydrocodone-Acetaminophen] Nausea And Vomiting  ? ? ?ROS: ?Pertinent items noted in HPI and remainder of comprehensive ROS otherwise negative. ? ?HOME MEDS: ?Current Outpatient Medications on File Prior to Visit  ?Medication Sig Dispense Refill  ? ADVAIR DISKUS 250-50 MCG/ACT AEPB Inhale 1 puff into the lungs 2 (two) times daily.    ? albuterol (VENTOLIN HFA) 108 (90 Base) MCG/ACT inhaler Inhale 2 puffs into the lungs every 4 (four) hours as needed for wheezing or shortness of breath.    ? omeprazole (PRILOSEC) 40 MG capsule Take 40 mg by mouth every morning.    ? OXYCONTIN 30 MG 12 hr tablet Take 1 tablet by mouth 2 (two) times daily.    ? tiZANidine (ZANAFLEX) 4 MG tablet Take 4 mg by mouth 2 (two) times daily as needed.    ? XARELTO 20 MG TABS tablet Take 20 mg by mouth at bedtime.    ? ?No current facility-administered medications on file prior to visit.  ? ? ?LABS/IMAGING: ?No results found for this or any previous visit (from the past 48 hour(s)). ?No results found. ? ?LIPID PANEL: ?No results found for: CHOL, TRIG, HDL, CHOLHDL, VLDL, LDLCALC, LDLDIRECT ? ?WEIGHTS: ?Wt Readings from Last 3 Encounters:  ?12/17/21 194 lb 1.6 oz (88 kg)  ?07/23/20 199 lb (90.3 kg)  ?01/05/14 192 lb (87.1 kg)  ? ? ?VITALS: ?BP (!) 142/80   Pulse (!) 107   Ht 5\' 10"  (1.778 m)   Wt 194 lb 1.6 oz (88 kg)   SpO2 99%   BMI 27.85 kg/m?  ? ?EXAM: ?General appearance: alert and no distress ?Neck: no carotid bruit, no JVD, and thyroid not enlarged, symmetric, no tenderness/mass/nodules ?Lungs: diminished breath sounds bilaterally ?Heart: irregularly irregular rhythm and tachycardic ?Abdomen: soft, non-tender; bowel sounds normal; no masses,  no organomegaly ?Extremities:  extremities normal, atraumatic, no cyanosis or edema ?Pulses: 2+ and symmetric ?Skin: Skin color, texture, turgor normal. No rashes or lesions ?Neurologic: Grossly normal ?Psych: Pleasant ? ?EKG: ?A-fib with RVR, right bundle branch block at 107- personally reviewed ? ?ASSESSMENT: ?Persistent atrial fibrillation with rapid ventricular response ?CHA2DS2-VASc score of 2 on Xarelto with diffuse bruising ?Fatigue/shortness of breath ?Low normal LVEF 50 to 55%, mildly dilated aortic root at 40 mm (07/2020) ?Hypertension ? ?PLAN: ?1.   Justin [name] was lost to follow-up after seen in the hospital in December 2021.  He now wishes to reestablish and is noted  to be persistently in A-fib.  He is somewhat in rapid ventricular response today.  He had discontinued diltiazem which was added in the hospital to his metoprolol and his metoprolol dose is significantly decreased.  I advised him to increase Toprol-XL from 25 to 50 mg daily.  Fortunately, he has not missed any doses of Xarelto.  He would like to trial a cardioversion attempt.  I think this is reasonable.  Cost of the Xarelto has been very expensive for him therefore he would like samples today which we will provide.  We will then evaluate whether he can get patient assistance or perhaps switch to Eliquis or consider other options such as a clinical trial for a novel oral anticoagulant which is supposedly associated with lower bleeding risk or perhaps referral for watchman left atrial appendage occlusion.  Either way, he will need to complete a least a month of anticoagulation before we could consider these options after cardioversion.  We will try to arrange for the earliest possible cardioversion procedure.  I have no availabilities until later in June.  We will also arrange for repeat echocardiogram to assess his LV function and aortic root measurements after his cardioversion.  Follow-up with me at that time. ? ?Pixie Casino, MD, [facility], FACP  ?[place]  ?Medical Director of the Advanced Lipid Disorders &  ?Cardiovascular Risk Reduction Clinic ?Diplomate of the [employer] of Clinical Lipidology ?Attending Cardiologist  ?Direct Dial: [phone]

## 2021-12-18 LAB — BASIC METABOLIC PANEL
BUN/Creatinine Ratio: 13 (ref 10–24)
BUN: 17 mg/dL (ref 8–27)
CO2: 24 mmol/L (ref 20–29)
Calcium: 10.5 mg/dL — ABNORMAL HIGH (ref 8.6–10.2)
Chloride: 99 mmol/L (ref 96–106)
Creatinine, Ser: 1.3 mg/dL — ABNORMAL HIGH (ref 0.76–1.27)
Glucose: 110 mg/dL — ABNORMAL HIGH (ref 70–99)
Potassium: 4.3 mmol/L (ref 3.5–5.2)
Sodium: 140 mmol/L (ref 134–144)
eGFR: 59 mL/min/{1.73_m2} — ABNORMAL LOW (ref >59)

## 2021-12-18 LAB — CBC
Hematocrit: 43.1 % (ref 37.5–51.0)
Hemoglobin: 14.7 g/dL (ref 13.0–17.7)
MCH: 33.9 pg — ABNORMAL HIGH (ref 26.6–33.0)
MCHC: 34.1 g/dL (ref 31.5–35.7)
MCV: 99 fL — ABNORMAL HIGH (ref 79–97)
Platelets: 221 10*3/uL (ref 150–450)
RBC: 4.34 x10E6/uL (ref 4.14–5.80)
RDW: 11.8 % (ref 11.6–15.4)
WBC: 6.7 10*3/uL (ref 3.4–10.8)

## 2021-12-22 ENCOUNTER — Other Ambulatory Visit: Payer: Self-pay | Admitting: Cardiovascular Disease

## 2021-12-22 DIAGNOSIS — I4891 Unspecified atrial fibrillation: Secondary | ICD-10-CM

## 2021-12-24 ENCOUNTER — Ambulatory Visit (HOSPITAL_COMMUNITY): Payer: Medicare Other | Admitting: Certified Registered"

## 2021-12-24 ENCOUNTER — Encounter (HOSPITAL_COMMUNITY): Payer: Self-pay | Admitting: Cardiovascular Disease

## 2021-12-24 ENCOUNTER — Other Ambulatory Visit: Payer: Self-pay

## 2021-12-24 ENCOUNTER — Ambulatory Visit (HOSPITAL_COMMUNITY)
Admission: RE | Admit: 2021-12-24 | Discharge: 2021-12-24 | Disposition: A | Discharge status: Home / Self Care | Payer: Medicare Other | Attending: Cardiovascular Disease | Admitting: Cardiovascular Disease

## 2021-12-24 ENCOUNTER — Ambulatory Visit (HOSPITAL_BASED_OUTPATIENT_CLINIC_OR_DEPARTMENT_OTHER): Payer: Medicare Other | Admitting: Certified Registered"

## 2021-12-24 ENCOUNTER — Encounter (HOSPITAL_COMMUNITY)
Admission: RE | Disposition: A | Discharge status: Home / Self Care | Payer: Self-pay | Source: Home / Self Care | Attending: Cardiovascular Disease

## 2021-12-24 DIAGNOSIS — I4819 Other persistent atrial fibrillation: Secondary | ICD-10-CM | POA: Insufficient documentation

## 2021-12-24 DIAGNOSIS — I4891 Unspecified atrial fibrillation: Secondary | ICD-10-CM

## 2021-12-24 DIAGNOSIS — Z7901 Long term (current) use of anticoagulants: Secondary | ICD-10-CM | POA: Insufficient documentation

## 2021-12-24 DIAGNOSIS — I4510 Unspecified right bundle-branch block: Secondary | ICD-10-CM | POA: Diagnosis not present

## 2021-12-24 DIAGNOSIS — J45909 Unspecified asthma, uncomplicated: Secondary | ICD-10-CM | POA: Insufficient documentation

## 2021-12-24 DIAGNOSIS — M1990 Unspecified osteoarthritis, unspecified site: Secondary | ICD-10-CM | POA: Diagnosis not present

## 2021-12-24 DIAGNOSIS — K759 Inflammatory liver disease, unspecified: Secondary | ICD-10-CM | POA: Diagnosis not present

## 2021-12-24 DIAGNOSIS — Z87891 Personal history of nicotine dependence: Secondary | ICD-10-CM | POA: Diagnosis not present

## 2021-12-24 DIAGNOSIS — N289 Disorder of kidney and ureter, unspecified: Secondary | ICD-10-CM | POA: Diagnosis not present

## 2021-12-24 DIAGNOSIS — Z79899 Other long term (current) drug therapy: Secondary | ICD-10-CM | POA: Insufficient documentation

## 2021-12-24 HISTORY — PX: CARDIOVERSION: SHX1299

## 2021-12-24 SURGERY — CARDIOVERSION
Anesthesia: General

## 2021-12-24 MED ORDER — PROPOFOL 10 MG/ML IV BOLUS
INTRAVENOUS | Status: DC | PRN
  Administered 2021-12-24: 40 mg via INTRAVENOUS
  Administered 2021-12-24: 50 mg via INTRAVENOUS
  Administered 2021-12-24: 60 mg via INTRAVENOUS

## 2021-12-24 MED ORDER — SODIUM CHLORIDE 0.9 % IV SOLN: INTRAVENOUS | Status: DC

## 2021-12-24 MED ORDER — LIDOCAINE 2% (20 MG/ML) 5 ML SYRINGE
INTRAMUSCULAR | Status: DC | PRN
  Administered 2021-12-24: 40 mg via INTRAVENOUS

## 2021-12-24 NOTE — Anesthesia Postprocedure Evaluation (Signed)
Anesthesia Post Note ? ?Patient: Justin [name] ? ?Procedure(s) Performed: CARDIOVERSION ? ?  ? ?Patient location during evaluation: PACU ?Anesthesia Type: General ?Level of consciousness: awake and alert ?Pain management: pain level controlled ?Vital Signs Assessment: post-procedure vital signs reviewed and stable ?Respiratory status: spontaneous breathing, nonlabored ventilation, respiratory function stable and patient connected to nasal cannula oxygen ?Cardiovascular status: blood pressure returned to baseline and stable ?Postop Assessment: no apparent nausea or vomiting ?Anesthetic complications: no ? ? ?No notable events documented. ? ?Last Vitals:  ?Vitals:  ? 12/24/21 1122 12/24/21 1126  ?BP:  (!) 170/107  ?Pulse: (!) 59 96  ?Resp: 17 19  ?Temp:    ?SpO2: 92% 93%  ?  ?Last Pain:  ?Vitals:  ? 12/24/21 1100  ?TempSrc: Temporal  ?PainSc: 0-No pain  ? ? ?  ?  ?  ?  ?  ?  ? ?Effie Berkshire ? ? ? ? ?

## 2021-12-24 NOTE — Anesthesia Procedure Notes (Addendum)
Procedure Name: General with mask airway ?Date/Time: 12/24/2021 10:40 AM ?Performed by: Colon Flattery, CRNA ?Pre-anesthesia Checklist: Patient identified, Emergency Drugs available, Suction available and Patient being monitored ?Patient Re-evaluated:Patient Re-evaluated prior to induction ?Oxygen Delivery Method: Ambu bag ?Preoxygenation: Pre-oxygenation with 100% oxygen ?Induction Type: IV induction ?Placement Confirmation: positive ETCO2 ?Dental Injury: Teeth and Oropharynx as per pre-operative assessment  ? ? ? ? ?

## 2021-12-24 NOTE — Discharge Instructions (Signed)

## 2021-12-24 NOTE — Transfer of Care (Signed)
Immediate Anesthesia Transfer of Care Note ? ?Patient: Justin [name] ? ?Procedure(s) Performed: CARDIOVERSION ? ?Patient Location: Endoscopy Unit ? ?Anesthesia Type:General ? ?Level of Consciousness: awake, alert  and oriented ? ?Airway & Oxygen Therapy: Patient Spontanous Breathing ? ?Post-op Assessment: Report given to RN, Post -op Vital signs reviewed and stable and Patient moving all extremities ? ?Post vital signs: Reviewed and stable ? ?Last Vitals:  ?Vitals Value Taken Time  ?BP    ?Temp    ?Pulse    ?Resp    ?SpO2    ? ? ?Last Pain:  ?Vitals:  ? 12/24/21 1012  ?TempSrc: Temporal  ?PainSc: 0-No pain  ?   ? ?  ? ?Complications: No notable events documented. ?

## 2021-12-24 NOTE — Interval H&P Note (Signed)
History and Physical Interval Note: ? ?12/24/2021 ?10:37 AM ? ?Dennies Coate Eunice  has presented today for surgery, with the diagnosis of ATRIAL FIB.  The various methods of treatment have been discussed with the patient and family. After consideration of risks, benefits and other options for treatment, the patient has consented to  Procedure(s): ?CARDIOVERSION (N/A) as a surgical intervention.  The patient's history has been reviewed, patient examined, no change in status, stable for surgery.  I have reviewed the patient's chart and labs.  Questions were answered to the patient's satisfaction.   ? ?NPO for DCCV. On xarelto. >3 weeks. No missed doses.  ? ?Gerri Spore T. Flora Lipps, MD, [facility] ?[place]  [facility]  ?[address], [address] ?[place], [place] [address] ?[phone]  ?10:37 AM ? ? ? ?

## 2021-12-24 NOTE — Anesthesia Preprocedure Evaluation (Signed)
Anesthesia Evaluation  ?Patient identified by MRN, date of birth, ID band ?Patient awake ? ? ? ?Reviewed: ?Allergy & Precautions, NPO status , Patient's Chart, lab work & pertinent test results ? ?Airway ?Mallampati: II ? ?TM Distance: >3 FB ?Neck ROM: Full ? ? ? Dental ? ?(+) Teeth Intact, Dental Advisory Given ?  ?Pulmonary ?asthma , Current Smoker,  ?  ?breath sounds clear to auscultation ? ? ? ? ? ? Cardiovascular ? ?Rhythm:Irregular Rate:Abnormal ? ?Echo: ?1. Left ventricular ejection fraction, by estimation, is 50 to 55%. The  ?left ventricle has low normal function. The left ventricle has no regional  ?wall motion abnormalities. There is moderate left ventricular hypertrophy.  ?Left ventricular diastolic  ?function could not be evaluated.  ??2. Right ventricular systolic function is normal. The right ventricular  ?size is normal.  ??3. Left atrial size was moderately dilated.  ??4. Right atrial size was mildly dilated.  ??5. The mitral valve is normal in structure. Trivial mitral valve  ?regurgitation. No evidence of mitral stenosis.  ??6. The aortic valve is tricuspid. Aortic valve regurgitation is not  ?visualized. Mild aortic valve sclerosis is present, with no evidence of  ?aortic valve stenosis.  ??7. Aortic dilatation noted. There is mild dilatation of the aortic root,  ?measuring 40 mm. There is mild dilatation of the ascending aorta,  ?measuring 39 mm.  ??8. The inferior vena cava is dilated in size with >50% respiratory  ?variability, suggesting right atrial pressure of 8 mmHg.  ?  ?Neuro/Psych ?negative neurological ROS ? negative psych ROS  ? GI/Hepatic ?negative GI ROS, (+) Hepatitis -  ?Endo/Other  ?negative endocrine ROS ? Renal/GU ?Renal disease  ? ?  ?Musculoskeletal ? ?(+) Arthritis ,  ? Abdominal ?Normal abdominal exam  (+)   ?Peds ? Hematology ?negative hematology ROS ?(+)   ?Anesthesia Other Findings ? ? Reproductive/Obstetrics ? ?   ? ? ? ? ? ? ? ? ? ? ? ? ? ?  ?  ? ? ? ? ? ? ? ? ?Anesthesia Physical ?Anesthesia Plan ? ?ASA: 3 ? ?Anesthesia Plan: General  ? ?Post-op Pain Management:   ? ?Induction: Intravenous ? ?PONV Risk Score and Plan: 0 ? ?Airway Management Planned: Natural Airway and Simple Face Mask ? ?Additional Equipment: None ? ?Intra-op Plan:  ? ?Post-operative Plan:  ? ?Informed Consent: I have reviewed the patients History and Physical, chart, labs and discussed the procedure including the risks, benefits and alternatives for the proposed anesthesia with the patient or authorized representative who has indicated his/her understanding and acceptance.  ? ? ? ? ? ?Plan Discussed with: CRNA ? ?Anesthesia Plan Comments:   ? ? ? ? ? ? ?Anesthesia Quick Evaluation ? ?

## 2021-12-24 NOTE — CV Procedure (Signed)
? ?  DIRECT CURRENT CARDIOVERSION ? ?NAME:  Justin [name]    ?MRN: [id number] ?DOB:  1951/05/01    ?ADMIT DATE: 12/24/2021 ? ?Indication:  Symptomatic atrial fibrillation ? ?Procedure Note:  The patient signed informed consent.  They have had had therapeutic anticoagulation with xarelto greater than 3 weeks.  Anesthesia was administered by Dr. Hart Rochester.  Adequate airway was maintained throughout and vital followed per protocol.  They were cardioverted x 3 with 200J of biphasic synchronized energy.  The third attempt with direct pressure he converted to NSR for 4-5 beats and returned to Afib. There were no apparent complications.  The patient had normal neuro status and respiratory status post procedure with vitals stable as recorded elsewhere.   ? ?Follow up: They will continue on current medical therapy and follow up with cardiology as scheduled. ? ?Gerri Spore T. Flora Lipps, MD, [facility] ?[place]  [facility]  ?[address], [address] ?[place], [place] [address] ?[phone]  ?10:49 AM ? ?

## 2022-01-18 NOTE — Progress Notes (Unsigned)
Cardiology Clinic Note   Patient Name: Justin GrillLarry W [name] Date of Encounter: 01/19/2022  Primary Care Provider:  Charlane [name], [place], DO Primary Cardiologist:  Justin NoseKenneth C Hilty, MD  Patient Profile    Justin FactorLarry W [name] 71 year old male presents to the clinic today for follow-up evaluation of his atrial fibrillation.  Past Medical History    Past Medical History:  Diagnosis Date   Angioedema 03/28/2012   "tongue and throat"- was never known why"was taking humira at the time.   Arthritis    "hands""back"   Asthma    treated for over 8121yrs ago   Chronic back pain    scoliosis/spondylosis   Complication of anesthesia    may try fight when waking up   History of bronchitis 10+yrs ago   History of colon polyps    History of gout    last time many yrs ago   History of kidney stones    Joint pain    Joint swelling    Lyme disease    just completed treatment with Doxycycline    Pneumonia 08/2013   Spondylitis, ankylosing (HCC)    Weakness    and numbness in right leg   Past Surgical History:  Procedure Laterality Date   APPENDECTOMY     BILIARY DILATION  07/25/2020   Procedure: BILIARY DILATION;  Surgeon: Vida RiggerMagod, Marc, MD;  Location: [facility];  Service: Endoscopy;;   CARDIOVERSION N/A 12/24/2021   Procedure: CARDIOVERSION;  Surgeon: Sande Rives'Justin, Pickett Thomas, MD;  [facility];  Service: Cardiovascular;  Laterality: N/A;   CHOLECYSTECTOMY     COLONOSCOPY     ERCP N/A 07/25/2020   Procedure: ENDOSCOPIC RETROGRADE CHOLANGIOPANCREATOGRAPHY (ERCP);  Surgeon: Vida RiggerMagod, Marc, MD;  [facility];  Service: Endoscopy;  Laterality: N/A;   ESOPHAGOGASTRODUODENOSCOPY     knuckle surgery     right middle finger a couple of times   LUMBAR LAMINECTOMY/DECOMPRESSION MICRODISCECTOMY Right 01/11/2013   Procedure: CENTRAL DECOMPRESSION LUMBAR LAMINECTOMYL4-L5 RIGHT, MICRODISCECTOMY L4-L5 RIGHT     (1 LEVEL);  Surgeon: Justin Conesonald A Gioffre, MD;  Location: [facility];  Service: Orthopedics;   Laterality: Right;   REMOVAL OF STONES  07/25/2020   Procedure: REMOVAL OF STONES;  Surgeon: Vida RiggerMagod, Marc, MD;  Location: [facility];  Service: Endoscopy;;   SEPTOPLASTY     SHOULDER OPEN ROTATOR CUFF REPAIR Left    x 2;impingement syndrome and torn rotator cuff   SPHINCTEROTOMY  07/25/2020   Procedure: SPHINCTEROTOMY;  Surgeon: Vida RiggerMagod, Marc, MD;  Location: [facility];  Service: Endoscopy;;   SPINAL CORD STIMULATOR INSERTION N/A 01/04/2014   Procedure: LUMBAR SPINAL CORD STIMULATOR INSERTION;  Surgeon: Justin Lickahari Brooks, MD;  Location: [facility] OR;  Service: Orthopedics;  Laterality: N/A;   TONSILLECTOMY      Allergies  Allergies  Allergen Reactions   Indocin [Indomethacin] Other (See Comments)    Severe headaches.    Norco [Hydrocodone-Acetaminophen] Nausea And Vomiting    History of Present Illness    Justin GrillLarry W Starry has a PMH of atrial fibrillation with RVR, bile duct obstruction, hep C, degenerative disc disease, AKI, chronic pain, and long-term use of opiate analgesics.  He was seen by Dr. Rennis [name] on 12/17/2021.  He reported compliance with his Xarelto.  His EKG showed atrial fibrillation with RVR rate of 107.  He also was noted to have an underlying right bundle branch block.  He reported fatigue and some shortness of breath which was felt to be attributed to his atrial fibrillation.  His echocardiogram 2021 showed an  LVEF of 50-55%.  He was also noted to have moderate left atrial enlargement and mild right atrial enlargement.  His metoprolol was increased to 50 mg daily.  He was given samples of Xarelto.  Patient assistance for Eliquis was initiated to evaluate other options for anticoagulation due to cost.  Echocardiogram for evaluation of his aortic root was also planned.  He underwent DCCV 12/24/2021.  He received 3 shocks at 200 J.  He converted to 4-5 beats of NSR before returning to atrial fibrillation.  He was noted to have normal neuro status and respiratory status post procedure.  He  was continued on his current medical therapy and discharged in stable condition.  He presents to the clinic today for follow-up evaluation and states he feels well.  He reports that he has been in atrial fibrillation for quite some time.  He did manage to come off of anticoagulation.  We reviewed the importance of anticoagulation and rate control.  We used shared decision making to decide on continuing rate control and anticoagulation.  He was offered a visit to EP and wishes to defer at this time.  We reviewed his CHA2DS2-VASc score.  He expressed understanding.  We will plan follow-up for 4 to 6 months.  Initially in the office today his blood pressure was 200s over 70s.  On recheck it was 130s over 80s.  His blood pressure is well controlled at home and he continues to maintain a blood pressure log.  Today denies chest pain, shortness of breath, lower extremity edema, fatigue, palpitations, melena, hematuria, hemoptysis, diaphoresis, weakness, presyncope, syncope, orthopnea, and PND.   Home Medications    Prior to Admission medications   Medication Sig Start Date End Date Taking? Authorizing Provider  ADVAIR DISKUS 250-50 MCG/ACT AEPB Inhale 1 puff into the lungs 2 (two) times daily. 12/04/21   [provider]  albuterol (VENTOLIN HFA) 108 (90 Base) MCG/ACT inhaler Inhale 2 puffs into the lungs every 4 (four) hours as needed for wheezing or shortness of breath. 03/05/20   [provider]  cholecalciferol (VITAMIN D3) 25 MCG (1000 UNIT) tablet Take 1,000 Units by mouth daily.    [provider]  metoprolol succinate (TOPROL-XL) 50 MG 24 hr tablet Take 1 tablet (50 mg total) by mouth daily. Patient taking differently: Take 100 mg by mouth daily. 12/17/21   [name], Lisette Abu, MD  Multiple Vitamins-Minerals (MULTIVITAMIN WITH MINERALS) tablet Take 1 tablet by mouth daily.    [provider]  omeprazole (PRILOSEC) 40 MG capsule Take 40 mg by mouth daily as needed (avid  reflux). 10/08/21   [provider]  OXYCONTIN 30 MG 12 hr tablet Take 30 mg by mouth 2 (two) times daily. 07/11/20   [provider]  rivaroxaban (XARELTO) 20 MG TABS tablet Take 20 mg by mouth at bedtime. 07/03/20   [provider]  tiZANidine (ZANAFLEX) 4 MG tablet Take 4 mg by mouth 2 (two) times daily as needed for muscle spasms. 11/25/21   [provider]    Family History    Family History  Problem Relation Age of Onset   Hypertension Other    He indicated that the status of his other is unknown.  Social History    Social History   Socioeconomic History   Marital status: Married    Spouse name: Not on file   Number of children: Not on file   Years of education: Not on file   Highest education level: Not on file  Occupational History   Not on file  Tobacco Use   Smoking status: Some Days    Types: Cigars   Smokeless tobacco: Never   Tobacco comments:    quit smoking cigarettes 10-49yrs ago  Substance and Sexual Activity   Alcohol use: Yes    Comment: couple of beers a week   Drug use: No   Sexual activity: Yes  Other Topics Concern   Not on file  Social History Narrative   Not on file   Social Determinants of Health   Financial Resource Strain: Not on file  Food Insecurity: Not on file  Transportation Needs: Not on file  Physical Activity: Not on file  Stress: Not on file  Social Connections: Not on file  Intimate Partner Violence: Not on file     Review of Systems    General:  No chills, fever, night sweats or weight changes.  Cardiovascular:  No chest pain, dyspnea on exertion, edema, orthopnea, palpitations, paroxysmal nocturnal dyspnea. Dermatological: No rash, lesions/masses Respiratory: No cough, dyspnea Urologic: No hematuria, dysuria Abdominal:   No nausea, vomiting, diarrhea, bright red blood per rectum, melena, or hematemesis Neurologic:  No visual changes, wkns, changes in mental status. All other systems  reviewed and are otherwise negative except as noted above.  Physical Exam    VS:  BP 138/86   Pulse 93   Ht  (1.778 m)   Wt 195 lb 12.8 oz (88.8 kg)   SpO2 96%   BMI 28.09 kg/m  , BMI Body mass index is 28.09 kg/m. GEN: Well nourished, well developed, in no acute distress. HEENT: normal. Neck: Supple, no JVD, carotid bruits, or masses. Cardiac: RRR, no murmurs, rubs, or gallops. No clubbing, cyanosis, edema.  Radials/DP/PT 2+ and equal bilaterally.  Respiratory:  Respirations regular and unlabored, clear to auscultation bilaterally. GI: Soft, nontender, nondistended, BS + x 4. MS: no deformity or atrophy. Skin: warm and dry, no rash. Neuro:  Strength and sensation are intact. Psych: Normal affect.  Accessory Clinical Findings    Recent Labs: 12/17/2021: BUN 17; Creatinine, Ser 1.30; Hemoglobin 14.7; Platelets 221; Potassium 4.3; Sodium 140   Recent Lipid Panel No results found for: "CHOL", "TRIG", "HDL", "CHOLHDL", "VLDL", "LDLCALC", "LDLDIRECT"  ECG personally reviewed by me today-atrial fibrillation left axis deviation right bundle branch block 93 bpm- No acute changes  Assessment & Plan   1.  Atrial fibrillation-heart rate today 93.  Underwent DCCV with 3 shocks at 200 J 12/24/2021.  He converted to NSR for 4-5 beats and returned to atrial fibrillation.  CHA2DS2-VASc score 2.  Used shared decision making to continue anticoagulation and rate control.  Offered referral to EP, he wishes to defer at this time. Continue Xarelto, metoprolol Avoid triggers caffeine, chocolate, EtOH, dehydration etc.  Aortic root dilation-denies recent episodes of chest or back discomfort. Echocardiogram scheduled  Fatigue, shortness of breath-only notes fatigue and shortness of breath with increased physical activity.  Felt to be related to increased heart rate and atrial fibrillation. Maintain metoprolol Increase physical activity as tolerated  Essential hypertension-BP today 138/88.   Well-controlled at home.  Initially in the clinic today his blood pressure is 200s over 76.  He brings in a log today which shows blood pressures in the 110s-130s over 70s.  He checks his blood pressure daily. Continue metoprolol Heart healthy low-sodium diet-salty 6 given Increase physical activity as tolerated   Disposition: Follow-up with Dr. Rennis Golden in 4 -6 months.  Thomasene Ripple. Dalphine Cowie NP-C  01/19/2022, 4:02 PM [facility] [address] Office [phone] Fax [phone]  Notice: This dictation was prepared with Dragon dictation along with smaller phrase technology. Any transcriptional errors that result from this process are unintentional and may not be corrected upon review.  I spent 14 minutes examining this patient, reviewing medications, and using patient centered shared decision making involving her cardiac care.  Prior to her visit I spent greater than 20 minutes reviewing her past medical history,  medications, and prior cardiac tests.

## 2022-01-19 ENCOUNTER — Encounter: Payer: Self-pay | Admitting: General Practice

## 2022-01-19 ENCOUNTER — Ambulatory Visit (INDEPENDENT_AMBULATORY_CARE_PROVIDER_SITE_OTHER): Payer: Medicare Other | Admitting: General Practice

## 2022-01-19 VITALS — BP 138/86 | HR 93 | Ht 70.0 in | Wt 195.8 lb

## 2022-01-19 DIAGNOSIS — I77810 Thoracic aortic ectasia: Secondary | ICD-10-CM

## 2022-01-19 DIAGNOSIS — I4891 Unspecified atrial fibrillation: Secondary | ICD-10-CM

## 2022-01-19 DIAGNOSIS — I10 Essential (primary) hypertension: Secondary | ICD-10-CM | POA: Diagnosis not present

## 2022-01-19 DIAGNOSIS — R5383 Other fatigue: Secondary | ICD-10-CM | POA: Diagnosis not present

## 2022-01-19 MED ORDER — METOPROLOL SUCCINATE ER 100 MG PO TB24: 100.0000 mg | ORAL_TABLET | Freq: Every day | ORAL | 6 refills | Status: DC

## 2022-01-19 NOTE — Patient Instructions (Signed)
Medication Instructions:  The current medical regimen is effective;  continue present plan and medications as directed. Please refer to the Current Medication list given to you today.   *If you need a refill on your cardiac medications before your next appointment, please call your pharmacy*  Lab Work:   Testing/Procedures:  NONE    NONE If you have labs (blood work) drawn today and your tests are completely normal, you will receive your results only by: MyChart Message (if you have MyChart) OR  A paper copy in the mail If you have any lab test that is abnormal or we need to change your treatment, we will call you to review the results.  Special Instructions PLEASE MAINTAIN PHYSICAL ACTIVITY AS TOLERATED   Please try to avoid these triggers: Do not use any products that have nicotine or tobacco in them. These include cigarettes, e-cigarettes, and chewing tobacco. If you need help quitting, ask your doctor. Eat heart-healthy foods. Talk with your doctor about the right eating plan for you. Exercise regularly as told by your doctor. Stay hydrated Do not drink alcohol, Caffeine or chocolate. Lose weight if you are overweight. Do not use drugs, including cannabis   Follow-Up: Your next appointment:  4-6 month(s) In Person with Chrystie Nose, MD  or Edd Fabian, FNP      At [facility], you and your health needs are our priority.  As part of our continuing mission to provide you with exceptional heart care, we have created designated Provider Care Teams.  These Care Teams include your primary Cardiologist (physician) and Advanced Practice Providers (APPs -  Physician Assistants and Nurse Practitioners) who all work together to provide you with the care you need, when you need it.    Important Information About Sugar

## 2022-02-23 DIAGNOSIS — Z79891 Long term (current) use of opiate analgesic: Secondary | ICD-10-CM | POA: Diagnosis not present

## 2022-02-23 DIAGNOSIS — G894 Chronic pain syndrome: Secondary | ICD-10-CM | POA: Diagnosis not present

## 2022-04-19 NOTE — Progress Notes (Unsigned)
Cardiology Clinic Note   Patient Name: SHELDON LANGSETH Date of Encounter: 04/21/2022  Primary Care Provider:  Sueanne Margarita, DO Primary Cardiologist:  Pixie Casino, MD  Patient Profile    Daymond Ellsworth Miklos 71 year old male presents to the clinic today for follow-up evaluation of his atrial fibrillation.  Past Medical History    Past Medical History:  Diagnosis Date   Angioedema 03/28/2012   "tongue and throat"- was never known why"was taking humira at the time.   Arthritis    "hands""back"   Asthma    treated for over 45yrs ago   Chronic back pain    scoliosis/spondylosis   Complication of anesthesia    may try fight when waking up   History of bronchitis 10+yrs ago   History of colon polyps    History of gout    last time many yrs ago   History of kidney stones    Joint pain    Joint swelling    Lyme disease    just completed treatment with Doxycycline    Pneumonia 08/2013   Spondylitis, ankylosing (HCC)    Weakness    and numbness in right leg   Past Surgical History:  Procedure Laterality Date   APPENDECTOMY     BILIARY DILATION  07/25/2020   Procedure: BILIARY DILATION;  Surgeon: Clarene Essex, MD;  Location: [facility];  Service: Endoscopy;;   CARDIOVERSION N/A 12/24/2021   Procedure: CARDIOVERSION;  Surgeon: Geralynn Rile, MD;  Location: [place];  Service: Cardiovascular;  Laterality: N/A;   CHOLECYSTECTOMY     COLONOSCOPY     ERCP N/A 07/25/2020   Procedure: ENDOSCOPIC RETROGRADE CHOLANGIOPANCREATOGRAPHY (ERCP);  Surgeon: Clarene Essex, MD;  Location: [facility];  Service: Endoscopy;  Laterality: N/A;   ESOPHAGOGASTRODUODENOSCOPY     knuckle surgery     right middle finger a couple of times   LUMBAR LAMINECTOMY/DECOMPRESSION MICRODISCECTOMY Right 01/11/2013   Procedure: CENTRAL DECOMPRESSION LUMBAR LAMINECTOMYL4-L5 RIGHT, MICRODISCECTOMY L4-L5 RIGHT     (1 LEVEL);  Surgeon: Tobi Bastos, MD;  Location: [facility];  Service: Orthopedics;   Laterality: Right;   REMOVAL OF STONES  07/25/2020   Procedure: REMOVAL OF STONES;  Surgeon: Clarene Essex, MD;  Location: [facility];  Service: Endoscopy;;   SEPTOPLASTY     SHOULDER OPEN ROTATOR CUFF REPAIR Left    x 2;impingement syndrome and torn rotator cuff   SPHINCTEROTOMY  07/25/2020   Procedure: SPHINCTEROTOMY;  Surgeon: Clarene Essex, MD;  Location: [facility];  Service: Endoscopy;;   SPINAL CORD STIMULATOR INSERTION N/A 01/04/2014   Procedure: LUMBAR SPINAL CORD STIMULATOR INSERTION;  Surgeon: Melina Schools, MD;  Location: [place];  Service: Orthopedics;  Laterality: N/A;   TONSILLECTOMY      Allergies  Allergies  Allergen Reactions   Indocin [Indomethacin] Other (See Comments)    Severe headaches.    Norco [Hydrocodone-Acetaminophen] Nausea And Vomiting    History of Present Illness    LEOLA DERAGON has a PMH of atrial fibrillation with RVR, bile duct obstruction, hep C, degenerative disc disease, AKI, chronic pain, and long-term use of opiate analgesics.  He was seen by Dr. Debara Pickett on 12/17/2021.  He reported compliance with his Xarelto.  His EKG showed atrial fibrillation with RVR rate of 107.  He also was noted to have an underlying right bundle branch block.  He reported fatigue and some shortness of breath which was felt to be attributed to his atrial fibrillation.  His echocardiogram 2021 showed an  LVEF of 50-55%.  He was also noted to have moderate left atrial enlargement and mild right atrial enlargement.  His metoprolol was increased to 50 mg daily.  He was given samples of Xarelto.  Patient assistance for Eliquis was initiated to evaluate other options for anticoagulation due to cost.  Echocardiogram for evaluation of his aortic root was also planned.  He underwent DCCV 12/24/2021.  He received 3 shocks at 200 J.  He converted to 4-5 beats of NSR before returning to atrial fibrillation.  He was noted to have normal neuro status and respiratory status post procedure.  He  was continued on his current medical therapy and discharged in stable condition.  He presented to the clinic 01/19/2022 for follow-up evaluation and stated he felt well.  He reported that he had been in atrial fibrillation for quite some time.  He did manage to come off of anticoagulation.  We reviewed the importance of anticoagulation and rate control.  We used shared decision making to decide on continuing rate control and anticoagulation.  He was offered a visit to EP and wished to defer at the time.  We reviewed his CHA2DS2-VASc score.  He expressed understanding.  I planned follow-up for 4 to 6 months.  Initially in the office  his blood pressure was 200s over 70s.  On recheck it was 130s over 80s.  His blood pressure was well controlled at home and he continued to maintain a blood pressure log.  He presents to the clinic today for follow-up evaluation states he continues to feel well.  He denies bleeding issues.  He has stable shortness of breath with increased physical activity.  His blood pressure log at home shows blood pressures in the 110s over 70s.  His blood pressure initially today in the office is 162/102 recheck is 142/86.  He has been maintaining his physical activity and heart healthy diet.  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  He climbed telephone poles.  He would like a referral to EP to discuss implant of the Watchman device so that he may come off anticoagulation.  I will repeat his echocardiogram, continue his current medication regimen, and plan follow-up in 9 to 12 months.  Today denies chest pain, shortness of breath, lower extremity edema, fatigue, palpitations, melena, hematuria, hemoptysis, diaphoresis, weakness, presyncope, syncope, orthopnea, and PND.     Home Medications    Prior to Admission medications   Medication Sig Start Date End Date Taking? Authorizing Provider  ADVAIR DISKUS 250-50 MCG/ACT AEPB Inhale 1 puff into the lungs 2 (two) times daily. 12/04/21    [provider]  albuterol (VENTOLIN HFA) 108 (90 Base) MCG/ACT inhaler Inhale 2 puffs into the lungs every 4 (four) hours as needed for wheezing or shortness of breath. 03/05/20   [provider]  cholecalciferol (VITAMIN D3) 25 MCG (1000 UNIT) tablet Take 1,000 Units by mouth daily.    [provider]  metoprolol succinate (TOPROL-XL) 50 MG 24 hr tablet Take 1 tablet (50 mg total) by mouth daily. Patient taking differently: Take 100 mg by mouth daily. 12/17/21   Hilty, Lisette Abu, MD  Multiple Vitamins-Minerals (MULTIVITAMIN WITH MINERALS) tablet Take 1 tablet by mouth daily.    [provider]  omeprazole (PRILOSEC) 40 MG capsule Take 40 mg by mouth daily as needed (avid reflux). 10/08/21   [provider]  OXYCONTIN 30 MG 12 hr tablet Take 30 mg by mouth 2 (two) times daily. 07/11/20   [provider]  rivaroxaban (XARELTO) 20 MG TABS tablet Take 20 mg by mouth at bedtime. 07/03/20   [provider]  tiZANidine (ZANAFLEX) 4 MG tablet Take 4 mg by mouth 2 (two) times daily as needed for muscle spasms. 11/25/21   [provider]    Family History    Family History  Problem Relation Age of Onset   Hypertension Other    He indicated that the status of his other is unknown.  Social History    Social History   Socioeconomic History   Marital status: Married    Spouse name: Not on file   Number of children: Not on file   Years of education: Not on file   Highest education level: Not on file  Occupational History   Not on file  Tobacco Use   Smoking status: Former    Types: Cigars   Smokeless tobacco: Never   Tobacco comments:    quit smoking cigarettes 10-84yrs ago  Substance and Sexual Activity   Alcohol use: Yes    Comment: couple of beers a week   Drug use: No   Sexual activity: Yes  Other Topics Concern   Not on file  Social History Narrative   Not on file   Social Determinants of Health   Financial  Resource Strain: Not on file  Food Insecurity: Not on file  Transportation Needs: Not on file  Physical Activity: Not on file  Stress: Not on file  Social Connections: Not on file  Intimate Partner Violence: Not on file     Review of Systems    General:  No chills, fever, night sweats or weight changes.  Cardiovascular:  No chest pain, dyspnea on exertion, edema, orthopnea, palpitations, paroxysmal nocturnal dyspnea. Dermatological: No rash, lesions/masses Respiratory: No cough, dyspnea Urologic: No hematuria, dysuria Abdominal:   No nausea, vomiting, diarrhea, bright red blood per rectum, melena, or hematemesis Neurologic:  No visual changes, wkns, changes in mental status. All other systems reviewed and are otherwise negative except as noted above.  Physical Exam    VS:  BP (!) 142/86   Pulse 75   Ht 5\' 10"  (1.778 m)   Wt 191 lb 12.8 oz (87 kg)   SpO2 98%   BMI 27.52 kg/m  , BMI Body mass index is 27.52 kg/m. GEN: Well nourished, well developed, in no acute distress. HEENT: normal. Neck: Supple, no JVD, carotid bruits, or masses. Cardiac: Irregularly irregular, no murmurs, rubs, or gallops. No clubbing, cyanosis, edema.  Radials/DP/PT 2+ and equal bilaterally.  Respiratory:  Respirations regular and unlabored, clear to auscultation bilaterally. GI: Soft, nontender, nondistended, BS + x 4. MS: no deformity or atrophy. Skin: warm and dry, no rash. Neuro:  Strength and sensation are intact. Psych: Normal affect.  Accessory Clinical Findings    Recent Labs: 12/17/2021: BUN 17; Creatinine, Ser 1.30; Hemoglobin 14.7; Platelets 221; Potassium 4.3; Sodium 140   Recent Lipid Panel No results found for: "CHOL", "TRIG", "HDL", "CHOLHDL", "VLDL", "LDLCALC", "LDLDIRECT"  ECG personally reviewed by me today- none today.   EKG 01/19/2022 atrial fibrillation left axis deviation right bundle branch block 93 bpm- No acute changes  Echocardiogram 07/25/2020  IMPRESSIONS     1.  Left ventricular ejection fraction, by estimation, is 50 to 55%. The  left ventricle has low normal function. The left ventricle has no regional  wall motion abnormalities. There is moderate left ventricular hypertrophy.  Left ventricular diastolic  function could not be evaluated.   2.  Right ventricular systolic function is normal. The right ventricular  size is normal.   3. Left atrial size was moderately dilated.   4. Right atrial size was mildly dilated.   5. The mitral valve is normal in structure. Trivial mitral valve  regurgitation. No evidence of mitral stenosis.   6. The aortic valve is tricuspid. Aortic valve regurgitation is not  visualized. Mild aortic valve sclerosis is present, with no evidence of  aortic valve stenosis.   7. Aortic dilatation noted. There is mild dilatation of the aortic root,  measuring 40 mm. There is mild dilatation of the ascending aorta,  measuring 39 mm.   8. The inferior vena cava is dilated in size with >50% respiratory  variability, suggesting right atrial pressure of 8 mmHg.  Assessment & Plan   1.  Atrial fibrillation-heart rate today 75.  Denies fatigue, DOE, and activity intolerance.  DCCV with 3 shocks at 200 J 12/24/2021.  He converted to NSR for 4-5 beats and returned to atrial fibrillation.  CHA2DS2-VASc score 2.  We reviewed anticoagulation and rate control.  He is interested in EP referral to discuss Watchman placement.    He reports compliance with Xarelto and denies bleeding issues. Continue Xarelto, metoprolol Avoid triggers caffeine, chocolate, EtOH, dehydration etc.-reviewed Maintain physical activity EP for consideration of watchman  Essential hypertension-BP today 142/86.  Initially in the clinic today his blood pressure is 162/102 and on recheck is 142/86.  Well-controlled at home.  Maintaining blood pressure log. 110/70's at home. Continue metoprolol Heart healthy low-sodium diet-salty 6 reviewed Increase physical activity as  tolerated  Aortic root dilation-denies recent episodes of chest or back discomfort.  Echocardiogram 12/21 showed mild dilation of the ascending aorta measuring 39 mm. Reorder echocardiogram  Fatigue, shortness of breath-stable.  Continues to only notes fatigue and shortness of breath with increased physical activity.  Felt to be related to increased heart rate and atrial fibrillation. Maintain metoprolol Increase physical activity as tolerated    Disposition: Follow-up with Dr. Debara Pickett in 9-12 months.  Jossie Ng. Shloma Roggenkamp NP-C    04/21/2022, 11:49 AM [place] [address] Office [phone] Fax [phone]  Notice: This dictation was prepared with Dragon dictation along with smaller phrase technology. Any transcriptional errors that result from this process are unintentional and may not be corrected upon review.  I spent 14 minutes examining this patient, reviewing medications, and using patient centered shared decision making involving her cardiac care.  Prior to her visit I spent greater than 20 minutes reviewing her past medical history,  medications, and prior cardiac tests.

## 2022-04-21 ENCOUNTER — Ambulatory Visit: Payer: Medicare Other | Attending: General Practice | Admitting: General Practice

## 2022-04-21 ENCOUNTER — Encounter: Payer: Self-pay | Admitting: General Practice

## 2022-04-21 VITALS — BP 142/86 | HR 75 | Ht 70.0 in | Wt 191.8 lb

## 2022-04-21 DIAGNOSIS — R5383 Other fatigue: Secondary | ICD-10-CM | POA: Insufficient documentation

## 2022-04-21 DIAGNOSIS — I4891 Unspecified atrial fibrillation: Secondary | ICD-10-CM | POA: Insufficient documentation

## 2022-04-21 DIAGNOSIS — I10 Essential (primary) hypertension: Secondary | ICD-10-CM | POA: Insufficient documentation

## 2022-04-21 DIAGNOSIS — Z7901 Long term (current) use of anticoagulants: Secondary | ICD-10-CM | POA: Insufficient documentation

## 2022-04-21 DIAGNOSIS — I77810 Thoracic aortic ectasia: Secondary | ICD-10-CM | POA: Insufficient documentation

## 2022-04-21 NOTE — Patient Instructions (Addendum)
Medication Instructions:   No changes   *If you need a refill on your cardiac medications before your next appointment, please call your pharmacy*   Lab Work:  Not needed   Testing/Procedures:  Will be schedule at [facility] [address] Your physician has requested that you have an echocardiogram. Echocardiography is a painless test that uses sound waves to create images of your heart. It provides your doctor with information about the size and shape of your heart and how well your heart's chambers and valves are working. This procedure takes approximately one hour. There are no restrictions for this procedure.    Follow-Up: At [facility], you and your health needs are our priority.  As part of our continuing mission to provide you with exceptional heart care, we have created designated Provider Care Teams.  These Care Teams include your primary Cardiologist (physician) and Advanced Practice Providers (APPs -  Physician Assistants and Nurse Practitioners) who all work together to provide you with the care you need, when you need it.   Your next appointment:   9 to 12 month(s)  The format for your next appointment:   In Person  Provider:   Chrystie Nose, MD    Other Instructions  You have been referred to EP- discuss Watchman procedure

## 2022-04-22 ENCOUNTER — Telehealth: Payer: Self-pay

## 2022-04-22 NOTE — Telephone Encounter (Signed)
Per Edd Fabian, called to arrange Watchman consult with Dr. Lalla Brothers.  Left message to call back.

## 2022-04-23 NOTE — Telephone Encounter (Signed)
The patient called and his Watchman consult has been arranged 10/25 with Dr. Lalla Brothers. He was grateful for assistance.

## 2022-04-23 NOTE — Telephone Encounter (Signed)
Left message to call back  

## 2022-05-05 ENCOUNTER — Ambulatory Visit (HOSPITAL_COMMUNITY): Payer: Medicare Other | Attending: General Practice

## 2022-05-05 DIAGNOSIS — I77810 Thoracic aortic ectasia: Secondary | ICD-10-CM | POA: Diagnosis not present

## 2022-05-05 DIAGNOSIS — I4891 Unspecified atrial fibrillation: Secondary | ICD-10-CM | POA: Diagnosis not present

## 2022-05-05 LAB — ECHOCARDIOGRAM COMPLETE
Area-P 1/2: 4.54 cm2
S' Lateral: 3.9 cm

## 2022-05-06 ENCOUNTER — Other Ambulatory Visit: Payer: Self-pay

## 2022-05-06 DIAGNOSIS — I77810 Thoracic aortic ectasia: Secondary | ICD-10-CM

## 2022-05-06 DIAGNOSIS — I4891 Unspecified atrial fibrillation: Secondary | ICD-10-CM

## 2022-05-08 NOTE — Telephone Encounter (Signed)
Tried to call pt. Left message to call back. 

## 2022-05-12 NOTE — Telephone Encounter (Signed)
Tried to call pt, left detailed message explaining ECHO again and to call back or send mychart message with any further questions

## 2022-05-14 ENCOUNTER — Ambulatory Visit (INDEPENDENT_AMBULATORY_CARE_PROVIDER_SITE_OTHER): Payer: Medicare Other | Admitting: Podiatry

## 2022-05-14 DIAGNOSIS — M7752 Other enthesopathy of left foot: Secondary | ICD-10-CM | POA: Diagnosis not present

## 2022-05-14 DIAGNOSIS — L989 Disorder of the skin and subcutaneous tissue, unspecified: Secondary | ICD-10-CM

## 2022-05-14 NOTE — Progress Notes (Signed)
Subjective:  Patient ID: Justin [name], male    DOB: 10-30-1950,  MRN: [id number] HPI Chief Complaint  Patient presents with   Callouses    Left foot has a callus on bottom    71 y.o. male presents with the above complaint.   ROS: Denies fever chills nausea vomiting muscle aches pains calf pain back pain chest pain shortness of breath.  Past Medical History:  Diagnosis Date   Angioedema 03/28/2012   "tongue and throat"- was never known why"was taking humira at the time.   Arthritis    "hands""back"   Asthma    treated for over 60yrs ago   Chronic back pain    scoliosis/spondylosis   Complication of anesthesia    may try fight when waking up   History of bronchitis 10+yrs ago   History of colon polyps    History of gout    last time many yrs ago   History of kidney stones    Joint pain    Joint swelling    Lyme disease    just completed treatment with Doxycycline    Pneumonia 08/2013   Spondylitis, ankylosing (HCC)    Weakness    and numbness in right leg   Past Surgical History:  Procedure Laterality Date   APPENDECTOMY     BILIARY DILATION  07/25/2020   Procedure: BILIARY DILATION;  Surgeon: Clarene Essex, MD;  Location: [facility];  Service: Endoscopy;;   CARDIOVERSION N/A 12/24/2021   Procedure: CARDIOVERSION;  Surgeon: Geralynn Rile, MD;  Location: [place];  Service: Cardiovascular;  Laterality: N/A;   CHOLECYSTECTOMY     COLONOSCOPY     ERCP N/A 07/25/2020   Procedure: ENDOSCOPIC RETROGRADE CHOLANGIOPANCREATOGRAPHY (ERCP);  Surgeon: Clarene Essex, MD;  Location: [facility];  Service: Endoscopy;  Laterality: N/A;   ESOPHAGOGASTRODUODENOSCOPY     knuckle surgery     right middle finger a couple of times   LUMBAR LAMINECTOMY/DECOMPRESSION MICRODISCECTOMY Right 01/11/2013   Procedure: CENTRAL DECOMPRESSION LUMBAR LAMINECTOMYL4-L5 RIGHT, MICRODISCECTOMY L4-L5 RIGHT     (1 LEVEL);  Surgeon: Tobi Bastos, MD;  Location: [facility];  Service: Orthopedics;   Laterality: Right;   REMOVAL OF STONES  07/25/2020   Procedure: REMOVAL OF STONES;  Surgeon: Clarene Essex, MD;  Location: [facility];  Service: Endoscopy;;   SEPTOPLASTY     SHOULDER OPEN ROTATOR CUFF REPAIR Left    x 2;impingement syndrome and torn rotator cuff   SPHINCTEROTOMY  07/25/2020   Procedure: SPHINCTEROTOMY;  Surgeon: Clarene Essex, MD;  [facility];  Service: Endoscopy;;   SPINAL CORD STIMULATOR INSERTION N/A 01/04/2014   Procedure: LUMBAR SPINAL CORD STIMULATOR INSERTION;  Surgeon: Melina Schools, MD;  Location: [place];  Service: Orthopedics;  Laterality: N/A;   TONSILLECTOMY      Current Outpatient Medications:    ADVAIR DISKUS 250-50 MCG/ACT AEPB, Inhale 1 puff into the lungs 2 (two) times daily., Disp: , Rfl:    albuterol (VENTOLIN HFA) 108 (90 Base) MCG/ACT inhaler, Inhale 2 puffs into the lungs every 4 (four) hours as needed for wheezing or shortness of breath., Disp: , Rfl:    cholecalciferol (VITAMIN D3) 25 MCG (1000 UNIT) tablet, Take 1,000 Units by mouth daily., Disp: , Rfl:    metoprolol succinate (TOPROL-XL) 100 MG 24 hr tablet, Take 1 tablet (100 mg total) by mouth daily. (Patient not taking: Reported on 04/21/2022), Disp: 30 tablet, Rfl: 6   metoprolol succinate (TOPROL-XL) 50 MG 24 hr tablet, Take 50 mg by  mouth daily in the afternoon., Disp: , Rfl:    Multiple Vitamins-Minerals (MULTIVITAMIN WITH MINERALS) tablet, Take 1 tablet by mouth daily., Disp: , Rfl:    omeprazole (PRILOSEC) 40 MG capsule, Take 40 mg by mouth daily as needed (avid reflux)., Disp: , Rfl:    OXYCONTIN 30 MG 12 hr tablet, Take 30 mg by mouth every 12 (twelve) hours., Disp: , Rfl:    rivaroxaban (XARELTO) 20 MG TABS tablet, Take 20 mg by mouth at bedtime., Disp: , Rfl:    tiZANidine (ZANAFLEX) 4 MG tablet, Take 4 mg by mouth 2 (two) times daily as needed for muscle spasms., Disp: , Rfl:   Allergies  Allergen Reactions   Indocin [Indomethacin] Other (See Comments)    Severe headaches.     Norco [Hydrocodone-Acetaminophen] Nausea And Vomiting   Review of Systems Objective:  There were no vitals filed for this visit.  General: Well developed, nourished, in no acute distress, alert and oriented x3   Dermatological: Skin is warm, dry and supple bilateral. Nails x 10 are well maintained; remaining integument appears unremarkable at this time. There are no open sores, no preulcerative lesions, no rash or signs of infection present.  Vascular: Dorsalis Pedis artery and Posterior Tibial artery pedal pulses are 2/4 bilateral with immedate capillary fill time. Pedal hair growth present. No varicosities and no lower extremity edema present bilateral.  Left foot does demonstrate venous insufficiency with hyperpigmentation of the leg and some pitting edema.  Neruologic: Grossly intact via light touch bilateral. Vibratory intact via tuning fork bilateral. Protective threshold with Semmes Wienstein monofilament intact to all pedal sites bilateral. Patellar and Achilles deep tendon reflexes 2+ bilateral. No Babinski or clonus noted bilateral.   Musculoskeletal: No gross boney pedal deformities bilateral. No pain, crepitus, or limitation noted with foot and ankle range of motion bilateral. Muscular strength 5/5 in all groups tested bilateral.  Pain on palpation with fluctuance beneath the head of the fifth metatarsal consistent with bursitis.  There is mild erythema surrounding it.  Also overlying this area is a benign skin lesion that is painful.  His foot is narrow most likely he is wearing a curved shoe that is irritating this area.  Gait: Unassisted, Nonantalgic.    Radiographs:  None taken  Assessment & Plan:   Assessment: Bursitis capsulitis fifth metatarsophalangeal joint left foot.  Benign skin lesion fifth metatarsal plantar aspect left foot.  Plan: Discussed etiology pathology conservative versus surgical therapies at this point I injected the bursa today with 2 mg of  dexamethasone local anesthetic.  I also debrided the reactive hyperkeratotic lesion discussed appropriate shoe gear with him and I will follow-up with him as needed.     Orelia Brandstetter T. [place], [place]

## 2022-05-18 NOTE — Telephone Encounter (Signed)
Left message to call back. Will try to call again tomorrow.

## 2022-06-03 ENCOUNTER — Ambulatory Visit: Payer: Medicare Other | Attending: Cardiology | Admitting: Cardiology

## 2022-06-03 ENCOUNTER — Encounter: Payer: Self-pay | Admitting: Cardiology

## 2022-06-03 ENCOUNTER — Encounter: Payer: Self-pay | Admitting: *Deleted

## 2022-06-03 VITALS — Ht 70.0 in | Wt 194.0 lb

## 2022-06-03 DIAGNOSIS — I4891 Unspecified atrial fibrillation: Secondary | ICD-10-CM | POA: Diagnosis not present

## 2022-06-03 NOTE — Progress Notes (Signed)
Electrophysiology Office Note:    Date:  06/03/2022   ID:  MCKADE GURKA, DOB 1951/07/03, MRN [id number]  PCP:  Charlane Ferretti, DO  [facility] Cardiologist:  Chrystie Nose, MD  [facility] Electrophysiologist:  Lanier Prude, MD   Referring MD: Charlane Ferretti, DO   Chief Complaint: Watchman Consult  History of Present Illness:    Justin [name] is a 71 y.o. male who presents for watchman consult at the request of Molli Hazard, NP. Their medical history includes spondylitis and gout.  Today,  He cannot run anymore, because he becomes so short of breath. He still walks for exercise.   His average blood pressure at home is ~120 systolic. His blood pressure is usually higher when it is measured in clinic.  He has a history of bleeding on blood thinners: minor bumps to his arms bruise and bleed. He has significant bruising today on his arms specifically.   He denies any chest pain or peripheral edema. No lightheadedness, headaches, syncope, orthopnea, or PND.    Past Medical History:  Diagnosis Date   Angioedema 03/28/2012   "tongue and throat"- was never known why"was taking humira at the time.   Arthritis    "hands""back"   Asthma    treated for over 67yrs ago   Chronic back pain    scoliosis/spondylosis   Complication of anesthesia    may try fight when waking up   History of bronchitis 10+yrs ago   History of colon polyps    History of gout    last time many yrs ago   History of kidney stones    Joint pain    Joint swelling    Lyme disease    just completed treatment with Doxycycline    Pneumonia 08/2013   Spondylitis, ankylosing (HCC)    Weakness    and numbness in right leg    Past Surgical History:  Procedure Laterality Date   APPENDECTOMY     BILIARY DILATION  07/25/2020   Procedure: BILIARY DILATION;  Surgeon: Vida Rigger, MD;  Location: [facility] ENDOSCOPY;  Service: Endoscopy;;   CARDIOVERSION N/A 12/24/2021   Procedure: CARDIOVERSION;  Surgeon:  Sande Rives, MD;  Location: [facility] ENDOSCOPY;  Service: Cardiovascular;  Laterality: N/A;   CHOLECYSTECTOMY     COLONOSCOPY     ERCP N/A 07/25/2020   Procedure: ENDOSCOPIC RETROGRADE CHOLANGIOPANCREATOGRAPHY (ERCP);  Surgeon: Vida Rigger, MD;  Location: [facility] ENDOSCOPY;  Service: Endoscopy;  Laterality: N/A;   ESOPHAGOGASTRODUODENOSCOPY     knuckle surgery     right middle finger a couple of times   LUMBAR LAMINECTOMY/DECOMPRESSION MICRODISCECTOMY Right 01/11/2013   Procedure: CENTRAL DECOMPRESSION LUMBAR LAMINECTOMYL4-L5 RIGHT, MICRODISCECTOMY L4-L5 RIGHT     (1 LEVEL);  Surgeon: Jacki Cones, MD;  Location: [facility] ORS;  Service: Orthopedics;  Laterality: Right;   REMOVAL OF STONES  07/25/2020   Procedure: REMOVAL OF STONES;  Surgeon: Vida Rigger, MD;  Location: [facility] ENDOSCOPY;  Service: Endoscopy;;   SEPTOPLASTY     SHOULDER OPEN ROTATOR CUFF REPAIR Left    x 2;impingement syndrome and torn rotator cuff   SPHINCTEROTOMY  07/25/2020   Procedure: SPHINCTEROTOMY;  Surgeon: Vida Rigger, MD;  Location: [facility] ENDOSCOPY;  Service: Endoscopy;;   SPINAL CORD STIMULATOR INSERTION N/A 01/04/2014   Procedure: LUMBAR SPINAL CORD STIMULATOR INSERTION;  Surgeon: Venita Lick, MD;  Location: [facility] OR;  Service: Orthopedics;  Laterality: N/A;   TONSILLECTOMY      Current Medications: Current Meds  Medication Sig  albuterol (VENTOLIN HFA) 108 (90 Base) MCG/ACT inhaler Inhale 2 puffs into the lungs every 4 (four) hours as needed for wheezing or shortness of breath.   cholecalciferol (VITAMIN D3) 25 MCG (1000 UNIT) tablet Take 1,000 Units by mouth daily.   metoprolol succinate (TOPROL-XL) 100 MG 24 hr tablet Take 1 tablet (100 mg total) by mouth daily.   Multiple Vitamins-Minerals (MULTIVITAMIN WITH MINERALS) tablet Take 1 tablet by mouth daily.   omeprazole (PRILOSEC) 40 MG capsule Take 40 mg by mouth daily as needed (avid reflux).   OXYCONTIN 30 MG 12 hr tablet Take 30 mg by mouth every 12 (twelve) hours.    rivaroxaban (XARELTO) 20 MG TABS tablet Take 20 mg by mouth at bedtime.   tiZANidine (ZANAFLEX) 4 MG tablet Take 4 mg by mouth 2 (two) times daily as needed for muscle spasms.     Allergies:   Indocin [indomethacin] and Norco [hydrocodone-acetaminophen]   Social History   Socioeconomic History   Marital status: Married    Spouse name: Not on file   Number of children: Not on file   Years of education: Not on file   Highest education level: Not on file  Occupational History   Not on file  Tobacco Use   Smoking status: Former    Types: Cigars   Smokeless tobacco: Never   Tobacco comments:    quit smoking cigarettes 10-21yrs ago  Substance and Sexual Activity   Alcohol use: Yes    Comment: couple of beers a week   Drug use: No   Sexual activity: Yes  Other Topics Concern   Not on file  Social History Narrative   Not on file   Social Determinants of Health   Financial Resource Strain: Not on file  Food Insecurity: Not on file  Transportation Needs: Not on file  Physical Activity: Not on file  Stress: Not on file  Social Connections: Not on file     Family History: The patient's family history includes Hypertension in an other family member.  ROS:   Please see the history of present illness.     (+) Excessive bruising (+) Shortness of Breath  All other systems reviewed and are negative.  EKGs/Labs/Other Studies Reviewed:    The following studies were reviewed today:  May 05, 2022 echo EF 60% RV normal No significant valvular disease Mild dilation of the aortic root  EKG:   EKG is personally reviewed.  No EKG ordered.   Recent Labs: 12/17/2021: BUN 17; Creatinine, Ser 1.30; Hemoglobin 14.7; Platelets 221; Potassium 4.3; Sodium 140   Recent Lipid Panel No results found for: "CHOL", "TRIG", "HDL", "CHOLHDL", "VLDL", "LDLCALC", "LDLDIRECT"  Physical Exam:    VS:  Ht 5\' 10"  (1.778 m)   Wt 194 lb (88 kg)   BMI 27.84 kg/m     Wt Readings from  Last 3 Encounters:  06/03/22 194 lb (88 kg)  04/21/22 191 lb 12.8 oz (87 kg)  01/19/22 195 lb 12.8 oz (88.8 kg)     GEN: Well nourished, well developed in no acute distress HEENT: Normal NECK: No JVD; No carotid bruits LYMPHATICS: No lymphadenopathy CARDIAC: RRR, no murmurs, rubs, gallops RESPIRATORY:  Clear to auscultation without rales, wheezing or rhonchi  ABDOMEN: Soft, non-tender, non-distended MUSCULOSKELETAL:  No edema; No deformity  SKIN: Warm and dry.  Scattered ecchymoses on bilateral upper extremities NEUROLOGIC:  Alert and oriented x 3 PSYCHIATRIC:  Normal affect       ASSESSMENT:    1. Atrial  fibrillation, unspecified type (HCC)    PLAN:    In order of problems listed above:  #Atrial fibrillation Discussed the pathophysiology of atrial fibrillation in detail during today's visit.  We discussed the associated stroke risk and risk mitigation strategies.  We discussed anticoagulation and left atrial appendage occlusion.  He would like to proceed with left atrial appendage occlusion which I think is very reasonable given his vulnerability to significant bruising and bleeding.  -------------------  I have seen [name] in the office today who is being considered for a Watchman left atrial appendage closure device. I believe they will benefit from this procedure given their history of atrial fibrillation, CHA2DS2-VASc score of at least 2 and unadjusted ischemic stroke rate of 2.2% per year. Unfortunately, the patient is not felt to be a long term anticoagulation candidate secondary to significant bleeding/bruising. The patient's chart has been reviewed and I feel that they would be a candidate for short term oral anticoagulation after Watchman implant.   It is my belief that after undergoing a LAA closure procedure, Justin [name] will not need long term anticoagulation which eliminates anticoagulation side effects and major bleeding risk.   Procedural risks for  the Watchman implant have been reviewed with the patient including a 0.5% risk of stroke, <1% risk of perforation and <1% risk of device embolization. Other risks include bleeding, vascular damage, tamponade, worsening renal function, and death. The patient understands these risk and wishes to proceed.     The published clinical data on the safety and effectiveness of WATCHMAN include but are not limited to the following: - Holmes DR, Everlene Farrier, Sick P et al. for the PROTECT AF Investigators. Percutaneous closure of the left atrial appendage versus warfarin therapy for prevention of stroke in patients with atrial fibrillation: a randomised non-inferiority trial. Lancet 2009; 374: 534-42. Everlene Farrier, Doshi SK, Isa Rankin D et al. on behalf of the PROTECT AF Investigators. Percutaneous Left Atrial Appendage Closure for Stroke Prophylaxis in Patients With Atrial Fibrillation 2.3-Year Follow-up of the PROTECT AF (Watchman Left Atrial Appendage System for Embolic Protection in Patients With Atrial Fibrillation) Trial. Circulation 2013; 127:720-729. - Alli O, Doshi S,  Kar S, Reddy VY, Sievert H et al. Quality of Life Assessment in the Randomized PROTECT AF (Percutaneous Closure of the Left Atrial Appendage Versus Warfarin Therapy for Prevention of Stroke in Patients With Atrial Fibrillation) Trial of Patients at Risk for Stroke With Nonvalvular Atrial Fibrillation. J Am Coll Cardiol 2013; 61:1790-8. Aline August DR, Mia [place], Price M, Whisenant B, Sievert H, Doshi S, Huber K, Reddy V. Prospective randomized evaluation of the Watchman left atrial appendage Device in patients with atrial fibrillation versus long-term warfarin therapy; the PREVAIL trial. Journal of the [employer] of Cardiology, Vol. 4, No. 1, 2014, 1-11. - Kar S, Doshi SK, Sadhu A, Horton R, Osorio J et al. Primary outcome evaluation of a next-generation left atrial appendage closure device: results from the PINNACLE FLX trial. Circulation  2021;143(18)1754-1762.    After today's visit with the patient which was dedicated solely for shared decision making visit regarding LAA closure device, the patient decided to proceed with the LAA appendage closure procedure scheduled to be done in the near future at [facility]. Prior to the procedure, I would like to obtain a gated CT scan of the chest with contrast timed for PV/LA visualization.    HAS-BLED score 3 Hypertension Yes  Abnormal renal and liver function (Dialysis, transplant, Cr >2.26  mg/dL /Cirrhosis or Bilirubin >2x Normal or AST/ALT/AP >3x Normal) No  Stroke No  Bleeding No  Labile INR (Unstable/high INR) No  Elderly (>65) Yes  Drugs or alcohol (? 8 drinks/week, anti-plt or NSAID) Yes   CHA2DS2-VASc Score = 2  The patient's score is based upon: CHF History: 0 HTN History: 1 Diabetes History: 0 Stroke History: 0 Vascular Disease History: 0 Age Score: 1 Gender Score: 0   Medication Adjustments/Labs and Tests Ordered: Current medicines are reviewed at length with the patient today.  Concerns regarding medicines are outlined above.   Orders Placed This Encounter  Procedures   CT CARDIAC MORPH/PULM VEIN W/CM&W/O CA SCORE   Basic Metabolic Panel (BMET)   No orders of the defined types were placed in this encounter.  I,Mary Shauna Hugh Buren,acting as a scribe for Lanier Prude, MD.,have documented all relevant documentation on the behalf of Lanier Prude, MD,as directed by  Lanier Prude, MD while in the presence of Lanier Prude, MD.   Signed, Rossie Muskrat. Lalla Brothers, MD, [facility], [facility] 06/03/2022 9:53 PM    Electrophysiology [facility]

## 2022-06-03 NOTE — Patient Instructions (Signed)
Medication Instructions:  None  *If you need a refill on your cardiac medications before your next appointment, please call your pharmacy*   Lab Work: BMP If you have labs (blood work) drawn today and your tests are completely normal, you will receive your results only by: MyChart Message (if you have MyChart) OR A paper copy in the mail If you have any lab test that is abnormal or we need to change your treatment, we will call you to review the results.   Testing/Procedures: Your physician has requested that you have cardiac CT. Cardiac computed tomography (CT) is a painless test that uses an x-ray machine to take clear, detailed pictures of your heart. For further information please visit www.cardiosmart.org. Please follow instruction sheet as given.     Follow-Up: At [facility], you and your health needs are our priority.  As part of our continuing mission to provide you with exceptional heart care, we have created designated Provider Care Teams.  These Care Teams include your primary Cardiologist (physician) and Advanced Practice Providers (APPs -  Physician Assistants and Nurse Practitioners) who all work together to provide you with the care you need, when you need it.  We recommend signing up for the patient portal called "MyChart".  Sign up information is provided on this After Visit Summary.  MyChart is used to connect with patients for Virtual Visits (Telemedicine).  Patients are able to view lab/test results, encounter notes, upcoming appointments, etc.  Non-urgent messages can be sent to your provider as well.   To learn more about what you can do with MyChart, go to [email or web address].    Your next appointment:   Katy Kemp, the Watchman Nurse Navigator, will call you after your CT once the Watchman Team has reviewed your imaging for an update on proceedings. Katy's direct number is [phone] if you need assistance.    Important Information About  [employer]       

## 2022-06-04 LAB — BASIC METABOLIC PANEL
BUN/Creatinine Ratio: 22 (ref 10–24)
BUN: 34 mg/dL — ABNORMAL HIGH (ref 8–27)
CO2: 22 mmol/L (ref 20–29)
Calcium: 9.5 mg/dL (ref 8.6–10.2)
Chloride: 98 mmol/L (ref 96–106)
Creatinine, Ser: 1.54 mg/dL — ABNORMAL HIGH (ref 0.76–1.27)
Sodium: 138 mmol/L (ref 134–144)
eGFR: 49 mL/min/{1.73_m2} — ABNORMAL LOW (ref >59)

## 2022-06-08 ENCOUNTER — Telehealth: Payer: Self-pay

## 2022-06-08 NOTE — Telephone Encounter (Signed)
The patient called the office to schedule his pre-Watchman CT.  Informed him that I do not schedule the CTs, but can help facilitate. He wishes for a late AM or early PM appointment. Will speak with the CT scheduler, arrange, and call back with an appointment.  He was grateful for call and agreed with plan.

## 2022-06-09 NOTE — Telephone Encounter (Signed)
Left message that the CT is scheduled 06/22/22 at noon with 1130 arrival time. Instructed him to respond to MyChart message to confirm OR call back.

## 2022-06-10 DIAGNOSIS — M609 Myositis, unspecified: Secondary | ICD-10-CM | POA: Diagnosis not present

## 2022-06-10 DIAGNOSIS — I480 Paroxysmal atrial fibrillation: Secondary | ICD-10-CM | POA: Diagnosis not present

## 2022-06-10 DIAGNOSIS — R112 Nausea with vomiting, unspecified: Secondary | ICD-10-CM | POA: Diagnosis not present

## 2022-06-10 DIAGNOSIS — J449 Chronic obstructive pulmonary disease, unspecified: Secondary | ICD-10-CM | POA: Diagnosis not present

## 2022-06-10 DIAGNOSIS — Z7289 Other problems related to lifestyle: Secondary | ICD-10-CM | POA: Diagnosis not present

## 2022-06-10 DIAGNOSIS — I700 Atherosclerosis of aorta: Secondary | ICD-10-CM | POA: Diagnosis not present

## 2022-06-10 DIAGNOSIS — M459 Ankylosing spondylitis of unspecified sites in spine: Secondary | ICD-10-CM | POA: Diagnosis not present

## 2022-06-10 DIAGNOSIS — D6869 Other thrombophilia: Secondary | ICD-10-CM | POA: Diagnosis not present

## 2022-06-10 DIAGNOSIS — I10 Essential (primary) hypertension: Secondary | ICD-10-CM | POA: Diagnosis not present

## 2022-06-10 DIAGNOSIS — K769 Liver disease, unspecified: Secondary | ICD-10-CM | POA: Diagnosis not present

## 2022-06-10 DIAGNOSIS — D692 Other nonthrombocytopenic purpura: Secondary | ICD-10-CM | POA: Diagnosis not present

## 2022-06-10 DIAGNOSIS — R7989 Other specified abnormal findings of blood chemistry: Secondary | ICD-10-CM | POA: Diagnosis not present

## 2022-06-12 NOTE — Telephone Encounter (Signed)
Called pt multiple times, will await call back

## 2022-06-15 ENCOUNTER — Telehealth: Payer: Self-pay

## 2022-06-19 ENCOUNTER — Telehealth (HOSPITAL_COMMUNITY): Payer: Self-pay | Admitting: Emergency Medicine

## 2022-06-19 ENCOUNTER — Telehealth (HOSPITAL_COMMUNITY): Payer: Self-pay | Admitting: *Deleted

## 2022-06-19 NOTE — Telephone Encounter (Signed)
Reaching out to patient to offer assistance regarding upcoming cardiac imaging study; pt verbalizes understanding of appt date/time, parking situation and where to check in, pre-test NPO status and medications ordered, and verified current allergies; name and call back number provided for further questions should they arise Rockwell Alexandria RN Navigator Cardiac Imaging [facility] [phone] office [phone] cell   Arrival 1130 WC entrance Denies iv issues Daily meds Thoracic spinal stimulator - will bring remote to turn off for scan

## 2022-06-19 NOTE — Telephone Encounter (Signed)
Attempted to call patient regarding upcoming cardiac CT appointment. °Left message on voicemail with name and callback number ° °Braydon Kullman RN Navigator Cardiac Imaging °[facility] °[phone] Office °[phone] Cell ° °

## 2022-06-22 ENCOUNTER — Ambulatory Visit (HOSPITAL_COMMUNITY)
Admission: RE | Admit: 2022-06-22 | Discharge: 2022-06-22 | Disposition: A | Discharge status: Home / Self Care | Payer: Medicare Other | Source: Ambulatory Visit | Attending: Cardiology | Admitting: Cardiology

## 2022-06-22 DIAGNOSIS — I4891 Unspecified atrial fibrillation: Secondary | ICD-10-CM | POA: Diagnosis not present

## 2022-06-22 MED ORDER — DILTIAZEM HCL 25 MG/5ML IV SOLN: 5.0000 mg | Freq: Once | INTRAVENOUS | Status: AC

## 2022-06-22 MED ORDER — IOHEXOL 350 MG/ML SOLN
100.0000 mL | Freq: Once | INTRAVENOUS | Status: AC | PRN
  Administered 2022-06-22: 95 mL via INTRAVENOUS

## 2022-06-22 MED ORDER — METOPROLOL TARTRATE 5 MG/5ML IV SOLN: 10.0000 mg | INTRAVENOUS | Status: DC | PRN

## 2022-06-22 MED ORDER — DILTIAZEM HCL 25 MG/5ML IV SOLN
INTRAVENOUS | Status: AC
  Administered 2022-06-22: 5 mg via INTRAVENOUS
  Filled 2022-06-22: qty 5

## 2022-06-22 MED ORDER — METOPROLOL TARTRATE 5 MG/5ML IV SOLN
INTRAVENOUS | Status: AC
  Administered 2022-06-22: 10 mg via INTRAVENOUS
  Filled 2022-06-22: qty 10

## 2022-06-23 DIAGNOSIS — G894 Chronic pain syndrome: Secondary | ICD-10-CM | POA: Diagnosis not present

## 2022-06-23 DIAGNOSIS — Z79891 Long term (current) use of opiate analgesic: Secondary | ICD-10-CM | POA: Diagnosis not present

## 2022-06-24 ENCOUNTER — Other Ambulatory Visit: Payer: Self-pay

## 2022-06-24 DIAGNOSIS — I4891 Unspecified atrial fibrillation: Secondary | ICD-10-CM

## 2022-07-09 ENCOUNTER — Other Ambulatory Visit: Payer: Self-pay | Admitting: General Practice

## 2022-07-09 DIAGNOSIS — I4891 Unspecified atrial fibrillation: Secondary | ICD-10-CM

## 2022-08-12 ENCOUNTER — Telehealth: Payer: Self-pay | Admitting: Cardiology

## 2022-08-12 NOTE — Telephone Encounter (Signed)
Patient scheduled for LAAO closure with Watchman 09/03/22. He had an insurance change to [employer]. Given this, we will resend his information to our pre-certification team for approval.   [name] NP-C Structural Heart Team  Phone: [phone]

## 2022-08-26 ENCOUNTER — Ambulatory Visit: Payer: Medicare Other

## 2022-08-27 NOTE — Progress Notes (Signed)
[facility] [place]                                     Cardiology Office Note:    Date:  08/28/2022   ID:  Justin [name], DOB 08/12/1950, MRN [id number]  PCP:  Sueanne Margarita, [place] Cardiologist:  Pixie Casino, MD  [facility] Electrophysiologist:  Vickie Epley, MD   Referring MD: Sueanne Margarita, DO   Set up [place] with Watchman- 09/03/22.  History of Present Illness:    Justin [name] is a 72 y.o. male with a hx of spondylitis, gout, HTN, RBBB, persistent afib (failed DCCV in 12/2021) with excessive bruising who is here to discuss possible upcoming [place] with Watchman- scheduled for 09/03/22.  Justin [name] was referred to Dr .Debara Pickett in 12/2021 for evaluation of new onset atrial fibrillation. He reported fatigue and some shortness of breath. His metoprolol was increased and he was given samples of Xarelto given issues with cost. He also noted issues with excessive bruising. Echo 05/05/22 showed EF 65-70%, severe LAE, mild thickening of AoV and mild aortic dilation.    He underwent DCCV 12/24/2021. He received 3 shocks at 200 J. He converted to 4-5 beats of NSR before returning to atrial fibrillation.   He was recently seen by Dr. Quentin Ore for consideration of [place]. Cardiac CT 06/22/22 showed severe biatrial enlargement with a chicken wing appendage average diameter 19.8 mm depth 22 mm suitable for a 24 mm Watchman FLX device as well as an elevated coronary calcium score.   Today the patient presents to clinic for follow up. No CP. Has chronic SOB that is unchanged. Chronic mild RLE edema but no orthopnea or PND. No dizziness or syncope. No blood in stool or urine. No palpitations.     Past Medical History:  Diagnosis Date   Angioedema 03/28/2012   "tongue and throat"- was never known why"was taking humira at the time.   Arthritis    "hands""back"   Asthma    treated for over 45yrs ago   Chronic back pain     scoliosis/spondylosis   Complication of anesthesia    may try fight when waking up   History of bronchitis 10+yrs ago   History of colon polyps    History of gout    last time many yrs ago   History of kidney stones    Joint pain    Joint swelling    Lyme disease    just completed treatment with Doxycycline    Pneumonia 08/2013   Spondylitis, ankylosing (HCC)    Weakness    and numbness in right leg    Past Surgical History:  Procedure Laterality Date   APPENDECTOMY     BILIARY DILATION  07/25/2020   Procedure: BILIARY DILATION;  Surgeon: Clarene Essex, MD;  Location: [facility];  Service: Endoscopy;;   CARDIOVERSION N/A 12/24/2021   Procedure: CARDIOVERSION;  Surgeon: Geralynn Rile, MD;  Location: [place];  Service: Cardiovascular;  Laterality: N/A;   CHOLECYSTECTOMY     COLONOSCOPY     ERCP N/A 07/25/2020   Procedure: ENDOSCOPIC RETROGRADE CHOLANGIOPANCREATOGRAPHY (ERCP);  Surgeon: Clarene Essex, MD;  Location: [facility];  Service: Endoscopy;  Laterality: N/A;   ESOPHAGOGASTRODUODENOSCOPY     knuckle surgery     right middle finger a couple of times   LUMBAR LAMINECTOMY/DECOMPRESSION MICRODISCECTOMY  Right 01/11/2013   Procedure: CENTRAL DECOMPRESSION LUMBAR LAMINECTOMYL4-L5 RIGHT, MICRODISCECTOMY L4-L5 RIGHT     (1 LEVEL);  Surgeon: Jacki Cones, MD;  Location: [facility];  Service: Orthopedics;  Laterality: Right;   REMOVAL OF STONES  07/25/2020   Procedure: REMOVAL OF STONES;  Surgeon: Vida Rigger, MD;  [facility];  Service: Endoscopy;;   SEPTOPLASTY     SHOULDER OPEN ROTATOR CUFF REPAIR Left    x 2;impingement syndrome and torn rotator cuff   SPHINCTEROTOMY  07/25/2020   Procedure: SPHINCTEROTOMY;  Surgeon: Vida Rigger, MD;  [facility];  Service: Endoscopy;;   SPINAL CORD STIMULATOR INSERTION N/A 01/04/2014   Procedure: LUMBAR SPINAL CORD STIMULATOR INSERTION;  Surgeon: Venita Lick, MD;  Location: [facility];  Service: Orthopedics;   Laterality: N/A;   TONSILLECTOMY      Current Medications: Current Meds  Medication Sig   albuterol (VENTOLIN HFA) 108 (90 Base) MCG/ACT inhaler Inhale 2 puffs into the lungs every 4 (four) hours as needed for wheezing or shortness of breath.   cholecalciferol (VITAMIN D3) 25 MCG (1000 UNIT) tablet Take 1,000 Units by mouth daily.   losartan (COZAAR) 100 MG tablet Take 100 mg by mouth daily.   metoprolol succinate (TOPROL-XL) 100 MG 24 hr tablet TAKE 1 TABLET BY MOUTH EVERY DAY   Multiple Vitamins-Minerals (MULTIVITAMIN WITH MINERALS) tablet Take 1 tablet by mouth daily.   omeprazole (PRILOSEC) 40 MG capsule Take 40 mg by mouth daily as needed (avid reflux).   OXYCONTIN 30 MG 12 hr tablet Take 30 mg by mouth every 12 (twelve) hours.   rivaroxaban (XARELTO) 20 MG TABS tablet Take 20 mg by mouth at bedtime.   tiZANidine (ZANAFLEX) 4 MG tablet Take 4 mg by mouth 2 (two) times daily as needed for muscle spasms.   [DISCONTINUED] losartan (COZAAR) 50 MG tablet 100 mg daily.     Allergies:   Indocin [indomethacin] and Norco [hydrocodone-acetaminophen]   Social History   Socioeconomic History   Marital status: Married    Spouse name: Not on file   Number of children: Not on file   Years of education: Not on file   Highest education level: Not on file  Occupational History   Not on file  Tobacco Use   Smoking status: Former    Types: Cigars   Smokeless tobacco: Never   Tobacco comments:    quit smoking cigarettes 10-18yrs ago  Substance and Sexual Activity   Alcohol use: Yes    Comment: couple of beers a week   Drug use: No   Sexual activity: Yes  Other Topics Concern   Not on file  Social History Narrative   Not on file   Social Determinants of Health   Financial Resource Strain: Not on file  Food Insecurity: Not on file  Transportation Needs: Not on file  Physical Activity: Not on file  Stress: Not on file  Social Connections: Not on file     Family History: The  patient's family history includes Hypertension in an other family member.  ROS:   Please see the history of present illness.    All other systems reviewed and are negative.  EKGs/Labs/Other Studies Reviewed:    The following studies were reviewed today:  CT Jul 06, 2022 IMPRESSION: 1.  Severe bi atrial enlargement   2.  No PFO/ASD   3. Chicken Wing appendage average diameter 19.8 mm depth 22 mm Suitable for a 24 mm Watchman FLX device   4.  Coronary calcium  score 1208, 89 th percentile for age/sex   5.  Normal ascending thoracic aorta 3.7 cm   6.  Normal PV anatomy see measurements above   7.  No pericardial effusion   EKG:  EKG is ordered today.  The ekg ordered today demonstrates afib with RBBB, LAFB HR 81  Recent Labs: 12/17/2021: Hemoglobin 14.7; Platelets 221 06/03/2022: BUN 34; Creatinine, Ser 1.54; Potassium CANCELED; Sodium 138  Recent Lipid Panel No results found for: "CHOL", "TRIG", "HDL", "CHOLHDL", "VLDL", "LDLCALC", "LDLDIRECT"   Risk Assessment/Calculations:    HAS-BLED score 3 Hypertension Yes  Abnormal renal and liver function (Dialysis, transplant, Cr >2.26 mg/dL /Cirrhosis or Bilirubin >2x Normal or AST/ALT/AP >3x Normal) No  Stroke No  Bleeding No  Labile INR (Unstable/high INR) No  Elderly (>65) Yes  Drugs or alcohol (? 8 drinks/week, anti-plt or NSAID) Yes    CHA2DS2-VASc Score = 2  The patient's score is based upon: CHF History: 0 HTN History: 1 Diabetes History: 0 Stroke History: 0 Vascular Disease History: 0 Age Score: 1 Gender Score: 0  Physical Exam:    VS:  BP (!) 150/90   Pulse 81   Ht 5\' 11"  (1.803 m)   Wt 195 lb (88.5 kg)   SpO2 98%   BMI 27.20 kg/m     Wt Readings from Last 3 Encounters:  08/28/22 195 lb (88.5 kg)  06/03/22 194 lb (88 kg)  04/21/22 191 lb 12.8 oz (87 kg)     GEN:  Well nourished, well developed in no acute distress HEENT: Normal NECK: No JVD LYMPHATICS: No lymphadenopathy CARDIAC: irreg irreg.  no  murmurs, rubs, gallops RESPIRATORY:  Clear to auscultation without rales, wheezing or rhonchi  ABDOMEN: Soft, non-tender, non-distended MUSCULOSKELETAL:  No edema; No deformity  SKIN: Warm and dry NEUROLOGIC:  Alert and oriented x 3 PSYCHIATRIC:  Normal affect   ASSESSMENT:    1. Longstanding persistent atrial fibrillation (HCC)   2. Anticoagulated   3. At high risk for bleeding    PLAN:    In order of problems listed above:  Persistent atrial fibrillation with high risk of bleeding due to excessive bruising: Set up for LAAO with Watchman 09/03/22. ECG/BMET/CBC today. Instruction letter and soap given to pt.      Medication Adjustments/Labs and Tests Ordered: Current medicines are reviewed at length with the patient today.  Concerns regarding medicines are outlined above.  Orders Placed This Encounter  Procedures   CBC   Basic metabolic panel   EKG 12-Lead   No orders of the defined types were placed in this encounter.   Patient Instructions  Medication Instructions:  Your physician recommends that you continue on your current medications as directed. Please refer to the Current Medication list given to you today.  *If you need a refill on your cardiac medications before your next appointment, please call your pharmacy*   Lab Work: Cbc, bmet  If you have labs (blood work) drawn today and your tests are completely normal, you will receive your results only by: MyChart Message (if you have MyChart) OR A paper copy in the mail If you have any lab test that is abnormal or we need to change your treatment, we will call you to review the results.   Follow-Up: At [facility], you and your health needs are our priority.  As part of our continuing mission to provide you with exceptional heart care, we have created designated Provider Care Teams.  These Care Teams include your  primary Cardiologist (physician) and Advanced Practice Providers (APPs -  Physician  Assistants and Nurse Practitioners) who all work together to provide you with the care you need, when you need it.   Your next appointment:   To be determined     Signed, Angelena Form, PA-C  08/28/2022 12:00 PM    [place]

## 2022-08-28 ENCOUNTER — Ambulatory Visit: Payer: Medicare Other | Attending: Physician Assistant | Admitting: Physician Assistant

## 2022-08-28 VITALS — BP 150/90 | HR 81 | Ht 71.0 in | Wt 195.0 lb

## 2022-08-28 DIAGNOSIS — I4811 Longstanding persistent atrial fibrillation: Secondary | ICD-10-CM

## 2022-08-28 DIAGNOSIS — Z9189 Other specified personal risk factors, not elsewhere classified: Secondary | ICD-10-CM

## 2022-08-28 DIAGNOSIS — Z7901 Long term (current) use of anticoagulants: Secondary | ICD-10-CM | POA: Diagnosis not present

## 2022-08-28 NOTE — Patient Instructions (Signed)
Medication Instructions:  Your physician recommends that you continue on your current medications as directed. Please refer to the Current Medication list given to you today.  *If you need a refill on your cardiac medications before your next appointment, please call your pharmacy*   Lab Work: Cbc, bmet  If you have labs (blood work) drawn today and your tests are completely normal, you will receive your results only by: [place] (if you have MyChart) OR A paper copy in the mail If you have any lab test that is abnormal or we need to change your treatment, we will call you to review the results.   Follow-Up: At [facility], you and your health needs are our priority.  As part of our continuing mission to provide you with exceptional heart care, we have created designated Provider Care Teams.  These Care Teams include your primary Cardiologist (physician) and Advanced Practice Providers (APPs -  Physician Assistants and Nurse Practitioners) who all work together to provide you with the care you need, when you need it.   Your next appointment:   To be determined

## 2022-08-29 LAB — BASIC METABOLIC PANEL
BUN/Creatinine Ratio: 14 (ref 10–24)
BUN: 24 mg/dL (ref 8–27)
CO2: 22 mmol/L (ref 20–29)
Calcium: 9.5 mg/dL (ref 8.6–10.2)
Chloride: 99 mmol/L (ref 96–106)
Creatinine, Ser: 1.74 mg/dL — ABNORMAL HIGH (ref 0.76–1.27)
Glucose: 91 mg/dL (ref 70–99)
Potassium: 4.1 mmol/L (ref 3.5–5.2)
Sodium: 138 mmol/L (ref 134–144)
eGFR: 42 mL/min/{1.73_m2} — ABNORMAL LOW (ref >59)

## 2022-08-29 LAB — CBC
Hematocrit: 39.9 % (ref 37.5–51.0)
Hemoglobin: 13.6 g/dL (ref 13.0–17.7)
MCH: 32.5 pg (ref 26.6–33.0)
MCHC: 34.1 g/dL (ref 31.5–35.7)
MCV: 95 fL (ref 79–97)
Platelets: 166 10*3/uL (ref 150–450)
RBC: 4.19 x10E6/uL (ref 4.14–5.80)
RDW: 11.8 % (ref 11.6–15.4)
WBC: 3.8 10*3/uL (ref 3.4–10.8)

## 2022-08-31 ENCOUNTER — Other Ambulatory Visit: Payer: Self-pay

## 2022-08-31 DIAGNOSIS — I4811 Longstanding persistent atrial fibrillation: Secondary | ICD-10-CM

## 2022-09-01 ENCOUNTER — Telehealth: Payer: Self-pay

## 2022-09-01 NOTE — Progress Notes (Signed)
Patient is scheduled for surgery on Thursday, January 25th, at 11:45 o'clock with Dr. Quentin Ore. Patient will be at the hospital at 09:00 o'clock. Patient received pre-procedure instructions. Patient was instructed to call if he will have questions/concerns prior to procedure.

## 2022-09-01 NOTE — Telephone Encounter (Signed)
Confirmed procedure date of 09/03/2022. Confirmed arrival time of 0900 for procedure time at 1130. Reviewed pre-procedure instructions with patient. The patient understands to call if questions/concerns arise prior to procedure.

## 2022-09-03 ENCOUNTER — Inpatient Hospital Stay (HOSPITAL_COMMUNITY)
Admission: RE | Admit: 2022-09-03 | Discharge: 2022-09-03 | DRG: 310 | Disposition: A | Discharge status: Home / Self Care | Payer: Medicare Other | Attending: Cardiology | Admitting: Cardiology

## 2022-09-03 ENCOUNTER — Inpatient Hospital Stay (HOSPITAL_COMMUNITY): Payer: Medicare Other

## 2022-09-03 ENCOUNTER — Inpatient Hospital Stay (HOSPITAL_COMMUNITY): Payer: Medicare Other | Admitting: Certified Registered"

## 2022-09-03 ENCOUNTER — Other Ambulatory Visit (HOSPITAL_COMMUNITY): Payer: Self-pay

## 2022-09-03 ENCOUNTER — Encounter (HOSPITAL_COMMUNITY): Payer: Self-pay | Admitting: Cardiology

## 2022-09-03 ENCOUNTER — Other Ambulatory Visit: Payer: Self-pay

## 2022-09-03 ENCOUNTER — Encounter (HOSPITAL_COMMUNITY)
Admission: RE | Disposition: A | Discharge status: Home / Self Care | Payer: Self-pay | Source: Home / Self Care | Attending: Cardiology

## 2022-09-03 DIAGNOSIS — I4819 Other persistent atrial fibrillation: Secondary | ICD-10-CM | POA: Diagnosis not present

## 2022-09-03 DIAGNOSIS — Z5309 Procedure and treatment not carried out because of other contraindication: Secondary | ICD-10-CM | POA: Diagnosis not present

## 2022-09-03 DIAGNOSIS — I4510 Unspecified right bundle-branch block: Secondary | ICD-10-CM | POA: Diagnosis not present

## 2022-09-03 DIAGNOSIS — J45909 Unspecified asthma, uncomplicated: Secondary | ICD-10-CM | POA: Diagnosis not present

## 2022-09-03 DIAGNOSIS — Z01818 Encounter for other preprocedural examination: Secondary | ICD-10-CM | POA: Diagnosis not present

## 2022-09-03 DIAGNOSIS — N189 Chronic kidney disease, unspecified: Secondary | ICD-10-CM | POA: Diagnosis present

## 2022-09-03 DIAGNOSIS — M109 Gout, unspecified: Secondary | ICD-10-CM | POA: Diagnosis present

## 2022-09-03 DIAGNOSIS — Z8249 Family history of ischemic heart disease and other diseases of the circulatory system: Secondary | ICD-10-CM | POA: Diagnosis not present

## 2022-09-03 DIAGNOSIS — M419 Scoliosis, unspecified: Secondary | ICD-10-CM | POA: Diagnosis present

## 2022-09-03 DIAGNOSIS — Z79899 Other long term (current) drug therapy: Secondary | ICD-10-CM

## 2022-09-03 DIAGNOSIS — I4891 Unspecified atrial fibrillation: Secondary | ICD-10-CM

## 2022-09-03 DIAGNOSIS — Z86010 Personal history of colonic polyps: Secondary | ICD-10-CM

## 2022-09-03 DIAGNOSIS — M159 Polyosteoarthritis, unspecified: Secondary | ICD-10-CM | POA: Diagnosis present

## 2022-09-03 DIAGNOSIS — Z87891 Personal history of nicotine dependence: Secondary | ICD-10-CM

## 2022-09-03 DIAGNOSIS — M5136 Other intervertebral disc degeneration, lumbar region: Secondary | ICD-10-CM | POA: Diagnosis present

## 2022-09-03 DIAGNOSIS — I088 Other rheumatic multiple valve diseases: Secondary | ICD-10-CM | POA: Diagnosis not present

## 2022-09-03 DIAGNOSIS — G8929 Other chronic pain: Secondary | ICD-10-CM | POA: Diagnosis present

## 2022-09-03 DIAGNOSIS — T45515A Adverse effect of anticoagulants, initial encounter: Secondary | ICD-10-CM | POA: Diagnosis not present

## 2022-09-03 DIAGNOSIS — I480 Paroxysmal atrial fibrillation: Principal | ICD-10-CM

## 2022-09-03 DIAGNOSIS — M1990 Unspecified osteoarthritis, unspecified site: Secondary | ICD-10-CM | POA: Diagnosis not present

## 2022-09-03 DIAGNOSIS — I129 Hypertensive chronic kidney disease with stage 1 through stage 4 chronic kidney disease, or unspecified chronic kidney disease: Secondary | ICD-10-CM | POA: Diagnosis present

## 2022-09-03 DIAGNOSIS — Z8619 Personal history of other infectious and parasitic diseases: Secondary | ICD-10-CM | POA: Diagnosis present

## 2022-09-03 DIAGNOSIS — I4811 Longstanding persistent atrial fibrillation: Secondary | ICD-10-CM

## 2022-09-03 HISTORY — PX: TEE WITHOUT CARDIOVERSION: SHX5443

## 2022-09-03 LAB — BASIC METABOLIC PANEL
Anion gap: 13 (ref 5–15)
BUN: 20 mg/dL (ref 8–23)
CO2: 21 mmol/L — ABNORMAL LOW (ref 22–32)
Calcium: 9 mg/dL (ref 8.9–10.3)
Chloride: 102 mmol/L (ref 98–111)
Creatinine, Ser: 1.38 mg/dL — ABNORMAL HIGH (ref 0.61–1.24)
GFR, Estimated: 55 mL/min — ABNORMAL LOW (ref >60)
Glucose, Bld: 111 mg/dL — ABNORMAL HIGH (ref 70–99)
Potassium: 3.6 mmol/L (ref 3.5–5.1)
Sodium: 136 mmol/L (ref 135–145)

## 2022-09-03 LAB — SURGICAL PCR SCREEN
MRSA, PCR: NEGATIVE
Staphylococcus aureus: NEGATIVE

## 2022-09-03 LAB — ECHO TEE

## 2022-09-03 LAB — TYPE AND SCREEN
ABO/RH(D): A NEG
Antibody Screen: NEGATIVE

## 2022-09-03 SURGERY — ECHOCARDIOGRAM, TRANSESOPHAGEAL
Anesthesia: General

## 2022-09-03 MED ORDER — SODIUM CHLORIDE 0.9 % IV SOLN: 250.0000 mL | INTRAVENOUS | Status: DC | PRN

## 2022-09-03 MED ORDER — ONDANSETRON HCL 4 MG/2ML IJ SOLN
INTRAMUSCULAR | Status: DC | PRN
  Administered 2022-09-03: 4 mg via INTRAVENOUS

## 2022-09-03 MED ORDER — ROCURONIUM BROMIDE 10 MG/ML (PF) SYRINGE
PREFILLED_SYRINGE | INTRAVENOUS | Status: DC | PRN
  Administered 2022-09-03: 50 mg via INTRAVENOUS

## 2022-09-03 MED ORDER — SODIUM CHLORIDE 0.9 % IV SOLN: INTRAVENOUS | Status: DC

## 2022-09-03 MED ORDER — HEPARIN (PORCINE) IN NACL 1000-0.9 UT/500ML-% IV SOLN
INTRAVENOUS | Status: AC
  Filled 2022-09-03: qty 500

## 2022-09-03 MED ORDER — IPRATROPIUM-ALBUTEROL 0.5-2.5 (3) MG/3ML IN SOLN
3.0000 mL | Freq: Once | RESPIRATORY_TRACT | Status: AC
  Administered 2022-09-03: 3 mL via RESPIRATORY_TRACT

## 2022-09-03 MED ORDER — SUGAMMADEX SODIUM 200 MG/2ML IV SOLN
INTRAVENOUS | Status: DC | PRN
  Administered 2022-09-03: 400 mg via INTRAVENOUS

## 2022-09-03 MED ORDER — HYDRALAZINE HCL 20 MG/ML IJ SOLN
5.0000 mg | Freq: Once | INTRAMUSCULAR | Status: AC
  Administered 2022-09-03: 5 mg via INTRAVENOUS

## 2022-09-03 MED ORDER — LACTATED RINGERS IV SOLN: INTRAVENOUS | Status: DC

## 2022-09-03 MED ORDER — AMLODIPINE BESYLATE 10 MG PO TABS
10.0000 mg | ORAL_TABLET | Freq: Every day | ORAL | 2 refills | Status: DC
  Filled 2022-09-03: qty 60, 60d supply, fill #0

## 2022-09-03 MED ORDER — HEPARIN (PORCINE) IN NACL 2000-0.9 UNIT/L-% IV SOLN
INTRAVENOUS | Status: AC
  Filled 2022-09-03: qty 1000

## 2022-09-03 MED ORDER — LABETALOL HCL 5 MG/ML IV SOLN
10.0000 mg | Freq: Once | INTRAVENOUS | Status: AC
  Administered 2022-09-03: 10 mg via INTRAVENOUS
  Filled 2022-09-03: qty 4

## 2022-09-03 MED ORDER — HEPARIN (PORCINE) IN NACL 1000-0.9 UT/500ML-% IV SOLN
INTRAVENOUS | Status: DC | PRN
  Administered 2022-09-03: 500 mL

## 2022-09-03 MED ORDER — CEFAZOLIN SODIUM-DEXTROSE 2-4 GM/100ML-% IV SOLN
2.0000 g | INTRAVENOUS | Status: AC
  Administered 2022-09-03: 2 g via INTRAVENOUS
  Filled 2022-09-03: qty 100

## 2022-09-03 MED ORDER — METOPROLOL SUCCINATE ER 100 MG PO TB24
100.0000 mg | ORAL_TABLET | Freq: Every day | ORAL | Status: DC
  Administered 2022-09-03: 100 mg via ORAL
  Filled 2022-09-03: qty 1

## 2022-09-03 MED ORDER — PROPOFOL 10 MG/ML IV BOLUS
INTRAVENOUS | Status: DC | PRN
  Administered 2022-09-03: 140 mg via INTRAVENOUS

## 2022-09-03 MED ORDER — SODIUM CHLORIDE 0.9% FLUSH: 3.0000 mL | Freq: Two times a day (BID) | INTRAVENOUS | Status: DC

## 2022-09-03 MED ORDER — DEXAMETHASONE SODIUM PHOSPHATE 10 MG/ML IJ SOLN
INTRAMUSCULAR | Status: DC | PRN
  Administered 2022-09-03: 5 mg via INTRAVENOUS

## 2022-09-03 MED ORDER — AMLODIPINE BESYLATE 5 MG PO TABS
10.0000 mg | ORAL_TABLET | Freq: Every day | ORAL | Status: DC
  Administered 2022-09-03: 10 mg via ORAL
  Filled 2022-09-03: qty 2

## 2022-09-03 MED ORDER — HYDRALAZINE HCL 25 MG PO TABS
25.0000 mg | ORAL_TABLET | Freq: Once | ORAL | Status: AC
  Administered 2022-09-03: 25 mg via ORAL
  Filled 2022-09-03 (×2): qty 1

## 2022-09-03 MED ORDER — CHLORHEXIDINE GLUCONATE 0.12 % MT SOLN
OROMUCOSAL | Status: AC
  Administered 2022-09-03: 15 mL
  Filled 2022-09-03: qty 15

## 2022-09-03 MED ORDER — HYDRALAZINE HCL 25 MG PO TABS: 25.0000 mg | ORAL_TABLET | Freq: Three times a day (TID) | ORAL | Status: DC

## 2022-09-03 MED ORDER — IPRATROPIUM-ALBUTEROL 0.5-2.5 (3) MG/3ML IN SOLN
RESPIRATORY_TRACT | Status: AC
  Filled 2022-09-03: qty 3

## 2022-09-03 MED ORDER — HYDRALAZINE HCL 25 MG PO TABS
25.0000 mg | ORAL_TABLET | Freq: Three times a day (TID) | ORAL | 0 refills | Status: DC
  Filled 2022-09-03: qty 120, 40d supply, fill #0

## 2022-09-03 MED ORDER — SODIUM CHLORIDE 0.9% FLUSH: 3.0000 mL | INTRAVENOUS | Status: DC | PRN

## 2022-09-03 MED ORDER — ALBUTEROL SULFATE (2.5 MG/3ML) 0.083% IN NEBU: 2.5000 mg | INHALATION_SOLUTION | RESPIRATORY_TRACT | Status: DC | PRN

## 2022-09-03 MED ORDER — CHLORHEXIDINE GLUCONATE 4 % EX LIQD
Freq: Once | CUTANEOUS | Status: DC
  Filled 2022-09-03: qty 15

## 2022-09-03 MED ORDER — LABETALOL HCL 5 MG/ML IV SOLN
10.0000 mg | Freq: Once | INTRAVENOUS | Status: AC
  Administered 2022-09-03: 10 mg via INTRAVENOUS

## 2022-09-03 MED ORDER — LACTATED RINGERS IV SOLN: INTRAVENOUS | Status: DC | PRN

## 2022-09-03 MED ORDER — LIDOCAINE 2% (20 MG/ML) 5 ML SYRINGE
INTRAMUSCULAR | Status: DC | PRN
  Administered 2022-09-03: 60 mg via INTRAVENOUS

## 2022-09-03 MED ORDER — MIDAZOLAM HCL 2 MG/2ML IJ SOLN
INTRAMUSCULAR | Status: DC | PRN
  Administered 2022-09-03: 2 mg via INTRAVENOUS

## 2022-09-03 MED ORDER — HEPARIN (PORCINE) IN NACL 2000-0.9 UNIT/L-% IV SOLN
INTRAVENOUS | Status: DC | PRN
  Administered 2022-09-03: 1000 mL

## 2022-09-03 MED ORDER — ONDANSETRON HCL 4 MG/2ML IJ SOLN: 4.0000 mg | Freq: Four times a day (QID) | INTRAMUSCULAR | Status: DC | PRN

## 2022-09-03 MED ORDER — HYDRALAZINE HCL 20 MG/ML IJ SOLN
INTRAMUSCULAR | Status: AC
  Filled 2022-09-03: qty 1

## 2022-09-03 MED ORDER — FENTANYL CITRATE (PF) 100 MCG/2ML IJ SOLN
INTRAMUSCULAR | Status: DC | PRN
  Administered 2022-09-03: 100 ug via INTRAVENOUS

## 2022-09-03 MED ORDER — LABETALOL HCL 5 MG/ML IV SOLN
INTRAVENOUS | Status: DC | PRN
  Administered 2022-09-03: 5 mg via INTRAVENOUS

## 2022-09-03 MED ORDER — ESMOLOL HCL 100 MG/10ML IV SOLN
INTRAVENOUS | Status: DC | PRN
  Administered 2022-09-03 (×2): 30 mg via INTRAVENOUS

## 2022-09-03 SURGICAL SUPPLY — 13 items
CATH DIAG 6FR PIGTAIL ANGLED (CATHETERS) IMPLANT
CLOSURE PERCLOSE PROSTYLE (VASCULAR PRODUCTS) IMPLANT
DILATOR VESSEL 38 20CM 11FR (INTRODUCER) IMPLANT
KIT HEART LEFT (KITS) ×2 IMPLANT
KIT SHEA VERSACROSS LAAC CONNE (KITS) IMPLANT
PACK CARDIAC CATHETERIZATION (CUSTOM PROCEDURE TRAY) ×2 IMPLANT
PAD DEFIB RADIO PHYSIO CONN (PAD) ×2 IMPLANT
SHEATH PINNACLE 8F 10CM (SHEATH) IMPLANT
SHEATH PROBE COVER 6X72 (BAG) ×2 IMPLANT
SYS WATCHMAN FXD DBL (SHEATH) ×1
SYSTEM WATCHMAN FXD DBL (SHEATH) IMPLANT
TRANSDUCER W/STOPCOCK (MISCELLANEOUS) ×2 IMPLANT
TUBING CIL FLEX 10 FLL-RA (TUBING) IMPLANT

## 2022-09-03 NOTE — H&P (Signed)
Electrophysiology Office Note:     Date:  09/03/2022    ID:  Justin [name], Justin [name] 12-18-1950, MRN [id number]   PCP:  Charlane Ferretti, DO    [facility] Cardiologist:  Chrystie Nose, MD  [facility] Electrophysiologist:  Lanier Prude, MD    Referring MD: Charlane Ferretti, DO    Chief Complaint: Watchman Consult   History of Present Illness:     Justin [name] is a 72 y.o. male who presents for watchman consult at the request of Molli Hazard, NP. Their medical history includes spondylitis and gout.   Today,   He cannot run anymore, because he becomes so short of breath. He still walks for exercise.    His average blood pressure at home is ~120 systolic. His blood pressure is usually higher when it is measured in clinic.   He has a history of bleeding on blood thinners: minor bumps to his arms bruise and bleed. He has significant bruising today on his arms specifically.    He denies any chest pain or peripheral edema. No lightheadedness, headaches, syncope, orthopnea, or PND.   Presents for LAAO today.   Objective      Past Medical History:  Diagnosis Date   Angioedema 03/28/2012    "tongue and throat"- was never known why"was taking humira at the time.   Arthritis      "hands""back"   Asthma      treated for over 60yrs ago   Chronic back pain      scoliosis/spondylosis   Complication of anesthesia      may try fight when waking up   History of bronchitis 10+yrs ago   History of colon polyps     History of gout      last time many yrs ago   History of kidney stones     Joint pain     Joint swelling     Lyme disease      just completed treatment with Doxycycline    Pneumonia 08/2013   Spondylitis, ankylosing (HCC)     Weakness      and numbness in right leg           Past Surgical History:  Procedure Laterality Date   APPENDECTOMY       BILIARY DILATION   07/25/2020    Procedure: BILIARY DILATION;  Surgeon: Vida Rigger, MD;  [facility]: [facility];   Service: Endoscopy;;   CARDIOVERSION N/A 12/24/2021    Procedure: CARDIOVERSION;  Surgeon: Sande Rives, MD;  Location: [facility];  Service: Cardiovascular;  Laterality: N/A;   CHOLECYSTECTOMY       COLONOSCOPY       ERCP N/A 07/25/2020    Procedure: ENDOSCOPIC RETROGRADE CHOLANGIOPANCREATOGRAPHY (ERCP);  Surgeon: Vida Rigger, MD;  Location: [facility];  Service: Endoscopy;  Laterality: N/A;   ESOPHAGOGASTRODUODENOSCOPY       knuckle surgery        right middle finger a couple of times   LUMBAR LAMINECTOMY/DECOMPRESSION MICRODISCECTOMY Right 01/11/2013    Procedure: CENTRAL DECOMPRESSION LUMBAR LAMINECTOMYL4-L5 RIGHT, MICRODISCECTOMY L4-L5 RIGHT     (1 LEVEL);  Surgeon: Jacki Cones, MD;  Location: [facility];  Service: Orthopedics;  Laterality: Right;   REMOVAL OF STONES   07/25/2020    Procedure: REMOVAL OF STONES;  Surgeon: Vida Rigger, MD;  [facility]: [facility];  Service: Endoscopy;;   SEPTOPLASTY       SHOULDER OPEN ROTATOR CUFF REPAIR Left      x  2;impingement syndrome and torn rotator cuff   SPHINCTEROTOMY   07/25/2020    Procedure: SPHINCTEROTOMY;  Surgeon: Clarene Essex, MD;  [facility];  Service: Endoscopy;;   SPINAL CORD STIMULATOR INSERTION N/A 01/04/2014    Procedure: LUMBAR SPINAL CORD STIMULATOR INSERTION;  Surgeon: Melina Schools, MD;  Location: [place];  Service: Orthopedics;  Laterality: N/A;   TONSILLECTOMY          Current Medications: Active Medications      Current Meds  Medication Sig   albuterol (VENTOLIN HFA) 108 (90 Base) MCG/ACT inhaler Inhale 2 puffs into the lungs every 4 (four) hours as needed for wheezing or shortness of breath.   cholecalciferol (VITAMIN D3) 25 MCG (1000 UNIT) tablet Take 1,000 Units by mouth daily.   metoprolol succinate (TOPROL-XL) 100 MG 24 hr tablet Take 1 tablet (100 mg total) by mouth daily.   Multiple Vitamins-Minerals (MULTIVITAMIN WITH MINERALS) tablet Take 1 tablet by mouth daily.   omeprazole (PRILOSEC) 40  MG capsule Take 40 mg by mouth daily as needed (avid reflux).   OXYCONTIN 30 MG 12 hr tablet Take 30 mg by mouth every 12 (twelve) hours.   rivaroxaban (XARELTO) 20 MG TABS tablet Take 20 mg by mouth at bedtime.   tiZANidine (ZANAFLEX) 4 MG tablet Take 4 mg by mouth 2 (two) times daily as needed for muscle spasms.        Allergies:   Indocin [indomethacin] and Norco [hydrocodone-acetaminophen]    Social History         Socioeconomic History   Marital status: Married      Spouse name: Not on file   Number of children: Not on file   Years of education: Not on file   Highest education level: Not on file  Occupational History   Not on file  Tobacco Use   Smoking status: Former      Types: Cigars   Smokeless tobacco: Never   Tobacco comments:      quit smoking cigarettes 10-50yrs ago  Substance and Sexual Activity   Alcohol use: Yes      Comment: couple of beers a week   Drug use: No   Sexual activity: Yes  Other Topics Concern   Not on file  Social History Narrative   Not on file    Social Determinants of Health    Financial Resource Strain: Not on file  Food Insecurity: Not on file  Transportation Needs: Not on file  Physical Activity: Not on file  Stress: Not on file  Social Connections: Not on file      Family History: The patient's family history includes Hypertension in an other family member.   ROS:   Please see the history of present illness.      (+) Excessive bruising (+) Shortness of Breath   All other systems reviewed and are negative.   EKGs/Labs/Other Studies Reviewed:     The following studies were reviewed today:   May 05, 2022 echo EF 60% RV normal No significant valvular disease Mild dilation of the aortic root   EKG:   EKG is personally reviewed.  No EKG ordered.     Recent Labs: 12/17/2021: BUN 17; Creatinine, Ser 1.30; Hemoglobin 14.7; Platelets 221; Potassium 4.3; Sodium 140    Recent Lipid Panel Labs (Brief)  No  results found for: "CHOL", "TRIG", "HDL", "CHOLHDL", "VLDL", "LDLCALC", "LDLDIRECT"     Physical Exam:     VS:  179/111 107 18 98.64F  Wt Readings from Last 3 Encounters:  06/03/22 194 lb (88 kg)  04/21/22 191 lb 12.8 oz (87 kg)  01/19/22 195 lb 12.8 oz (88.8 kg)      GEN: Well nourished, well developed in no acute distress HEENT: Normal NECK: No JVD; No carotid bruits LYMPHATICS: No lymphadenopathy CARDIAC: RRR, no murmurs, rubs, gallops RESPIRATORY:  Clear to auscultation without rales, wheezing or rhonchi  ABDOMEN: Soft, non-tender, non-distended MUSCULOSKELETAL:  No edema; No deformity  SKIN: Warm and dry.  Scattered ecchymoses on bilateral upper extremities NEUROLOGIC:  Alert and oriented x 3 PSYCHIATRIC:  Normal affect          Assessment ASSESSMENT:     1. Atrial fibrillation, unspecified type (HCC)     PLAN:     In order of problems listed above:   #Atrial fibrillation Discussed the pathophysiology of atrial fibrillation in detail during today's visit.  We discussed the associated stroke risk and risk mitigation strategies.  We discussed anticoagulation and left atrial appendage occlusion.  He would like to proceed with left atrial appendage occlusion which I think is very reasonable given his vulnerability to significant bruising and bleeding.   -------------------   I have seen Justin [name] in the office today who is being considered for a Watchman left atrial appendage closure device. I believe they will benefit from this procedure given their history of atrial fibrillation, CHA2DS2-VASc score of at least 2 and unadjusted ischemic stroke rate of 2.2% per year. Unfortunately, the patient is not felt to be a long term anticoagulation candidate secondary to significant bleeding/bruising. The patient's chart has been reviewed and I feel that they would be a candidate for short term oral anticoagulation after Watchman implant.    It is my belief that after  undergoing a LAA closure procedure, Justin [name] will not need long term anticoagulation which eliminates anticoagulation side effects and major bleeding risk.    Procedural risks for the Watchman implant have been reviewed with the patient including a 0.5% risk of stroke, <1% risk of perforation and <1% risk of device embolization. Other risks include bleeding, vascular damage, tamponade, worsening renal function, and death. The patient understands these risk and wishes to proceed.       The published clinical data on the safety and effectiveness of WATCHMAN include but are not limited to the following: - Holmes DR, Everlene Farrier, Sick P et al. for the PROTECT AF Investigators. Percutaneous closure of the left atrial appendage versus warfarin therapy for prevention of stroke in patients with atrial fibrillation: a randomised non-inferiority trial. Lancet 2009; 374: 534-42. Everlene Farrier, Doshi SK, Isa Rankin D et al. on behalf of the PROTECT AF Investigators. Percutaneous Left Atrial Appendage Closure for Stroke Prophylaxis in Patients With Atrial Fibrillation 2.3-Year Follow-up of the PROTECT AF (Watchman Left Atrial Appendage System for Embolic Protection in Patients With Atrial Fibrillation) Trial. Circulation 2013; 127:720-729. - Alli O, Doshi S,  Kar S, Reddy VY, Sievert H et al. Quality of Life Assessment in the Randomized PROTECT AF (Percutaneous Closure of the Left Atrial Appendage Versus Warfarin Therapy for Prevention of Stroke in Patients With Atrial Fibrillation) Trial of Patients at Risk for Stroke With Nonvalvular Atrial Fibrillation. J Am Coll Cardiol 2013; 61:1790-8. Aline August DR, [place], Price M, Whisenant B, Sievert H, Doshi S, Huber K, Reddy V. Prospective randomized evaluation of the Watchman left atrial appendage Device in patients with atrial fibrillation versus long-term warfarin therapy; the PREVAIL trial. Journal of  the [employer] of Cardiology, Vol. 4, No. 1, 2014, 1-11. -  Kar S, Doshi SK, Sadhu A, Horton R, Osorio J et al. Primary outcome evaluation of a next-generation left atrial appendage closure device: results from the PINNACLE FLX trial. Circulation 2021;143(18)1754-1762.      After today's visit with the patient which was dedicated solely for shared decision making visit regarding LAA closure device, the patient decided to proceed with the LAA appendage closure procedure scheduled to be done in the near future at [facility]. Prior to the procedure, I would like to obtain a gated CT scan of the chest with contrast timed for PV/LA visualization.      HAS-BLED score 3 Hypertension Yes  Abnormal renal and liver function (Dialysis, transplant, Cr >2.26 mg/dL /Cirrhosis or Bilirubin >2x Normal or AST/ALT/AP >3x Normal) No  Stroke No  Bleeding No  Labile INR (Unstable/high INR) No  Elderly (>65) Yes  Drugs or alcohol (? 8 drinks/week, anti-plt or NSAID) Yes    CHA2DS2-VASc Score = 2  The patient's score is based upon: CHF History: 0 HTN History: 1 Diabetes History: 0 Stroke History: 0 Vascular Disease History: 0 Age Score: 1 Gender Score: 0     Presents for LAAO today. Procedure reviewed including the risks and he wishes to proceed.  Signed, Hilton Cork. Quentin Ore, MD, [facility], [facility] 09/03/2022 Electrophysiology [facility]

## 2022-09-03 NOTE — Anesthesia Postprocedure Evaluation (Signed)
Anesthesia Post Note  Patient: Justin [name]  Procedure(s) Performed: LEFT ATRIAL APPENDAGE OCCLUSION TRANSESOPHAGEAL ECHOCARDIOGRAM (TEE)     Patient location during evaluation: PACU Anesthesia Type: General Level of consciousness: awake and alert Pain management: pain level controlled Vital Signs Assessment: post-procedure vital signs reviewed and stable Respiratory status: spontaneous breathing, nonlabored ventilation, respiratory function stable and patient connected to nasal cannula oxygen Cardiovascular status: blood pressure returned to baseline and stable Postop Assessment: no apparent nausea or vomiting Anesthetic complications: no Comments: - Patient canceled intraopertively prior to procedure starting 2/2 persistent severe HTN, tachycardia, and newly appearing diaphoresis.   No notable events documented.  Last Vitals:  Vitals:   09/03/22 1300 09/03/22 1305  BP: (!) 174/95 (!) 158/77  Pulse: (!) 32 (!) 47  Resp: 18 14  Temp:    SpO2: 100% 100%    Last Pain:  Vitals:   09/03/22 1252  TempSrc: Temporal  PainSc: 0-No pain                 Belenda Cruise P Corrine Tillis

## 2022-09-03 NOTE — Transfer of Care (Signed)
Immediate Anesthesia Transfer of Care Note  Patient: Justin [name]  Procedure(s) Performed: LEFT ATRIAL APPENDAGE OCCLUSION TRANSESOPHAGEAL ECHOCARDIOGRAM (TEE)  Patient Location: Cath Lab  Anesthesia Type:General  Level of Consciousness: awake, alert , and oriented  Airway & Oxygen Therapy: Patient Spontanous Breathing  Post-op Assessment: Report given to RN  Post vital signs: Reviewed and stable  Last Vitals:  Vitals Value Taken Time  BP 155/86 09/03/22 1250  Temp    Pulse 86 09/03/22 1251  Resp 18 09/03/22 1251  SpO2 94 % 09/03/22 1251  Vitals shown include unvalidated device data.  Last Pain:  Vitals:   09/03/22 0949  TempSrc:   PainSc: 0-No pain         Complications: No notable events documented.

## 2022-09-03 NOTE — Discharge Summary (Addendum)
[facility]    Patient ID: Justin [name],  MRN: [id number], DOB/AGE: [birth year] 72 y.o.  Admit date: 09/03/2022 Discharge date: 09/03/2022  Primary Care Physician: Sueanne Margarita, DO  Primary Cardiologist: Pixie Casino, MD  Electrophysiologist: Vickie Epley, MD  Primary Discharge Diagnosis:  Persistent Atrial Fibrillation Poor candidacy for long term anticoagulation due to refusal of long-term oral anticoagulation and excessive bruising   Secondary Discharge Diagnosis:  Spondylitis Gout HTN RBBB Elevated CAC score on cardiac CT  Brief HPI: Justin [name] is a 72 y.o. male with a history of spondylitis, gout, HTN, RBBB, persistent afib (failed DCCV in 12/2021) with excessive bruising who presented to [facility] today for planned LAAO closure.   Justin [name] was referred to Dr. Debara Pickett in 12/2021 for evaluation of new onset atrial fibrillation. He reported fatigue and some shortness of breath. His metoprolol was increased and he was given samples of Xarelto given issues with cost. He also noted issues with excessive bruising. Echo 05/05/22 showed EF 65-70%, severe LAE, mild thickening of AoV and mild aortic dilation.    He underwent DCCV 12/24/2021. He received 3 shocks at 200 J. He converted to 4-5 beats of NSR before returning to atrial fibrillation.    He was recently seen by Dr. Quentin Ore for consideration of [place]. Cardiac CT 06/22/22 showed severe biatrial enlargement with a chicken wing appendage average diameter 19.8 mm depth 22 mm suitable for a 24 mm Watchman FLX device as well as an elevated coronary calcium score.   The risks, benefits, and alternatives to left atrial appendage occlusive device placement device were reviewed with the patient and the patient wished to proceed. The patient was underwent pre procedure CT imaging which showed. Given this, the patient was felt to have adequate anatomy for LAAO closure device which was scheduled on 09/03/22.    Hospital Course:   Unfortunately he was found to be extremely hypertensive after anesthesia induction with SBP>200 and the case was aborted. He was given a breathing treatment due to post intubation wheezing. Home Toprol XL 100mg  was restarted and Amlodipine and Hydralazine were added to his regimen, which were brought to him by the [facility] pharmacy. Unable to add ARB/ACE due to baseline CKD. He has follow up with myself next week with tentative plan to reschedule LAAO closure 3/29 (next available).   Physical Exam: Vitals:   09/03/22 1454 09/03/22 1458 09/03/22 1524 09/03/22 1527  BP: (!) 203/109 (!) 196/103 (!) 162/94 (!) 159/94  Pulse: 88 89 95 96  Resp: (!) 21 (!) 36    Temp:      TempSrc:      SpO2: 94% 94% 94% 93%  Weight:      Height:       General: Well developed, well nourished, NAD Lungs:Clear to ausculation bilaterally.  Cardiovascular: RRR with S1 S2. No murmurs Neuro: Alert and oriented. No focal deficits. No facial asymmetry. MAE spontaneously. Psych: Responds to questions appropriately with normal affect.    Labs:   Lab Results  Component Value Date   WBC 3.8 08/28/2022   HGB 13.6 08/28/2022   HCT 39.9 08/28/2022   MCV 95 08/28/2022   PLT 166 08/28/2022    Recent Labs  Lab 09/03/22 0950  NA 136  K 3.6  CL 102  CO2 21*  BUN 20  CREATININE 1.38*  CALCIUM 9.0  GLUCOSE 111*   Discharge Medications:  Allergies as of 09/03/2022       Reactions  Indocin [indomethacin] Other (See Comments)   Severe headaches.    Norco [hydrocodone-acetaminophen] Nausea And Vomiting        Medication List     TAKE these medications    albuterol 108 (90 Base) MCG/ACT inhaler Commonly known as: VENTOLIN HFA Inhale 2 puffs into the lungs every 4 (four) hours as needed for wheezing or shortness of breath.   amLODipine 10 MG tablet Commonly known as: NORVASC Take 1 tablet (10 mg total) by mouth daily.   cholecalciferol 25 MCG (1000 UNIT) tablet Commonly known as:  VITAMIN D3 Take 1,000 Units by mouth daily.   hydrALAZINE 25 MG tablet Commonly known as: APRESOLINE Take 1 tablet (25 mg total) by mouth 3 (three) times daily.   metoprolol succinate 100 MG 24 hr tablet Commonly known as: TOPROL-XL TAKE 1 TABLET BY MOUTH EVERY DAY   multivitamin with minerals tablet Take 1 tablet by mouth daily.   omeprazole 40 MG capsule Commonly known as: PRILOSEC Take 40 mg by mouth daily.   OVER THE COUNTER MEDICATION Apply 1 application  topically daily. CBD Oil   OxyCONTIN 30 MG 12 hr tablet Generic drug: oxyCODONE Take 30 mg by mouth every 12 (twelve) hours.   rivaroxaban 20 MG Tabs tablet Commonly known as: XARELTO Take 20 mg by mouth at bedtime.   tiZANidine 4 MG tablet Commonly known as: ZANAFLEX Take 4 mg by mouth in the morning and at bedtime.   VITAMIN C PO Take 1 tablet by mouth daily.        Disposition:  Home Discharge Instructions     Diet - low sodium heart healthy   Complete by: As directed    Discharge instructions   Complete by: As directed    Keep a watch on your BP and bring your log to your appointment on Monday.   Increase activity slowly   Complete by: As directed        Follow-up Information     Tommie Raymond, NP Follow up on 09/07/2022.   Specialty: Cardiology Why: @1pm . Please arrive at 1245pm. Contact information: [address] [place] [address] [phone]                Duration of Discharge Encounter: Greater than 30 minutes including physician time.  Signed, [name], NP  09/03/2022 4:51 PM

## 2022-09-03 NOTE — Progress Notes (Signed)
Respirations less labored. [name] at bedside.

## 2022-09-03 NOTE — Anesthesia Procedure Notes (Signed)
Procedure Name: Intubation Date/Time: 09/03/2022 12:29 PM  Performed by: Barrington Ellison, CRNAPre-anesthesia Checklist: Patient identified, Emergency Drugs available, Suction available and Patient being monitored Patient Re-evaluated:Patient Re-evaluated prior to induction Oxygen Delivery Method: Circle System Utilized Preoxygenation: Pre-oxygenation with 100% oxygen Induction Type: IV induction Ventilation: Mask ventilation without difficulty Laryngoscope Size: Mac and 4 Grade View: Grade I Tube type: Oral Tube size: 7.5 mm Number of attempts: 1 Airway Equipment and Method: Stylet and Oral airway Placement Confirmation: ETT inserted through vocal cords under direct vision, positive ETCO2 and breath sounds checked- equal and bilateral Secured at: 22 cm Tube secured with: Tape Dental Injury: Teeth and Oropharynx as per pre-operative assessment

## 2022-09-03 NOTE — Anesthesia Preprocedure Evaluation (Signed)
Anesthesia Evaluation  Patient identified by MRN, date of birth, ID band Patient awake    Reviewed: Allergy & Precautions, NPO status , Patient's Chart, lab work & pertinent test results  Airway Mallampati: II  TM Distance: >3 FB Neck ROM: Full    Dental no notable dental hx.    Pulmonary asthma , former smoker   Pulmonary exam normal        Cardiovascular + dysrhythmias Atrial Fibrillation  Rhythm:Irregular Rate:Normal    1. Left ventricular ejection fraction, by estimation, is 60 to 65%. The  left ventricle has normal function. The left ventricle has no regional  wall motion abnormalities. There is moderate left ventricular hypertrophy.  Left ventricular diastolic  parameters are indeterminate.   2. Right ventricular systolic function is normal. The right ventricular  size is normal.   3. Left atrial size was severely dilated.   4. The mitral valve is normal in structure. No evidence of mitral valve  regurgitation. No evidence of mitral stenosis.   5. The aortic valve is tricuspid. There is mild calcification of the  aortic valve. There is mild thickening of the aortic valve. Aortic valve  regurgitation is not visualized. Aortic valve sclerosis is present, with  no evidence of aortic valve stenosis.   6. Aortic dilatation noted. There is mild dilatation of the aortic root,  measuring 42 mm. There is borderline dilatation of the ascending aorta,  measuring 36 mm.   7. The inferior vena cava is normal in size with greater than 50%  respiratory variability, suggesting right atrial pressure of 3 mmHg.   Comparison(s): SOV 40 mm, ascending aorta 39 mm.     Neuro/Psych negative neurological ROS  negative psych ROS   GI/Hepatic ,GERD  Medicated,,  Endo/Other  negative endocrine ROS    Renal/GU negative Renal ROS  negative genitourinary   Musculoskeletal  (+) Arthritis , Osteoarthritis,    Abdominal Normal abdominal  exam  (+)   Peds  Hematology negative hematology ROS (+)   Anesthesia Other Findings   Reproductive/Obstetrics                             Anesthesia Physical Anesthesia Plan  ASA: 3  Anesthesia Plan: General   Post-op Pain Management:    Induction: Intravenous  PONV Risk Score and Plan: 2 and Ondansetron, Dexamethasone and Treatment may vary due to age or medical condition  Airway Management Planned: Mask and Oral ETT  Additional Equipment: Arterial line  Intra-op Plan:   Post-operative Plan: Extubation in OR  Informed Consent: I have reviewed the patients History and Physical, chart, labs and discussed the procedure including the risks, benefits and alternatives for the proposed anesthesia with the patient or authorized representative who has indicated his/her understanding and acceptance.     Dental advisory given  Plan Discussed with: CRNA  Anesthesia Plan Comments:        Anesthesia Quick Evaluation

## 2022-09-03 NOTE — Progress Notes (Addendum)
  [place] TEAM  Patient is stable s/p canceled LAAO closure. BP remains elevated above baseline with SBPs >200. He was given a breathing treatment due to post intubation wheezing. I have restarted his home Toprol XL 100mg  and added amlodipine 10mg  to his regimen. This should be brought to him by [facility] pharmacy prior to discharge later today. Unable to add ARB/ACE due to baseline CKD. He has follow up with myself next week with tentative plan to reschedule LAAO closure 3/29 (next available).   [name] NP-C Structural Heart Team  Pager: [phone] Phone: [phone]

## 2022-09-03 NOTE — Progress Notes (Addendum)
Dr. Quentin Ore came to see pt and evaluate BP. Per Dr. Quentin Ore ok to discharge pt to home with new Norvasc prescription as long as bp remains stable (180/102 while Dr. Quentin Ore at bedside).

## 2022-09-03 NOTE — Progress Notes (Signed)
  Echocardiogram Echocardiogram Transesophageal has been performed.  Justin [name] M 09/03/2022, 1:11 PM

## 2022-09-03 NOTE — Progress Notes (Signed)
Dr. Gloris Manchester called and notified of labored breathing and congestion. RT here to administer breathing treatment.

## 2022-09-04 ENCOUNTER — Encounter (HOSPITAL_COMMUNITY): Payer: Self-pay | Admitting: Cardiology

## 2022-09-04 ENCOUNTER — Telehealth: Payer: Self-pay | Admitting: Cardiology

## 2022-09-04 NOTE — Telephone Encounter (Signed)
  [place] TEAM   Patient contacted regarding discharge from [facility] on 09/03/22   Patient understands to follow up with provider [name], NP-C on 09/07/22  Patient understands discharge instructions? Yes  Patient understands medications and regimen? Yes  Patient understands to bring all medications to this visit? Yes   [name] NP-C Structural Heart Team  Pager: [phone]

## 2022-09-07 ENCOUNTER — Ambulatory Visit: Payer: Medicare Other | Attending: Cardiology | Admitting: Cardiology

## 2022-09-07 VITALS — BP 190/80 | HR 84 | Ht 71.0 in | Wt 192.6 lb

## 2022-09-07 DIAGNOSIS — Z7901 Long term (current) use of anticoagulants: Secondary | ICD-10-CM

## 2022-09-07 DIAGNOSIS — Z01818 Encounter for other preprocedural examination: Secondary | ICD-10-CM | POA: Diagnosis not present

## 2022-09-07 DIAGNOSIS — I10 Essential (primary) hypertension: Secondary | ICD-10-CM | POA: Diagnosis not present

## 2022-09-07 DIAGNOSIS — I480 Paroxysmal atrial fibrillation: Secondary | ICD-10-CM

## 2022-09-07 MED ORDER — HYDRALAZINE HCL 50 MG PO TABS: 50.0000 mg | ORAL_TABLET | Freq: Three times a day (TID) | ORAL | 3 refills | Status: DC

## 2022-09-07 NOTE — Patient Instructions (Signed)
Medication Instructions:  Your physician has recommended you make the following change in your medication:  1- TAKE Hydralazine 50 mg by mouth three times a day.  *If you need a refill on your cardiac medications before your next appointment, please call your pharmacy*   Lab Work: If you have labs (blood work) drawn today and your tests are completely normal, you will receive your results only by: [place] (if you have MyChart) OR A paper copy in the mail If you have any lab test that is abnormal or we need to change your treatment, we will call you to review the results.  Follow-Up: At [facility], you and your health needs are our priority.  As part of our continuing mission to provide you with exceptional heart care, we have created designated Provider Care Teams.  These Care Teams include your primary Cardiologist (physician) and Advanced Practice Providers (APPs -  Physician Assistants and Nurse Practitioners) who all work together to provide you with the care you need, when you need it.  We recommend signing up for the patient portal called "MyChart".  Sign up information is provided on this After Visit Summary.  MyChart is used to connect with patients for Virtual Visits (Telemedicine).  Patients are able to view lab/test results, encounter notes, upcoming appointments, etc.  Non-urgent messages can be sent to your provider as well.   To learn more about what you can do with MyChart, go to [email or web address].    Your next appointment:   Someone will call you to schedule.  Provider:   [name], NP    Other Instructions You have been referred to Hypertension Clinic as soon as possible

## 2022-09-08 NOTE — Progress Notes (Signed)
[facility]                                     Cardiology Office Note:    Date:  09/08/2022   ID:  Justin [name], DOB 1951-01-24, MRN [id number]  PCP:  Charlane Ferretti, DO  [facility] Cardiologist:  Chrystie Nose, MD  [facility] Electrophysiologist:  Lanier Prude, MD   Referring MD: Charlane Ferretti, DO   Chief Complaint  Patient presents with   Follow-up    Aborted LAAO f/u; HTN f/u    History of Present Illness:    Justin [name] is a 72 y.o. male with a hx of spondylitis, gout, HTN, RBBB, persistent afib (failed DCCV in 12/2021) with excessive bruising who presented to [facility] today for planned LAAO closure.   Justin [name] was referred to Dr. Rennis Golden in 12/2021 for evaluation of new onset atrial fibrillation. He reported fatigue and some shortness of breath. His metoprolol was increased and he was given samples of Xarelto given issues with cost. He also noted issues with excessive bruising. Echo 05/05/22 showed EF 65-70%, severe LAE, mild thickening of AoV and mild aortic dilation. He underwent DCCV 12/24/2021 with 4-5 beats of NSR before returning to atrial fibrillation.    He was then referred to Dr. Lalla Brothers for consideration of LAAO. Cardiac CT 06/22/22 showed severe biatrial enlargement with a chicken wing appendage average diameter 19.8 mm depth 22 mm suitable for a 24 mm Watchman FLX device as well as an elevated coronary calcium score.    The risks, benefits, and alternatives to left atrial appendage occlusive device placement device were reviewed with the patient and the patient wished to proceed. The patient was underwent pre procedure CT imaging which showed. Given this, the patient was felt to have adequate anatomy for LAAO closure device which was scheduled on 09/03/22.    Unfortunately he was found to be extremely hypertensive after anesthesia induction with SBP>200 and the case was aborted. He was given a breathing treatment due to post intubation  wheezing. Home Toprol XL 100mg  was restarted and Amlodipine and Hydralazine were added to his regimen, which were brought to him by the [facility] pharmacy.   In follow up today, he has signs and symptoms of decompensated heart failure secondary to poorly controlled atrial fibrillation ventricular rates. His Watchman procedure will postponed     He denies chest pain however has c/o LE edema bilaterally with weight gain. No bleeding, orthopnea, SOB, dizziness, or syncope.   Past Medical History:  Diagnosis Date   Angioedema 03/28/2012   "tongue and throat"- was never known why"was taking humira at the time.   Arthritis    "hands""back"   Asthma    treated for over 70yrs ago   Chronic back pain    scoliosis/spondylosis   Complication of anesthesia    may try fight when waking up   History of bronchitis 10+yrs ago   History of colon polyps    History of gout    last time many yrs ago   History of kidney stones    Joint pain    Joint swelling    Lyme disease    just completed treatment with Doxycycline    Pneumonia 08/2013   Spondylitis, ankylosing (HCC)    Weakness    and numbness in right leg    Past Surgical History:  Procedure Laterality Date  APPENDECTOMY     BILIARY DILATION  07/25/2020   Procedure: BILIARY DILATION;  Surgeon: Vida Rigger, MD;  Location: [facility] ENDOSCOPY;  Service: Endoscopy;;   CARDIOVERSION N/A 12/24/2021   Procedure: CARDIOVERSION;  Surgeon: Sande Rives, MD;  Location: [facility] ENDOSCOPY;  Service: Cardiovascular;  Laterality: N/A;   CHOLECYSTECTOMY     COLONOSCOPY     ERCP N/A 07/25/2020   Procedure: ENDOSCOPIC RETROGRADE CHOLANGIOPANCREATOGRAPHY (ERCP);  Surgeon: Vida Rigger, MD;  Location: [facility] ENDOSCOPY;  Service: Endoscopy;  Laterality: N/A;   ESOPHAGOGASTRODUODENOSCOPY     knuckle surgery     right middle finger a couple of times   LUMBAR LAMINECTOMY/DECOMPRESSION MICRODISCECTOMY Right 01/11/2013   Procedure: CENTRAL DECOMPRESSION LUMBAR LAMINECTOMYL4-L5  RIGHT, MICRODISCECTOMY L4-L5 RIGHT     (1 LEVEL);  Surgeon: Jacki Cones, MD;  Location: [facility] ORS;  Service: Orthopedics;  Laterality: Right;   REMOVAL OF STONES  07/25/2020   Procedure: REMOVAL OF STONES;  Surgeon: Vida Rigger, MD;  Location: [facility] ENDOSCOPY;  Service: Endoscopy;;   SEPTOPLASTY     SHOULDER OPEN ROTATOR CUFF REPAIR Left    x 2;impingement syndrome and torn rotator cuff   SPHINCTEROTOMY  07/25/2020   Procedure: SPHINCTEROTOMY;  Surgeon: Vida Rigger, MD;  Location: [facility] ENDOSCOPY;  Service: Endoscopy;;   SPINAL CORD STIMULATOR INSERTION N/A 01/04/2014   Procedure: LUMBAR SPINAL CORD STIMULATOR INSERTION;  Surgeon: Venita Lick, MD;  Location: [facility] OR;  Service: Orthopedics;  Laterality: N/A;   TEE WITHOUT CARDIOVERSION N/A 09/03/2022   Procedure: TRANSESOPHAGEAL ECHOCARDIOGRAM (TEE);  Surgeon: Lanier Prude, MD;  Location: [facility] INVASIVE CV LAB;  Service: Cardiovascular;  Laterality: N/A;   TONSILLECTOMY      Current Medications: Current Meds  Medication Sig   albuterol (VENTOLIN HFA) 108 (90 Base) MCG/ACT inhaler Inhale 2 puffs into the lungs every 4 (four) hours as needed for wheezing or shortness of breath.   amLODipine (NORVASC) 10 MG tablet Take 1 tablet (10 mg total) by mouth daily.   Ascorbic Acid (VITAMIN C PO) Take 1 tablet by mouth daily.   cholecalciferol (VITAMIN D3) 25 MCG (1000 UNIT) tablet Take 1,000 Units by mouth daily.   hydrALAZINE (APRESOLINE) 50 MG tablet Take 1 tablet (50 mg total) by mouth 3 (three) times daily.   metoprolol succinate (TOPROL-XL) 100 MG 24 hr tablet TAKE 1 TABLET BY MOUTH EVERY DAY   Multiple Vitamins-Minerals (MULTIVITAMIN WITH MINERALS) tablet Take 1 tablet by mouth daily.   omeprazole (PRILOSEC) 40 MG capsule Take 40 mg by mouth daily.   OVER THE COUNTER MEDICATION Apply 1 application  topically daily. CBD Oil   OXYCONTIN 30 MG 12 hr tablet Take 30 mg by mouth every 12 (twelve) hours.   rivaroxaban (XARELTO) 20 MG TABS tablet Take 20  mg by mouth at bedtime.   tiZANidine (ZANAFLEX) 4 MG tablet Take 4 mg by mouth in the morning and at bedtime.   [DISCONTINUED] hydrALAZINE (APRESOLINE) 25 MG tablet Take 1 tablet (25 mg total) by mouth 3 (three) times daily.     Allergies:   Indocin [indomethacin] and Norco [hydrocodone-acetaminophen]   Social History   Socioeconomic History   Marital status: Married    Spouse name: Not on file   Number of children: Not on file   Years of education: Not on file   Highest education level: Not on file  Occupational History   Not on file  Tobacco Use   Smoking status: Former    Types: Cigars   Smokeless tobacco: Never  Tobacco comments:    quit smoking cigarettes 10-2yrs ago  Vaping Use   Vaping Use: Never used  Substance and Sexual Activity   Alcohol use: Yes    Comment: couple of beers a week   Drug use: No   Sexual activity: Yes  Other Topics Concern   Not on file  Social History Narrative   Not on file   Social Determinants of Health   Financial Resource Strain: Not on file  Food Insecurity: Not on file  Transportation Needs: Not on file  Physical Activity: Not on file  Stress: Not on file  Social Connections: Not on file     Family History: The patient's family history includes Hypertension in an other family member.  ROS:   Please see the history of present illness.    All other systems reviewed and are negative.  EKGs/Labs/Other Studies Reviewed:    The following studies were reviewed today:  CONCLUSIONS:  1. TEE demonstrating no LAA thrombus 2. Poorly controlled hypertension leading to case cancellation prior to femoral venous access 3. Plan for outpatient BP optimization prior to rescheduling LAAO  Recent Labs: 08/28/2022: Hemoglobin 13.6; Platelets 166 09/03/2022: BUN 20; Creatinine, Ser 1.38; Potassium 3.6; Sodium 136  Recent Lipid Panel No results found for: "CHOL", "TRIG", "HDL", "CHOLHDL", "VLDL", "LDLCALC", "LDLDIRECT"  Physical Exam:     VS:  BP (!) 190/80   Pulse 84   Ht 5\' 11"  (1.803 m)   Wt 192 lb 9.6 oz (87.4 kg)   SpO2 98%   BMI 26.86 kg/m     Wt Readings from Last 3 Encounters:  09/07/22 192 lb 9.6 oz (87.4 kg)  09/03/22 195 lb (88.5 kg)  08/28/22 195 lb (88.5 kg)    General: Well developed, well nourished, NAD Neck: Negative for carotid bruits. No JVD Lungs:Clear to ausculation bilaterally. No wheezes, rales, or rhonchi. Breathing is unlabored. Cardiovascular: Irregular. No murmurs Extremities: 2+ BLE edema.  Neuro: Alert and oriented. No focal deficits. No facial asymmetry. MAE spontaneously. Psych: Responds to questions appropriately with normal affect.    ASSESSMENT/PLAN:    PAF: Seen for pre LAAO evaluation found to have elevated BP on exam at 190/80 with patient readings consistent with these numbers. Plan to cancel case and see the patient back after medication adjustment.     Uncontrolled HTN: BP at 190/80 today with recheck personally performed with similar reading. Plan to add hydralazine to regimen and see back in 1-2 weeks. Could benefit from HTN clinic if BP remains difficult to control. BP has been keeping very detailed log.   Medication Adjustments/Labs and Tests Ordered: Current medicines are reviewed at length with the patient today.  Concerns regarding medicines are outlined above.  Orders Placed This Encounter  Procedures   AMB Referral to [facility] Pharm-D   Meds ordered this encounter  Medications   hydrALAZINE (APRESOLINE) 50 MG tablet    Sig: Take 1 tablet (50 mg total) by mouth 3 (three) times daily.    Dispense:  270 tablet    Refill:  3    Patient Instructions  Medication Instructions:  Your physician has recommended you make the following change in your medication:  1- TAKE Hydralazine 50 mg by mouth three times a day.  *If you need a refill on your cardiac medications before your next appointment, please call your pharmacy*   Lab Work: If you have labs (blood  work) drawn today and your tests are completely normal, you will receive your results only by:  MyChart Message (if you have MyChart) OR A paper copy in the mail If you have any lab test that is abnormal or we need to change your treatment, we will call you to review the results.  Follow-Up: At [facility], you and your health needs are our priority.  As part of our continuing mission to provide you with exceptional heart care, we have created designated Provider Care Teams.  These Care Teams include your primary Cardiologist (physician) and Advanced Practice Providers (APPs -  Physician Assistants and Nurse Practitioners) who all work together to provide you with the care you need, when you need it.  We recommend signing up for the patient portal called "MyChart".  Sign up information is provided on this After Visit Summary.  MyChart is used to connect with patients for Virtual Visits (Telemedicine).  Patients are able to view lab/test results, encounter notes, upcoming appointments, etc.  Non-urgent messages can be sent to your provider as well.   To learn more about what you can do with MyChart, go to [email or web address].    Your next appointment:   Someone will call you to schedule.  Provider:   Georgie Chard, NP    Other Instructions You have been referred to Hypertension Clinic as soon as possible    Signed, Georgie Chard, NP  09/08/2022 7:55 AM    [facility]

## 2022-09-23 ENCOUNTER — Ambulatory Visit: Payer: Medicare Other | Attending: Internal Medicine

## 2022-09-23 ENCOUNTER — Telehealth: Payer: Medicare Other

## 2022-09-23 VITALS — BP 116/77 | HR 77 | Ht 71.0 in | Wt 190.0 lb

## 2022-09-23 DIAGNOSIS — I10 Essential (primary) hypertension: Secondary | ICD-10-CM | POA: Diagnosis not present

## 2022-09-23 DIAGNOSIS — I480 Paroxysmal atrial fibrillation: Secondary | ICD-10-CM | POA: Diagnosis not present

## 2022-09-23 DIAGNOSIS — I4891 Unspecified atrial fibrillation: Secondary | ICD-10-CM | POA: Diagnosis not present

## 2022-09-23 NOTE — Progress Notes (Signed)
Virtual Visit via Telephone Note   Because of Justin [name]'s co-morbid illnesses, he is at least at moderate risk for complications without adequate follow up.  This format is felt to be most appropriate for this patient at this time.  The patient did not have access to video technology/had technical difficulties with video requiring transitioning to audio format only (telephone).  All issues noted in this document were discussed and addressed.  No physical exam could be performed with this format.  Please refer to the patient's chart for his consent to telehealth for [facility].    Date:  09/23/2022   ID:  Justin [name], DOB 1950-09-18, MRN [id number] The patient was identified using 2 identifiers.  Patient Location: Home Provider Location: Office/Clinic   PCP:  Sueanne Margarita, [place] [facility] Cardiologist:  Pixie Casino, MD Electrophysiologist:  Vickie Epley, MD {  Evaluation Performed:  Follow-Up Visit  Chief Complaint:  HTN  History of Present Illness:    Justin [name] is a 72 y.o. male with a hx of spondylitis, gout, HTN, RBBB, persistent afib (failed DCCV in 12/2021) with excessive bruising who presented to [facility] today for planned LAAO closure.   Justin [name] was referred to Dr. Debara Pickett in 12/2021 for evaluation of new onset atrial fibrillation. He reported fatigue and some shortness of breath. His metoprolol was increased and he was given samples of Xarelto given issues with cost. He also noted issues with excessive bruising. Echo 05/05/22 showed EF 65-70%, severe LAE, mild thickening of AoV and mild aortic dilation. He underwent DCCV 12/24/2021 with 4-5 beats of NSR before returning to atrial fibrillation.    He was then referred to Dr. Quentin Ore for consideration of Justin [name]. Cardiac CT 06/22/22 showed severe biatrial enlargement with a chicken wing appendage average diameter 19.8 mm depth 22 mm suitable for a 24 mm Watchman FLX device as  well as an elevated coronary calcium score.    The risks, benefits, and alternatives to left atrial appendage occlusive device placement device were reviewed with the patient and the patient wished to proceed. The patient was underwent pre procedure CT imaging which showed. Given this, the patient was felt to have adequate anatomy for LAAO closure device which was scheduled on 09/03/22.    Unfortunately he was found to be extremely hypertensive after anesthesia induction with SBP>200 and the case was aborted. He was given a breathing treatment due to post intubation wheezing. Home Toprol XL 159m was restarted and Amlodipine and Hydralazine were added to his regimen, [facility] Unable to add ARB/ACE due to baseline CKD. He was seen in follow up and continued to have elevated BP's. His hydralazine was increased with plans for telephone follow up given that HTN clinic had no close availability.   Today he reports that his BP has been more stable. Reading today ar 116/77. He says that AM readings are higher than PM but overall is well controlled. He recently had COVID 10 days ago but is feeling better. Denies chest pain, palpitations, LE edema, SOB, dizziness, or syncope. No bleeding in stool or urine. Tentative plan to reschedule LAAO closure 3/29 (next available).   Past Medical History:  Diagnosis Date   Angioedema 03/28/2012   "tongue and throat"- was never known why"was taking humira at the time.   Arthritis    "hands""back"   Asthma    treated for over 567yrago  Chronic back pain    scoliosis/spondylosis   Complication of anesthesia    may try fight when waking up   History of bronchitis 10+yrs ago   History of colon polyps    History of gout    last time many yrs ago   History of kidney stones    Joint pain    Joint swelling    Lyme disease    just completed treatment with Doxycycline    Pneumonia 08/2013   Spondylitis, ankylosing (HCC)    Weakness     and numbness in right leg   Past Surgical History:  Procedure Laterality Date   APPENDECTOMY     BILIARY DILATION  07/25/2020   Procedure: BILIARY DILATION;  Surgeon: Clarene Essex, MD;  Location: [facility] ENDOSCOPY;  Service: Endoscopy;;   CARDIOVERSION N/A 12/24/2021   Procedure: CARDIOVERSION;  Surgeon: Geralynn Rile, MD;  Location: [place];  Service: Cardiovascular;  Laterality: N/A;   CHOLECYSTECTOMY     COLONOSCOPY     ERCP N/A 07/25/2020   Procedure: ENDOSCOPIC RETROGRADE CHOLANGIOPANCREATOGRAPHY (ERCP);  Surgeon: Clarene Essex, MD;  Location: [facility] ENDOS[facility];  Service: Endoscopy;  Laterality: N/A;   ESOPHAGOGASTRODUODENOSCOPY     knuckle surgery     right middle finger a couple of times   LUMBAR LAMINECTOMY/DECOMPRESSION MICRODISCECTOMY Right 01/11/2013   Procedure: CENTRAL DECOMPRESSION LUMBAR LAMINECTOMYL4-L5 RIGHT, MICRODISCECTOMY L4-L5 RIGHT     (1 LEVEL);  Surgeon: Tobi Bastos, MD;  Location: [facility] ORS;  Service: Orthopedics;  Laterality: Right;   REMOVAL OF STONES  07/25/2020   Procedure: REMOVAL OF STONES;  Surgeon: Clarene Essex, MD;  Location: [facility] ENDOSCOPY;  Service: Endoscopy;;   SEPTOPLASTY     SHOULDER OPEN ROTATOR CUFF REPAIR Left    x 2;impingement syndrome and torn rotator cuff   SPHINCTEROTOMY  07/25/2020   Procedure: SPHINCTEROTOMY;  Surgeon: Clarene Essex, MD;  Location: [facility] ENDOSCOPY;  Service: Endoscopy;;   SPINAL CORD STIMULATOR INSERTION N/A 01/04/2014   Procedure: LUMBAR SPINAL CORD STIMULATOR INSERTION;  Surgeon: Melina Schools, MD;  Location: [place];  Service: Orthopedics;  Laterality: N/A;   TEE WITHOUT CARDIOVERSION N/A 09/03/2022   Procedure: TRANSESOPHAGEAL ECHOCARDIOGRAM (TEE);  Surgeon: Vickie Epley, MD;  Location: [place] CV LAB;  Service: Cardiovascular;  Laterality: N/A;   TONSILLECTOMY       Current Meds  Medication Sig   albuterol (VENTOLIN HFA) 108 (90 Base) MCG/ACT inhaler Inhale 2 puffs into the lungs every 4 (four) hours as needed  for wheezing or shortness of breath.   amLODipine (NORVASC) 10 MG tablet Take 1 tablet (10 mg total) by mouth daily.   Ascorbic Acid (VITAMIN C PO) Take 1 tablet by mouth daily.   cholecalciferol (VITAMIN D3) 25 MCG (1000 UNIT) tablet Take 1,000 Units by mouth daily.   hydrALAZINE (APRESOLINE) 50 MG tablet Take 1 tablet (50 mg total) by mouth 3 (three) times daily.   metoprolol succinate (TOPROL-XL) 100 MG 24 hr tablet TAKE 1 TABLET BY MOUTH EVERY DAY   Multiple Vitamins-Minerals (MULTIVITAMIN WITH MINERALS) tablet Take 1 tablet by mouth daily.   omeprazole (PRILOSEC) 40 MG capsule Take 40 mg by mouth daily.   OVER THE COUNTER MEDICATION Apply 1 application  topically daily. CBD Oil   OXYCONTIN 30 MG 12 hr tablet Take 30 mg by mouth every 12 (twelve) hours.   rivaroxaban (XARELTO) 20 MG TABS tablet Take 20 mg by mouth at bedtime.   tiZANidine (ZANAFLEX) 4 MG tablet Take 4 mg by mouth in  the morning and at bedtime.     Allergies:   Indocin [indomethacin] and Norco [hydrocodone-acetaminophen]   Social History   Tobacco Use   Smoking status: Former    Types: Cigars   Smokeless tobacco: Never   Tobacco comments:    quit smoking cigarettes 10-33yr ago  Vaping Use   Vaping Use: Never used  Substance Use Topics   Alcohol use: Yes    Comment: couple of beers a week   Drug use: No     Family Hx: The patient's family history includes Hypertension in an other family member.  ROS:   Please see the history of present illness.     All other systems reviewed and are negative.  Prior CV studies:   The following studies were reviewed today:  CT 112/05/24  IMPRESSION: 1.  Severe bi atrial enlargement   2.  No PFO/ASD   3. Chicken Wing appendage average diameter 19.8 mm depth 22 mm Suitable for a 24 mm Watchman FLX device   4.  Coronary calcium score 1208, 89 th percentile for age/sex   5.  Normal ascending thoracic aorta 3.7 cm   6.  Normal PV anatomy see measurements above    7.  No pericardial effusion  Labs/Other Tests and Data Reviewed:    EKG:  No ECG reviewed.  Recent Labs: 08/28/2022: Hemoglobin 13.6; Platelets 166 09/03/2022: BUN 20; Creatinine, Ser 1.38; Potassium 3.6; Sodium 136   Recent Lipid Panel No results found for: "CHOL", "TRIG", "HDL", "CHOLHDL", "LDLCALC", "LDLDIRECT"  Wt Readings from Last 3 Encounters:  09/23/22 190 lb (86.2 kg)  09/07/22 192 lb 9.6 oz (87.4 kg)  09/03/22 195 lb (88.5 kg)   Objective:    Vital Signs:  BP 116/77   Pulse 77   Ht 5' 11"$  (1.803 m)   Wt 190 lb (86.2 kg)   BMI 26.50 kg/m    VITAL SIGNS:  reviewed NEURO:  alert and oriented x 3, no obvious focal deficit  ASSESSMENT & PLAN:    HTN: As above, noted to be extremely hypertensive at anesthesia induction. Improved today with Toprol, hydralazine, and amlodipine. Encouraged to take Toprol at night to allow for better morning coverage. Will follow 2/28  PAF: Seen by Dr. LQuentin Orefor LFinleyclosure scheduled for 09/03/22. Unfortunately he was found to be extremely hypertensive after anesthesia induction with SBP>200 and the case was aborted. Home Toprol XL 1063mwas restarted and Amlodipine and Hydralazine were added to his regimen. Now that BP is more stable, will plan to reschedule LAAO 3/29. He will see me once again 2/28 for pre Watchman visit.   Time:   Today, I have spent 10 minutes with the patient with telehealth technology discussing the above problems.     Medication Adjustments/Labs and Tests Ordered: Current medicines are reviewed at length with the patient today.  Concerns regarding medicines are outlined above.   Tests Ordered: No orders of the defined types were placed in this encounter.   Medication Changes: No orders of the defined types were placed in this encounter.   Follow Up:  In Person  2/28  Signed, JiKathyrn DrownNP  09/23/2022 3:23 PM    [place]

## 2022-09-30 ENCOUNTER — Other Ambulatory Visit: Payer: Self-pay

## 2022-09-30 DIAGNOSIS — I480 Paroxysmal atrial fibrillation: Secondary | ICD-10-CM

## 2022-10-06 DIAGNOSIS — E7849 Other hyperlipidemia: Secondary | ICD-10-CM | POA: Diagnosis not present

## 2022-10-06 DIAGNOSIS — Z125 Encounter for screening for malignant neoplasm of prostate: Secondary | ICD-10-CM | POA: Diagnosis not present

## 2022-10-06 DIAGNOSIS — I700 Atherosclerosis of aorta: Secondary | ICD-10-CM | POA: Diagnosis not present

## 2022-10-06 DIAGNOSIS — R7989 Other specified abnormal findings of blood chemistry: Secondary | ICD-10-CM | POA: Diagnosis not present

## 2022-10-06 NOTE — Progress Notes (Unsigned)
[facility]                                     Cardiology Office Note:    Date:  10/08/2022   ID:  Justin [name], DOB 1951/03/15, MRN [id number]  PCP:  Sueanne Margarita, DO  [place] Cardiologist:  Pixie Casino, MD  [facility] Electrophysiologist:  Vickie Epley, MD   Referring MD: Sueanne Margarita, DO   Chief Complaint  Patient presents with   Follow-up    Pre LAAO    History of Present Illness:    Justin [name] is a 72 y.o. male with a hx of spondylitis, gout, HTN, RBBB, persistent afib (failed DCCV in 12/2021) who is being seen today for pre LAAO evaluation.   Justin [name] was referred to Dr. Debara Pickett in 12/2021 for evaluation of new onset atrial fibrillation. He reported fatigue and some shortness of breath. His metoprolol was increased and he was given samples of Xarelto given issues with cost. He also noted issues with excessive bruising. Echo 05/05/22 showed EF 65-70%, severe LAE, mild thickening of AoV and mild aortic dilation. He underwent DCCV 12/24/2021 with 4-5 beats of NSR before returning to atrial fibrillation.    He was then referred to Dr. Quentin Ore for consideration of [place]. Cardiac CT 06/22/22 showed severe biatrial enlargement with a chicken wing appendage average diameter 19.8 mm depth 22 mm suitable for a 24 mm Watchman FLX device as well as an elevated coronary calcium score. LAAO closure was scheduled on 09/03/22.    Unfortunately he was found to be extremely hypertensive after anesthesia induction with SBP>200 and the case was aborted. Home Toprol XL '100mg'$  restarted with the addition of amlodipine and hydralazine. He was seen in follow up and continued to have elevated BP's. His hydralazine was increased with plans for telephone follow up given that HTN clinic had no close availability. On recheck, BP's had improved with plans to move forward with LAAO closure.   Today he is here alone. EKG unfortunately shows AF with RVR. He states that he  was dx with COVID 2/10 and feels clinically better however has noted his HR's have remained elevated since that time. He also is having bilateral LE edema. No chest pain, palpitations, SOB, orthopnea, dizziness, or syncope.    Past Medical History:  Diagnosis Date   Angioedema 03/28/2012   "tongue and throat"- was never known why"was taking humira at the time.   Arthritis    "hands""back"   Asthma    treated for over 24yr ago   Chronic back pain    scoliosis/spondylosis   Complication of anesthesia    may try fight when waking up   History of bronchitis 10+yrs ago   History of colon polyps    History of gout    last time many yrs ago   History of kidney stones    Joint pain    Joint swelling    Lyme disease    just completed treatment with Doxycycline    Pneumonia 08/2013   Spondylitis, ankylosing (HCC)    Weakness    and numbness in right leg    Past Surgical History:  Procedure Laterality Date   APPENDECTOMY     BILIARY DILATION  07/25/2020   Procedure: BILIARY DILATION;  Surgeon: MClarene Essex MD;  Location: [facility];  Service: Endoscopy;;   CARDIOVERSION N/A 12/24/2021  Procedure: CARDIOVERSION;  Surgeon: Geralynn Rile, MD;  Location: [facility];  Service: Cardiovascular;  Laterality: N/A;   CHOLECYSTECTOMY     COLONOSCOPY     ERCP N/A 07/25/2020   Procedure: ENDOSCOPIC RETROGRADE CHOLANGIOPANCREATOGRAPHY (ERCP);  Surgeon: Clarene Essex, MD;  Location: [facility];  Service: Endoscopy;  Laterality: N/A;   ESOPHAGOGASTRODUODENOSCOPY     knuckle surgery     right middle finger a couple of times   LUMBAR LAMINECTOMY/DECOMPRESSION MICRODISCECTOMY Right 01/11/2013   Procedure: CENTRAL DECOMPRESSION LUMBAR LAMINECTOMYL4-L5 RIGHT, MICRODISCECTOMY L4-L5 RIGHT     (1 LEVEL);  Surgeon: Tobi Bastos, MD;  Location: [facility];  Service: Orthopedics;  Laterality: Right;   REMOVAL OF STONES  07/25/2020   Procedure: REMOVAL OF STONES;  Surgeon: Clarene Essex, MD;  Location:  [facility];  Service: Endoscopy;;   SEPTOPLASTY     SHOULDER OPEN ROTATOR CUFF REPAIR Left    x 2;impingement syndrome and torn rotator cuff   SPHINCTEROTOMY  07/25/2020   Procedure: SPHINCTEROTOMY;  Surgeon: Clarene Essex, MD;  Location: [facility];  Service: Endoscopy;;   SPINAL CORD STIMULATOR INSERTION N/A 01/04/2014   Procedure: LUMBAR SPINAL CORD STIMULATOR INSERTION;  Surgeon: Melina Schools, MD;  Location: [place];  Service: Orthopedics;  Laterality: N/A;   TEE WITHOUT CARDIOVERSION N/A 09/03/2022   Procedure: TRANSESOPHAGEAL ECHOCARDIOGRAM (TEE);  Surgeon: Vickie Epley, MD;  Location: [facility];  Service: Cardiovascular;  Laterality: N/A;   TONSILLECTOMY      Current Medications: Current Meds  Medication Sig   albuterol (VENTOLIN HFA) 108 (90 Base) MCG/ACT inhaler Inhale 2 puffs into the lungs every 4 (four) hours as needed for wheezing or shortness of breath.   Ascorbic Acid (VITAMIN C PO) Take 1 tablet by mouth daily.   cholecalciferol (VITAMIN D3) 25 MCG (1000 UNIT) tablet Take 1,000 Units by mouth daily.   diltiazem (CARDIZEM CD) 240 MG 24 hr capsule Take 1 capsule (240 mg total) by mouth daily.   furosemide (LASIX) 20 MG tablet TAKE 40 MG (2 TABLETS) DAILY FOR 3 DAYS, THEN DECREASE TO 20 MG (1 TABLET) DAILY.   hydrALAZINE (APRESOLINE) 50 MG tablet Take 1 tablet (50 mg total) by mouth 3 (three) times daily.   ibuprofen (ADVIL) 200 MG tablet Take by mouth as needed for mild pain or moderate pain.   metoprolol succinate (TOPROL-XL) 100 MG 24 hr tablet TAKE 1 TABLET BY MOUTH EVERY DAY   Multiple Vitamins-Minerals (MULTIVITAMIN WITH MINERALS) tablet Take 1 tablet by mouth daily.   omeprazole (PRILOSEC) 40 MG capsule Take 40 mg by mouth daily.   OVER THE COUNTER MEDICATION Apply 1 application  topically daily. CBD Oil   OXYCONTIN 30 MG 12 hr tablet Take 30 mg by mouth every 12 (twelve) hours.   rivaroxaban (XARELTO) 20 MG TABS tablet Take 20 mg by mouth at bedtime.    tiZANidine (ZANAFLEX) 4 MG tablet Take 4 mg by mouth in the morning and at bedtime.   [DISCONTINUED] amLODipine (NORVASC) 10 MG tablet Take 1 tablet (10 mg total) by mouth daily.     Allergies:   Indocin [indomethacin] and Norco [hydrocodone-acetaminophen]   Social History   Socioeconomic History   Marital status: Married    Spouse name: Not on file   Number of children: Not on file   Years of education: Not on file   Highest education level: Not on file  Occupational History   Not on file  Tobacco Use   Smoking status: Former  Types: Cigars   Smokeless tobacco: Never   Tobacco comments:    quit smoking cigarettes 10-32yr ago  Vaping Use   Vaping Use: Never used  Substance and Sexual Activity   Alcohol use: Yes    Comment: couple of beers a week   Drug use: No   Sexual activity: Yes  Other Topics Concern   Not on file  Social History Narrative   Not on file   Social Determinants of Health   Financial Resource Strain: Not on file  Food Insecurity: Not on file  Transportation Needs: Not on file  Physical Activity: Not on file  Stress: Not on file  Social Connections: Not on file    Family History: The patient's family history includes Hypertension in an other family member.  ROS:   Please see the history of present illness.    All other systems reviewed and are negative.  EKGs/Labs/Other Studies Reviewed:    The following studies were reviewed today:   CT 06/23/23:   IMPRESSION: 1.  Severe bi atrial enlargement   2.  No PFO/ASD   3. Chicken Wing appendage average diameter 19.8 mm depth 22 mm Suitable for a 24 mm Watchman FLX device   4.  Coronary calcium score 1208, 89 th percentile for age/sex   5.  Normal ascending thoracic aorta 3.7 cm   6.  Normal PV anatomy see measurements above   7.  No pericardial effusion   EKG:  EKG is ordered today.  The ekg ordered today demonstrates AF with RVR  Recent Labs: 10/07/2022: BUN 24; Creatinine, Ser  1.54; Hemoglobin 13.8; Platelets 211; Potassium 4.5; Sodium 137  Recent Lipid Panel No results found for: "CHOL", "TRIG", "HDL", "CHOLHDL", "VLDL", "LDLCALC", "LDLDIRECT"   Physical Exam:    VS:  BP (!) 145/87   Pulse (!) 123   Ht '5\' 11"'$  (1.803 m)   Wt 194 lb (88 kg)   SpO2 99%   BMI 27.06 kg/m     Wt Readings from Last 3 Encounters:  10/07/22 194 lb (88 kg)  09/23/22 190 lb (86.2 kg)  09/07/22 192 lb 9.6 oz (87.4 kg)    General: Well developed, well nourished, NAD Lungs:Clear to ausculation bilaterally. No wheezes, rales, or rhonchi. Breathing is unlabored. Cardiovascular: Irregularly irregular. Extremities: 1+ BLE edema. Neuro: Alert and oriented. No focal deficits. No facial asymmetry. MAE spontaneously. Psych: Responds to questions appropriately with normal affect.    ASSESSMENT/PLAN:    Atrial fibrillation with RVR: Seen for pre LAAO evaluation. EKG shows AF with RVR today. Reports recent COVD 2/10. Rates have been elevated since that time. Having bilateral LE edema. Initial plan was for rate control and OP DCCV at first available slot however after discussion with Dr. LQuentin Ore will have the patient return next week for discussion of possible antiarrythmic therapy. We will cancel his LAAO closure until his AF is more controlled. Start Lasix '40mg'$  QD x3 days then reduce to '20mg'$  QD thereafter. Check BMET at follow up with Dr. LQuentin Ore   HTN: Improved today with current regimen. Continue for now.   Medication Adjustments/Labs and Tests Ordered: Current medicines are reviewed at length with the patient today.  Concerns regarding medicines are outlined above.  Orders Placed This Encounter  Procedures   Basic metabolic panel   CBC   EKG 12-Lead   Meds ordered this encounter  Medications   diltiazem (CARDIZEM CD) 240 MG 24 hr capsule    Sig: Take 1 capsule (240 mg total) by  mouth daily.    Dispense:  90 capsule    Refill:  3   furosemide (LASIX) 20 MG tablet    Sig: TAKE  40 MG (2 TABLETS) DAILY FOR 3 DAYS, THEN DECREASE TO 20 MG (1 TABLET) DAILY.    Dispense:  90 tablet    Refill:  3    Patient Instructions  Medication Instructions:  Your physician has recommended you make the following change in your medication:  STOP AMLODIPINE START DILTIAZEM 240 MG DAILY  START LASIX 40 MG FOR 3 DAYS AND THEN TO 20 MG DAILY     *If you need a refill on your cardiac medications before your next appointment, please call your pharmacy*   Lab Work: TODAY; BMET, CBC If you have labs (blood work) drawn today and your tests are completely normal, you will receive your results only by: [place] (if you have MyChart) OR A paper copy in the mail If you have any lab test that is abnormal or we need to change your treatment, we will call you to review the results.   Testing/Procedures:    Dear Justin [name]  You are scheduled for a Cardioversion on Friday, March 15 with Dr. Stanford Breed.  Please arrive at the [facility] (Main Entrance A) at [facility]: [address] [place], [place] [address] at 8:30 AM.   DIET:  Nothing to eat or drink after midnight except a sip of water with medications (see medication instructions below)  MEDICATION INSTRUCTIONS:       Continue taking your anticoagulant (blood thinner): Rivaroxaban (Xarelto).  You will need to continue this after your procedure until you are told by your provider that it is safe to stop.    FYI:  For your safety, and to allow [place] to monitor your vital signs accurately during the surgery/procedure we request: If you have artificial nails, gel coating, SNS etc, please have those removed prior to your surgery/procedure. Not having the nail coverings /polish removed may result in cancellation or delay of your surgery/procedure.  You must have a responsible person to drive you home and stay in the waiting area during your procedure. Failure to do so could result in cancellation.  Bring your insurance  cards.  *Special Note: Every effort is made to have your procedure done on time. Occasionally there are emergencies that occur at the hospital that may cause delays. Please be patient if a delay does occur.    Follow-Up: At [facility], you and your health needs are our priority.  As part of our continuing mission to provide you with exceptional heart care, we have created designated Provider Care Teams.  These Care Teams include your primary Cardiologist (physician) and Advanced Practice Providers (APPs -  Physician Assistants and Nurse Practitioners) who all work together to provide you with the care you need, when you need it.  We recommend signing up for the patient portal called "MyChart".  Sign up information is provided on this After Visit Summary.  MyChart is used to connect with patients for Virtual Visits (Telemedicine).  Patients are able to view lab/test results, encounter notes, upcoming appointments, etc.  Non-urgent messages can be sent to your provider as well.   To learn more about what you can do with MyChart, go to [email or web address].    Your next appointment:   POST-PROCEDURE   Signed, [name], NP  10/08/2022 10:20 AM    [place]

## 2022-10-07 ENCOUNTER — Telehealth: Payer: Self-pay

## 2022-10-07 ENCOUNTER — Ambulatory Visit: Payer: Medicare Other | Attending: Cardiology | Admitting: Cardiology

## 2022-10-07 VITALS — BP 145/87 | HR 123 | Ht 71.0 in | Wt 194.0 lb

## 2022-10-07 DIAGNOSIS — I4891 Unspecified atrial fibrillation: Secondary | ICD-10-CM

## 2022-10-07 DIAGNOSIS — I10 Essential (primary) hypertension: Secondary | ICD-10-CM | POA: Diagnosis not present

## 2022-10-07 DIAGNOSIS — Z9189 Other specified personal risk factors, not elsewhere classified: Secondary | ICD-10-CM

## 2022-10-07 DIAGNOSIS — N179 Acute kidney failure, unspecified: Secondary | ICD-10-CM | POA: Diagnosis not present

## 2022-10-07 MED ORDER — DILTIAZEM HCL ER COATED BEADS 240 MG PO CP24: 240.0000 mg | ORAL_CAPSULE | Freq: Every day | ORAL | 3 refills | Status: DC

## 2022-10-07 MED ORDER — FUROSEMIDE 20 MG PO TABS: ORAL_TABLET | ORAL | 3 refills | Status: DC

## 2022-10-07 NOTE — Telephone Encounter (Signed)
Discussed the patient with Dr. Quentin Ore and Sharee Pimple. Per Dr. Quentin Ore, the patient's HF and heart rate will need to be more controlled prior to complicating matters with LAAO. Called the schedule the patient for follow-up with Dr. Quentin Ore to discuss rhythm control.  Left message to call back.

## 2022-10-07 NOTE — Patient Instructions (Signed)
Medication Instructions:  Your physician has recommended you make the following change in your medication:  STOP AMLODIPINE START DILTIAZEM 240 MG DAILY  START LASIX 40 MG FOR 3 DAYS AND THEN TO 20 MG DAILY     *If you need a refill on your cardiac medications before your next appointment, please call your pharmacy*   Lab Work: TODAY; BMET, CBC If you have labs (blood work) drawn today and your tests are completely normal, you will receive your results only by: [place] (if you have MyChart) OR A paper copy in the mail If you have any lab test that is abnormal or we need to change your treatment, we will call you to review the results.   Testing/Procedures:    Dear Justin [name]  You are scheduled for a Cardioversion on Friday, March 15 with Dr. Stanford Breed.  Please arrive at the [facility] (Main Entrance A) at [facility]: [address] [name], [name] at 8:30 AM.   DIET:  Nothing to eat or drink after midnight except a sip of water with medications (see medication instructions below)  MEDICATION INSTRUCTIONS:       Continue taking your anticoagulant (blood thinner): Rivaroxaban (Xarelto).  You will need to continue this after your procedure until you are told by your provider that it is safe to stop.    FYI:  For your safety, and to allow [place] to monitor your vital signs accurately during the surgery/procedure we request: If you have artificial nails, gel coating, SNS etc, please have those removed prior to your surgery/procedure. Not having the nail coverings /polish removed may result in cancellation or delay of your surgery/procedure.  You must have a responsible person to drive you home and stay in the waiting area during your procedure. Failure to do so could result in cancellation.  Bring your insurance cards.  *Special Note: Every effort is made to have your procedure done on time. Occasionally there are emergencies that occur at the hospital  that may cause delays. Please be patient if a delay does occur.    Follow-Up: At [facility], you and your health needs are our priority.  As part of our continuing mission to provide you with exceptional heart care, we have created designated Provider Care Teams.  These Care Teams include your primary Cardiologist (physician) and Advanced Practice Providers (APPs -  Physician Assistants and Nurse Practitioners) who all work together to provide you with the care you need, when you need it.  We recommend signing up for the patient portal called "MyChart".  Sign up information is provided on this After Visit Summary.  MyChart is used to connect with patients for Virtual Visits (Telemedicine).  Patients are able to view lab/test results, encounter notes, upcoming appointments, etc.  Non-urgent messages can be sent to your provider as well.   To learn more about what you can do with MyChart, go to [email or web address].    Your next appointment:   POST-PROCEDURE

## 2022-10-08 ENCOUNTER — Telehealth: Payer: Self-pay | Admitting: Cardiology

## 2022-10-08 LAB — CBC
Hematocrit: 39.7 % (ref 37.5–51.0)
Hemoglobin: 13.8 g/dL (ref 13.0–17.7)
MCH: 33.3 pg — ABNORMAL HIGH (ref 26.6–33.0)
MCHC: 34.8 g/dL (ref 31.5–35.7)
MCV: 96 fL (ref 79–97)
Platelets: 211 10*3/uL (ref 150–450)
RBC: 4.14 x10E6/uL (ref 4.14–5.80)
RDW: 12 % (ref 11.6–15.4)
WBC: 5.4 10*3/uL (ref 3.4–10.8)

## 2022-10-08 LAB — BASIC METABOLIC PANEL
BUN/Creatinine Ratio: 16 (ref 10–24)
BUN: 24 mg/dL (ref 8–27)
CO2: 20 mmol/L (ref 20–29)
Calcium: 10.4 mg/dL — ABNORMAL HIGH (ref 8.6–10.2)
Chloride: 95 mmol/L — ABNORMAL LOW (ref 96–106)
Creatinine, Ser: 1.54 mg/dL — ABNORMAL HIGH (ref 0.76–1.27)
Glucose: 99 mg/dL (ref 70–99)
Potassium: 4.5 mmol/L (ref 3.5–5.2)
Sodium: 137 mmol/L (ref 134–144)
eGFR: 49 mL/min/{1.73_m2} — ABNORMAL LOW (ref >59)

## 2022-10-08 NOTE — Telephone Encounter (Signed)
Spoke with Mr. Thew regarding the plan to see him back in clinic with Dr. Quentin Ore scheduled 10/20/22 at [place]. He agrees with this plan. All questions answered.   [name] NP-C Structural Heart Team  Pager: [phone] Phone: [phone]

## 2022-10-08 NOTE — Telephone Encounter (Signed)
See 10/08/2022 phone note. [name] spoke with the patient and relayed plan.

## 2022-10-13 ENCOUNTER — Other Ambulatory Visit: Payer: Self-pay | Admitting: Internal Medicine

## 2022-10-13 ENCOUNTER — Ambulatory Visit: Payer: Medicare Other | Admitting: Cardiology

## 2022-10-13 DIAGNOSIS — D649 Anemia, unspecified: Secondary | ICD-10-CM | POA: Diagnosis not present

## 2022-10-13 DIAGNOSIS — M459 Ankylosing spondylitis of unspecified sites in spine: Secondary | ICD-10-CM | POA: Diagnosis not present

## 2022-10-13 DIAGNOSIS — M609 Myositis, unspecified: Secondary | ICD-10-CM | POA: Diagnosis not present

## 2022-10-13 DIAGNOSIS — D510 Vitamin B12 deficiency anemia due to intrinsic factor deficiency: Secondary | ICD-10-CM | POA: Diagnosis not present

## 2022-10-13 DIAGNOSIS — Z0000 Encounter for general adult medical examination without abnormal findings: Secondary | ICD-10-CM | POA: Diagnosis not present

## 2022-10-13 DIAGNOSIS — J449 Chronic obstructive pulmonary disease, unspecified: Secondary | ICD-10-CM | POA: Diagnosis not present

## 2022-10-13 DIAGNOSIS — Z23 Encounter for immunization: Secondary | ICD-10-CM | POA: Diagnosis not present

## 2022-10-13 DIAGNOSIS — Z1389 Encounter for screening for other disorder: Secondary | ICD-10-CM | POA: Diagnosis not present

## 2022-10-13 DIAGNOSIS — Z1331 Encounter for screening for depression: Secondary | ICD-10-CM | POA: Diagnosis not present

## 2022-10-13 DIAGNOSIS — I700 Atherosclerosis of aorta: Secondary | ICD-10-CM | POA: Diagnosis not present

## 2022-10-16 ENCOUNTER — Ambulatory Visit: Payer: Medicare Other | Attending: Internal Medicine | Admitting: Pharmacist

## 2022-10-16 ENCOUNTER — Encounter: Payer: Self-pay | Admitting: Pharmacist

## 2022-10-16 VITALS — BP 130/72 | HR 63 | Wt 202.0 lb

## 2022-10-16 DIAGNOSIS — R931 Abnormal findings on diagnostic imaging of heart and coronary circulation: Secondary | ICD-10-CM | POA: Diagnosis not present

## 2022-10-16 DIAGNOSIS — I10 Essential (primary) hypertension: Secondary | ICD-10-CM

## 2022-10-16 NOTE — Progress Notes (Signed)
Patient ID: DEMONTRAE Justin [name]                 DOB: 06/17/1951                      MRN: [id number]     HPI: Justin [name] is a 72 y.o. male patient of Dr Debara Pickett and Dr Quentin Ore referred by Justin Drown, NP to HTN clinic. PMH is significant for HTN, RBBB, persistent afib (failed DCCV in 12/2021), elevated calcium score of 1208 (89th percentile for age and sex) on 06/2022 coronary CTA, gout, and ankylosing spondylitis. Was pending LAAO however was hypertensive with SBP > 200 and case was aborted. Home Toprol was resumed with the addition of amlodipine and hydralazine. Reported bilateral LE edema at [facility] f/u on 2/28, BP was elevated at 145/87 and HR was 123. Pt was started on Lasix '40mg'$  x 3 days, then '20mg'$  daily. His amlodipine was also stopped and he was started on diltiazem '240mg'$  daily for afib. Pt scheduled with Dr Quentin Ore on 2/13 as HR will need to be controlled prior to [place].  Pt presents today for follow up. Denies dizziness, headache, or SOB out of the ordinary. Swelling has improved a bit but still present today. Weight is also up 8 lbs in the past 9 days. Doesn't add much salt to his food. Hasn't been checking weight at home. Took extra doses of Lasix as instructed. Reports swelling didn't start until the day before he saw [place] on 2/28. Brings in detailed log of BP readings, has bicep cuff at home, hasn't brought it to office to confirm accuracy before. Checks BP in the AM and PM before taking meds. HR much better at home, still sounds like he's in afib based on BP check today.  2/29 - 117/77, 83; 105/61, 123; 107/87, 110 3/1 - 121/81, 84; 128/80, 80; 118/73, 84 3/2 - 106/74, 84; 104/70, 105; 127/65, 61 3/3 - 127/79, 73; 143/67, 96; 107/63, 71 3/4 - 135/75, 73; 117/77, 83 3/5 - 119/71, 75; 115/64, 73 3/6 - 156/84, 105; 128/64, 12  Mentions his PCP talked about starting a statin but he declined. His CAC is > 1,000, he would definitely benefit from statin therapy which I mentioned today but he  declined.  Current HTN meds:  hydralazine '50mg'$  TID (10am, 12-1pm, 6-7pm) Toprol '100mg'$  daily (PM) diltiazem '240mg'$  daily (AM) Lasix '20mg'$  daily (AM)  BP goal: <130/8mHg  Family History: The patient's family history includes Hypertension in an other family member.   Social History: Former cigar use, occasional alcohol use.  Diet: no breakfast, light lunch - salad or sandwich, dinner - meat and fresh vegetables. Doesn't use much salt, drinks water mainly, no coffee or soda.  Exercise: Walks at least 30 mins every other day  Home BP readings:   Wt Readings from Last 3 Encounters:  10/07/22 194 lb (88 kg)  09/23/22 190 lb (86.2 kg)  09/07/22 192 lb 9.6 oz (87.4 kg)   BP Readings from Last 3 Encounters:  10/07/22 (!) 145/87  09/23/22 116/77  09/07/22 (!) 190/80   Pulse Readings from Last 3 Encounters:  10/07/22 (!) 123  09/23/22 77  09/07/22 84    Renal function: Estimated Creatinine Clearance: 48.2 mL/min (A) (by C-G formula based on SCr of 1.54 mg/dL (H)).  Past Medical History:  Diagnosis Date   Angioedema 03/28/2012   "tongue and throat"- was never known why"was taking humira at the time.   Arthritis    "  hands""back"   Asthma    treated for over 63yr ago   Chronic back pain    scoliosis/spondylosis   Complication of anesthesia    may try fight when waking up   History of bronchitis 10+yrs ago   History of colon polyps    History of gout    last time many yrs ago   History of kidney stones    Joint pain    Joint swelling    Lyme disease    just completed treatment with Doxycycline    Pneumonia 08/2013   Spondylitis, ankylosing (HCC)    Weakness    and numbness in right leg    Current Outpatient Medications on File Prior to Visit  Medication Sig Dispense Refill   albuterol (VENTOLIN HFA) 108 (90 Base) MCG/ACT inhaler Inhale 2 puffs into the lungs every 4 (four) hours as needed for wheezing or shortness of breath.     Ascorbic Acid (VITAMIN C PO) Take 1  tablet by mouth daily.     cholecalciferol (VITAMIN D3) 25 MCG (1000 UNIT) tablet Take 1,000 Units by mouth daily.     diltiazem (CARDIZEM CD) 240 MG 24 hr capsule Take 1 capsule (240 mg total) by mouth daily. 90 capsule 3   furosemide (LASIX) 20 MG tablet TAKE 40 MG (2 TABLETS) DAILY FOR 3 DAYS, THEN DECREASE TO 20 MG (1 TABLET) DAILY. 90 tablet 3   hydrALAZINE (APRESOLINE) 50 MG tablet Take 1 tablet (50 mg total) by mouth 3 (three) times daily. 270 tablet 3   ibuprofen (ADVIL) 200 MG tablet Take by mouth as needed for mild pain or moderate pain.     metoprolol succinate (TOPROL-XL) 100 MG 24 hr tablet TAKE 1 TABLET BY MOUTH EVERY DAY 90 tablet 2   Multiple Vitamins-Minerals (MULTIVITAMIN WITH MINERALS) tablet Take 1 tablet by mouth daily.     omeprazole (PRILOSEC) 40 MG capsule Take 40 mg by mouth daily.     OVER THE COUNTER MEDICATION Apply 1 application  topically daily. CBD Oil     OXYCONTIN 30 MG 12 hr tablet Take 30 mg by mouth every 12 (twelve) hours.     rivaroxaban (XARELTO) 20 MG TABS tablet Take 20 mg by mouth at bedtime.     tiZANidine (ZANAFLEX) 4 MG tablet Take 4 mg by mouth in the morning and at bedtime.     No current facility-administered medications on file prior to visit.    Allergies  Allergen Reactions   Indocin [Indomethacin] Other (See Comments)    Severe headaches.    Norco [Hydrocodone-Acetaminophen] Nausea And Vomiting     Assessment/Plan:  1. Hypertension - BP much improved today in clinic and at home, consistently at goal < 130/859mg. HR also improved to the 60s. His weight is up 8 lbs in the past 9 days however. Advised him to take an extra Lasix for the next 3 days. Sees Dr LaQuentin Oreext Tuesday and will reassess weight at that time. No SOB out of the ordinary but some swelling still noted in his ankles. Checking BMET today with Lasix start last week. Advised him to shift hydralazine times to every 8 hours apart for better efficacy. He used to take losartan  in the past which would also provide renal protection given his CKD. He doesn't recall why this was stopped but doesn't want to change meds now since his BP is adequately controlled. Will continue current meds - Toprol '100mg'$  daily, diltiazem '240mg'$  daily, hydralazine '50mg'$  TID, and Lasix  $'20mg'S$  daily (after he doubles dose for next 3 days).  2. CAD - Calcium score > 1,200, pt would benefit from statin therapy. His PCP discussed this recently with him but pt declined. Discussed CV benefit in starting statin given his elevated calcium score. Most recent lipids I see from [place] are LDL of 90 from 2022.  Pt declines statin today.  F/u with PharmD as needed.  Lisel Siegrist E. Man Effertz, PharmD, BCACP, [facility]. [address], [place], [place] [address] Phone: [phone]; Fax: [phone] 10/16/2022 11:11 AM

## 2022-10-16 NOTE — Patient Instructions (Addendum)
Your blood pressure is excellent and at goal < 130/89mHg  Take 2 tablets of Lasix ('40mg'$  total) for the next 3 days, then go back to 1 tablet daily  Move your hydralazine doses to 10am, 5pm, and midnight   Continue taking your medications and keep your follow up appt with Dr LQuentin Orenext Tuesday

## 2022-10-17 LAB — BASIC METABOLIC PANEL
BUN/Creatinine Ratio: 15 (ref 10–24)
BUN: 33 mg/dL — ABNORMAL HIGH (ref 8–27)
CO2: 22 mmol/L (ref 20–29)
Calcium: 9.4 mg/dL (ref 8.6–10.2)
Chloride: 101 mmol/L (ref 96–106)
Creatinine, Ser: 2.26 mg/dL — ABNORMAL HIGH (ref 0.76–1.27)
Glucose: 98 mg/dL (ref 70–99)
Potassium: 4.4 mmol/L (ref 3.5–5.2)
Sodium: 141 mmol/L (ref 134–144)
eGFR: 31 mL/min/{1.73_m2} — ABNORMAL LOW (ref >59)

## 2022-10-19 NOTE — Progress Notes (Unsigned)
  Electrophysiology Office Follow up Visit Note:    Date:  10/20/2022   ID:  Justin [name], DOB 05/10/1951, MRN [id number]  PCP:  Skakle, [place], DO  [facility] Cardiologist:  Kenneth C Hilty, MD  [facility] Electrophysiologist:  Naman Spychalski T Matrice Herro, MD    Interval History:    Justin [name] is a 71 y.o. male who presents for a follow up visit.   I last saw the patient June 03, 2022 for his persistent atrial fibrillation.  We planned for left atrial appendage occlusion.  In the interim, the patient met with Jill McDaniel in clinic.  At that appointment he had signs and symptoms of decompensated heart failure thought to be secondary to poorly controlled atrial fibrillation ventricular rates.  His Watchman procedure was postponed.  He was diuresed.  He then saw Megan supple on October 16, 2022.  At that appointment he reported weight gain of at least 8 pounds over the last 9 days.  His heart rates were seemingly better controlled based on home reports.  A metabolic panel was checked.  Since the appointment with Megan, the patient has been doing better with improved volume status.  He has no swelling in his legs now.  He is back down to once a day Lasix.       Past medical, surgical, social and family history were reviewed.  ROS:   Please see the history of present illness.    All other systems reviewed and are negative.  EKGs/Labs/Other Studies Reviewed:    The following studies were reviewed today:      Physical Exam:    VS:  BP (!) 140/88   Pulse 69   Ht 5' 11" (1.803 m)   Wt 198 lb (89.8 kg)   SpO2 100%   BMI 27.62 kg/m     Wt Readings from Last 3 Encounters:  10/20/22 198 lb (89.8 kg)  10/16/22 202 lb (91.6 kg)  10/07/22 194 lb (88 kg)     GEN:  Well nourished, well developed in no acute distress CARDIAC: Irregularly irregular, no murmurs, rubs, gallops RESPIRATORY:  Clear to auscultation without rales, wheezing or rhonchi       ASSESSMENT:     1. Persistent atrial fibrillation (HCC)   2. Chronic diastolic heart failure (HCC)    PLAN:    In order of problems listed above:   #Persistent atrial fibrillation Likely contributing to decompensated heart failure. I have previously met with the patient to discuss watchman but this was postponed due to decompensated heart failure For now, continue Xarelto. I would like to start him on amiodarone.  400 mg by mouth twice daily for 5 days followed by 400 mg by mouth once daily for 5 days followed by 200 mg by mouth daily thereafter.  Will check a CMP, TSH and free T4 today.  He is scheduled for cardioversion later this week.  I will plan to see him back in 6 to 8 weeks.  If his volume status is stable at that appointment, can revisit watchman timing.  #Acute on chronic diastolic heart failure EF 50 to 55% NYHA class II.  Warm and more euvolemic on exam. Continue Lasix at once a day dosing Continue hydralazine  Follow-up in 6 to 8 weeks with me.       Signed, Ena Demary, MD, FACC, FHRS 10/20/2022 8:29 AM    Electrophysiology [facility] 

## 2022-10-19 NOTE — H&P (View-Only) (Signed)
  Electrophysiology Office Follow up Visit Note:    Date:  10/20/2022   ID:  Justin [name], DOB [birth year], MRN [id number]  PCP:  Sueanne Margarita, [place] Cardiologist:  Pixie Casino, MD  [facility] Electrophysiologist:  Vickie Epley, MD    Interval History:    Justin [name] is a 72 y.o. male who presents for a follow up visit.   I last saw the patient June 03, 2022 for his persistent atrial fibrillation.  We planned for left atrial appendage occlusion.  In the interim, the patient met with [name] in clinic.  At that appointment he had signs and symptoms of decompensated heart failure thought to be secondary to poorly controlled atrial fibrillation ventricular rates.  His Watchman procedure was postponed.  He was diuresed.  He then saw Megan supple on October 16, 2022.  At that appointment he reported weight gain of at least 8 pounds over the last 9 days.  His heart rates were seemingly better controlled based on home reports.  A metabolic panel was checked.  Since the appointment with [facility], the patient has been doing better with improved volume status.  He has no swelling in his legs now.  He is back down to once a day Lasix.       Past medical, surgical, social and family history were reviewed.  ROS:   Please see the history of present illness.    All other systems reviewed and are negative.  EKGs/Labs/Other Studies Reviewed:    The following studies were reviewed today:      Physical Exam:    VS:  BP (!) 140/88   Pulse 69   Ht 5\' 11"  (1.803 m)   Wt 198 lb (89.8 kg)   SpO2 100%   BMI 27.62 kg/m     Wt Readings from Last 3 Encounters:  10/20/22 198 lb (89.8 kg)  10/16/22 202 lb (91.6 kg)  10/07/22 194 lb (88 kg)     GEN:  Well nourished, well developed in no acute distress CARDIAC: Irregularly irregular, no murmurs, rubs, gallops RESPIRATORY:  Clear to auscultation without rales, wheezing or rhonchi       ASSESSMENT:     1. Persistent atrial fibrillation ([place])   2. Chronic diastolic heart failure (HCC)    PLAN:    In order of problems listed above:   #Persistent atrial fibrillation Likely contributing to decompensated heart failure. I have previously met with the patient to discuss watchman but this was postponed due to decompensated heart failure For now, continue Xarelto. I would like to start him on amiodarone.  400 mg by mouth twice daily for 5 days followed by 400 mg by mouth once daily for 5 days followed by 200 mg by mouth daily thereafter.  Will check a CMP, TSH and free T4 today.  He is scheduled for cardioversion later this week.  I will plan to see him back in 6 to 8 weeks.  If his volume status is stable at that appointment, can revisit watchman timing.  #Acute on chronic diastolic heart failure EF 50 to 55% NYHA class II.  Warm and more euvolemic on exam. Continue Lasix at once a day dosing Continue hydralazine  Follow-up in 6 to 8 weeks with me.       Signed, Lars Mage, MD, [facility], [facility] 10/20/2022 8:29 AM    Electrophysiology [facility]

## 2022-10-20 ENCOUNTER — Encounter: Payer: Self-pay | Admitting: Cardiology

## 2022-10-20 ENCOUNTER — Ambulatory Visit: Payer: Medicare Other | Attending: Cardiology | Admitting: Cardiology

## 2022-10-20 VITALS — BP 140/88 | HR 69 | Ht 71.0 in | Wt 198.0 lb

## 2022-10-20 DIAGNOSIS — I5032 Chronic diastolic (congestive) heart failure: Secondary | ICD-10-CM

## 2022-10-20 DIAGNOSIS — I4819 Other persistent atrial fibrillation: Secondary | ICD-10-CM | POA: Diagnosis not present

## 2022-10-20 LAB — COMPREHENSIVE METABOLIC PANEL
ALT: 26 IU/L (ref 0–44)
AST: 35 IU/L (ref 0–40)
Albumin/Globulin Ratio: 1.6 (ref 1.2–2.2)
Albumin: 4.3 g/dL (ref 3.9–4.9)
Alkaline Phosphatase: 112 IU/L (ref 44–121)
BUN/Creatinine Ratio: 12 (ref 10–24)
BUN: 31 mg/dL — ABNORMAL HIGH (ref 8–27)
Bilirubin Total: 0.4 mg/dL (ref 0.0–1.2)
CO2: 27 mmol/L (ref 20–29)
Calcium: 9.4 mg/dL (ref 8.6–10.2)
Chloride: 95 mmol/L — ABNORMAL LOW (ref 96–106)
Creatinine, Ser: 2.49 mg/dL — ABNORMAL HIGH (ref 0.76–1.27)
Globulin, Total: 2.7 g/dL (ref 1.5–4.5)
Glucose: 100 mg/dL — ABNORMAL HIGH (ref 70–99)
Potassium: 4.6 mmol/L (ref 3.5–5.2)
Sodium: 137 mmol/L (ref 134–144)
Total Protein: 7 g/dL (ref 6.0–8.5)
eGFR: 27 mL/min/{1.73_m2} — ABNORMAL LOW (ref >59)

## 2022-10-20 LAB — TSH: TSH: 2.66 u[IU]/mL (ref 0.450–4.500)

## 2022-10-20 LAB — T4, FREE: Free T4: 1.51 ng/dL (ref 0.82–1.77)

## 2022-10-20 MED ORDER — AMIODARONE HCL 200 MG PO TABS: 200.0000 mg | ORAL_TABLET | Freq: Every day | ORAL | 3 refills | Status: DC

## 2022-10-20 NOTE — Patient Instructions (Signed)
Medication Instructions:  Your physician has recommended you make the following change in your medication:  1) START taking amiodarone 400 mg twice daily for 5 days, then 400 mg once daily for 5 days, then 200 mg daily thereafter  *If you need a refill on your cardiac medications before your next appointment, please call your pharmacy*  Lab Work: TODAY: CMET TSH and T4  Follow-Up: At [facility], you and your health needs are our priority.  As part of our continuing mission to provide you with exceptional heart care, we have created designated Provider Care Teams.  These Care Teams include your primary Cardiologist (physician) and Advanced Practice Providers (APPs -  Physician Assistants and Nurse Practitioners) who all work together to provide you with the care you need, when you need it.  Your next appointment:   6-8 week(s)  Provider:   Lars Mage, MD

## 2022-10-22 DIAGNOSIS — G894 Chronic pain syndrome: Secondary | ICD-10-CM | POA: Diagnosis not present

## 2022-10-22 DIAGNOSIS — Z5181 Encounter for therapeutic drug level monitoring: Secondary | ICD-10-CM | POA: Diagnosis not present

## 2022-10-22 DIAGNOSIS — Z79899 Other long term (current) drug therapy: Secondary | ICD-10-CM | POA: Diagnosis not present

## 2022-10-22 DIAGNOSIS — Z79891 Long term (current) use of opiate analgesic: Secondary | ICD-10-CM | POA: Diagnosis not present

## 2022-10-23 ENCOUNTER — Ambulatory Visit (HOSPITAL_BASED_OUTPATIENT_CLINIC_OR_DEPARTMENT_OTHER): Payer: Medicare Other | Admitting: Critical Care Medicine

## 2022-10-23 ENCOUNTER — Ambulatory Visit (HOSPITAL_COMMUNITY)
Admission: RE | Admit: 2022-10-23 | Discharge: 2022-10-23 | Disposition: A | Discharge status: Home / Self Care | Payer: Medicare Other | Attending: Cardiology | Admitting: Cardiology

## 2022-10-23 ENCOUNTER — Other Ambulatory Visit: Payer: Self-pay

## 2022-10-23 ENCOUNTER — Ambulatory Visit (HOSPITAL_COMMUNITY): Payer: Medicare Other | Admitting: Critical Care Medicine

## 2022-10-23 ENCOUNTER — Encounter (HOSPITAL_COMMUNITY)
Admission: RE | Disposition: A | Discharge status: Home / Self Care | Payer: Self-pay | Source: Home / Self Care | Attending: Cardiology

## 2022-10-23 ENCOUNTER — Encounter (HOSPITAL_COMMUNITY): Payer: Self-pay | Admitting: Cardiology

## 2022-10-23 DIAGNOSIS — J45909 Unspecified asthma, uncomplicated: Secondary | ICD-10-CM | POA: Diagnosis not present

## 2022-10-23 DIAGNOSIS — I4819 Other persistent atrial fibrillation: Secondary | ICD-10-CM

## 2022-10-23 DIAGNOSIS — Z7901 Long term (current) use of anticoagulants: Secondary | ICD-10-CM | POA: Insufficient documentation

## 2022-10-23 DIAGNOSIS — I5033 Acute on chronic diastolic (congestive) heart failure: Secondary | ICD-10-CM | POA: Insufficient documentation

## 2022-10-23 DIAGNOSIS — M1990 Unspecified osteoarthritis, unspecified site: Secondary | ICD-10-CM

## 2022-10-23 DIAGNOSIS — Z79899 Other long term (current) drug therapy: Secondary | ICD-10-CM | POA: Diagnosis not present

## 2022-10-23 DIAGNOSIS — I4891 Unspecified atrial fibrillation: Secondary | ICD-10-CM | POA: Diagnosis not present

## 2022-10-23 DIAGNOSIS — Z87891 Personal history of nicotine dependence: Secondary | ICD-10-CM | POA: Diagnosis not present

## 2022-10-23 HISTORY — PX: CARDIOVERSION: SHX1299

## 2022-10-23 SURGERY — CARDIOVERSION
Anesthesia: General

## 2022-10-23 MED ORDER — SODIUM CHLORIDE 0.9 % IV SOLN: INTRAVENOUS | Status: DC

## 2022-10-23 MED ORDER — PROPOFOL 10 MG/ML IV BOLUS
INTRAVENOUS | Status: DC | PRN
  Administered 2022-10-23: 40 mg via INTRAVENOUS
  Administered 2022-10-23: 60 mg via INTRAVENOUS
  Administered 2022-10-23: 100 mg via INTRAVENOUS

## 2022-10-23 MED ORDER — LIDOCAINE 2% (20 MG/ML) 5 ML SYRINGE
INTRAMUSCULAR | Status: DC | PRN
  Administered 2022-10-23: 20 mg via INTRAVENOUS

## 2022-10-23 NOTE — Transfer of Care (Signed)
Immediate Anesthesia Transfer of Care Note  Patient: Justin [name]  Procedure(s) Performed: CARDIOVERSION  Patient Location: Endoscopy Unit  Anesthesia Type:General  Level of Consciousness: oriented  Airway & Oxygen Therapy: Patient Spontanous Breathing  Post-op Assessment: Report given to RN and Post -op Vital signs reviewed and stable  Post vital signs: Reviewed and stable  Last Vitals:  Vitals Value Taken Time  BP 109/70   Temp    Pulse 80   Resp 13   SpO2 94     Last Pain:  Vitals:   10/23/22 0851  TempSrc: Temporal  PainSc: 3          Complications: No notable events documented.

## 2022-10-23 NOTE — Anesthesia Procedure Notes (Signed)
Procedure Name: Intubation Date/Time: 10/23/2022 9:17 AM  Performed by: Wilburn Cornelia, CRNAPre-anesthesia Checklist: Patient identified, Emergency Drugs available, Suction available, Patient being monitored and Timeout performed Patient Re-evaluated:Patient Re-evaluated prior to induction Oxygen Delivery Method: Ambu bag Preoxygenation: Pre-oxygenation with 100% oxygen Induction Type: IV induction Ventilation: Mask ventilation without difficulty Placement Confirmation: breath sounds checked- equal and bilateral Dental Injury: Teeth and Oropharynx as per pre-operative assessment

## 2022-10-23 NOTE — Anesthesia Preprocedure Evaluation (Signed)
Anesthesia Evaluation  Patient identified by MRN, date of birth, ID band Patient awake    Reviewed: Allergy & Precautions, NPO status , Patient's Chart, lab work & pertinent test results  Airway Mallampati: II  TM Distance: >3 FB Neck ROM: Full    Dental no notable dental hx.    Pulmonary asthma , former smoker   Pulmonary exam normal        Cardiovascular + dysrhythmias Atrial Fibrillation  Rhythm:Irregular Rate:Normal    1. Left ventricular ejection fraction, by estimation, is 60 to 65%. The  left ventricle has normal function. The left ventricle has no regional  wall motion abnormalities. There is moderate left ventricular hypertrophy.  Left ventricular diastolic  parameters are indeterminate.   2. Right ventricular systolic function is normal. The right ventricular  size is normal.   3. Left atrial size was severely dilated.   4. The mitral valve is normal in structure. No evidence of mitral valve  regurgitation. No evidence of mitral stenosis.   5. The aortic valve is tricuspid. There is mild calcification of the  aortic valve. There is mild thickening of the aortic valve. Aortic valve  regurgitation is not visualized. Aortic valve sclerosis is present, with  no evidence of aortic valve stenosis.   6. Aortic dilatation noted. There is mild dilatation of the aortic root,  measuring 42 mm. There is borderline dilatation of the ascending aorta,  measuring 36 mm.   7. The inferior vena cava is normal in size with greater than 50%  respiratory variability, suggesting right atrial pressure of 3 mmHg.   Comparison(s): SOV 40 mm, ascending aorta 39 mm.     Neuro/Psych negative neurological ROS  negative psych ROS   GI/Hepatic ,GERD  Medicated,,  Endo/Other  negative endocrine ROS    Renal/GU negative Renal ROS  negative genitourinary   Musculoskeletal  (+) Arthritis , Osteoarthritis,    Abdominal Normal abdominal  exam  (+)   Peds  Hematology negative hematology ROS (+)   Anesthesia Other Findings   Reproductive/Obstetrics                             Anesthesia Physical Anesthesia Plan  ASA: 3  Anesthesia Plan: General   Post-op Pain Management:    Induction: Intravenous  PONV Risk Score and Plan: 2 and Treatment may vary due to age or medical condition  Airway Management Planned: Mask  Additional Equipment:   Intra-op Plan:   Post-operative Plan:   Informed Consent: I have reviewed the patients History and Physical, chart, labs and discussed the procedure including the risks, benefits and alternatives for the proposed anesthesia with the patient or authorized representative who has indicated his/her understanding and acceptance.     Dental advisory given  Plan Discussed with: CRNA  Anesthesia Plan Comments:        Anesthesia Quick Evaluation

## 2022-10-23 NOTE — Procedures (Signed)
Electrical Cardioversion Procedure Note Justin [name] [id number] [birth year]  Procedure: Electrical Cardioversion Indications:  Atrial Fibrillation  Procedure Details Consent: Risks of procedure as well as the alternatives and risks of each were explained to the (patient/caregiver).  Consent for procedure obtained. Time Out: Verified patient identification, verified procedure, site/side was marked, verified correct patient position, special equipment/implants available, medications/allergies/relevent history reviewed, required imaging and test results available.  Performed  Patient placed on cardiac monitor, pulse oximetry, supplemental oxygen as necessary.  Sedation given:  Pt sedated by anesthesia with lidocaine 20 mg and diprovan 200  mg IV. Pacer pads placed anterior and posterior chest.  Cardioverted 3 time(s).  Cardioverted at [place]. Initial attempt resulted in sinus transiently but atrial fibrillation recurred; second attempt unsuccessful; third attempt resulted in sinus but atrial fibrillation again recurred.  Evaluation Findings: Post procedure EKG shows: atrial fibrillation Complications: None Patient did tolerate procedure well.   [name] 10/23/2022, 9:09 AM

## 2022-10-23 NOTE — Anesthesia Postprocedure Evaluation (Signed)
Anesthesia Post Note  Patient: Justin [name]  Procedure(s) Performed: CARDIOVERSION     Patient location during evaluation: PACU Anesthesia Type: General Level of consciousness: awake and alert Pain management: pain level controlled Vital Signs Assessment: post-procedure vital signs reviewed and stable Respiratory status: spontaneous breathing, nonlabored ventilation, respiratory function stable and patient connected to nasal cannula oxygen Cardiovascular status: blood pressure returned to baseline and stable Postop Assessment: no apparent nausea or vomiting Anesthetic complications: no  No notable events documented.  Last Vitals:  Vitals:   10/23/22 0940 10/23/22 0950  BP: 118/67 128/75  Pulse: 63 61  Resp: 14 17  Temp:    SpO2: 93% 94%    Last Pain:  Vitals:   10/23/22 0950  TempSrc:   PainSc: 0-No pain                 Tiajuana Amass

## 2022-10-23 NOTE — Interval H&P Note (Signed)
History and Physical Interval Note:  10/23/2022 9:05 AM  Justin [name]  has presented today for surgery, with the diagnosis of afib.  The various methods of treatment have been discussed with the patient and family. After consideration of risks, benefits and other options for treatment, the patient has consented to  Procedure(s): CARDIOVERSION (N/A) as a surgical intervention.  The patient's history has been reviewed, patient examined, no change in status, stable for surgery.  I have reviewed the patient's chart and labs.  Questions were answered to the patient's satisfaction.     [name]

## 2022-10-23 NOTE — Discharge Instructions (Signed)

## 2022-10-23 NOTE — H&P (Signed)
Office Visit 10/20/2022 [facility] at [facility]    Farnham, Lysbeth Galas T, MD Cardiology Persistent atrial fibrillation [facility]) +1 more Dx Referred by Sueanne Margarita, DO Reason for Visit   Additional Documentation  Vitals: BP 140/88 Important    Pulse 69   Ht 5\' 11"  (1.803 m)   Wt 89.8 kg   SpO2 100%   BMI 27.62 kg/m   BSA 2.12 m  Flowsheets: NEWS,   MEWS Score,   Anthropometrics  Encounter Info: Billing Info,   History,   Allergies,   Detailed Report   All Notes   Progress Notes by Vickie Epley, MD at 10/20/2022 8:15 AM  Author: Vickie Epley, MD Author Type: Physician Filed: 10/20/2022  8:57 AM  Note Status: Signed Cosign: Cosign Not Required Encounter Date: 10/20/2022  Editor: Vickie Epley, MD (Physician)             Expand All Collapse All  Electrophysiology Office Follow up Visit Note:     Date:  10/20/2022    ID:  Justin [name], DOB [birth year], MRN [id number]   PCP:  Sueanne Margarita, [place] Cardiologist:  Pixie Casino, MD  [facility] Electrophysiologist:  Vickie Epley, MD      Interval History:     Justin [name] is a 72 y.o. male who presents for a follow up visit.    I last saw the patient June 03, 2022 for his persistent atrial fibrillation.  We planned for left atrial appendage occlusion.  In the interim, the patient met with [name] in clinic.  At that appointment he had signs and symptoms of decompensated heart failure thought to be secondary to poorly controlled atrial fibrillation ventricular rates.  His Watchman procedure was postponed.  He was diuresed.   He then saw Megan supple on October 16, 2022.  At that appointment he reported weight gain of at least 8 pounds over the last 9 days.  His heart rates were seemingly better controlled based on home reports.  A metabolic panel was checked.   Since the appointment with [facility], the patient has been doing better with improved  volume status.  He has no swelling in his legs now.  He is back down to once a day Lasix.         Objective  Past medical, surgical, social and family history were reviewed.   ROS:   Please see the history of present illness.    All other systems reviewed and are negative.   EKGs/Labs/Other Studies Reviewed:     The following studies were reviewed today:           Physical Exam:     VS:  BP (!) 140/88   Pulse 69   Ht 5\' 11"  (1.803 m)   Wt 198 lb (89.8 kg)   SpO2 100%   BMI 27.62 kg/m         Wt Readings from Last 3 Encounters:  10/20/22 198 lb (89.8 kg)  10/16/22 202 lb (91.6 kg)  10/07/22 194 lb (88 kg)      GEN:  Well nourished, well developed in no acute distress CARDIAC: Irregularly irregular, no murmurs, rubs, gallops RESPIRATORY:  Clear to auscultation without rales, wheezing or rhonchi          Assessment ASSESSMENT:     1. Persistent atrial fibrillation ([place])   2. Chronic diastolic heart failure ([place])     PLAN:  In order of problems listed above:     #Persistent atrial fibrillation Likely contributing to decompensated heart failure. I have previously met with the patient to discuss watchman but this was postponed due to decompensated heart failure For now, continue Xarelto. I would like to start him on amiodarone.  400 mg by mouth twice daily for 5 days followed by 400 mg by mouth once daily for 5 days followed by 200 mg by mouth daily thereafter.  Will check a CMP, TSH and free T4 today.  He is scheduled for cardioversion later this week.  I will plan to see him back in 6 to 8 weeks.  If his volume status is stable at that appointment, can revisit watchman timing.   #Acute on chronic diastolic heart failure EF 50 to 55% NYHA class II.  Warm and more euvolemic on exam. Continue Lasix at once a day dosing Continue hydralazine   Follow-up in 6 to 8 weeks with me.             Signed, Justin Mage, MD, [facility], [facility] 10/20/2022 8:29 AM     Electrophysiology [facility]             Patient Instructions by Bernestine Amass, RN at 10/20/2022 8:15 AM  Author: Bernestine Amass, RN Author Type: Registered Nurse Filed: 10/20/2022  8:28 AM  Note Status: Signed Cosign: Cosign Not Required Encounter Date: 10/20/2022  Editor: Bernestine Amass, RN (Registered Nurse)             Medication Instructions:  Your physician has recommended you make the following change in your medication:  1) START taking amiodarone 400 mg twice daily for 5 days, then 400 mg once daily for 5 days, then 200 mg daily thereafter   *If you need a refill on your cardiac medications before your next appointment, please call your pharmacy*   Lab Work: TODAY: CMET TSH and T4   Follow-Up: At [facility], you and your health needs are our priority.  As part of our continuing mission to provide you with exceptional heart care, we have created designated Provider Care Teams.  These Care Teams include your primary Cardiologist (physician) and Advanced Practice Providers (APPs -  Physician Assistants and Nurse Practitioners) who all work together to provide you with the care you need, when you need it.   Your next appointment:   6-8 week(s)   Provider:   Lars Mage, MD        For DCCV; compliant with xarelto; no changes. [name]

## 2022-10-24 ENCOUNTER — Encounter (HOSPITAL_COMMUNITY): Payer: Self-pay | Admitting: Cardiology

## 2022-11-01 NOTE — Progress Notes (Unsigned)
Electrophysiology Office Follow up Visit Note:    Date:  11/04/2022   ID:  Justin [name], DOB 11-30-1950, MRN [id number]  PCP:  Sueanne Margarita, [place] Cardiologist:  Pixie Casino, MD  [facility] Electrophysiologist:  Vickie Epley, MD    Interval History:    Justin [name] is a 72 y.o. male who presents for a follow up visit.   I last saw the patient October 20, 2022 for his atrial fibrillation.  He was previously scheduled for left atrial appendage occlusion but this was canceled when he presented to an appointment with [name] in atrial fibrillation with rapid ventricular rates.  He is also demonstrated heart failure symptoms recently.  The patient takes amiodarone 200 mg by mouth once daily in addition to diltiazem 240 mg by mouth once daily for rate control.  He is currently on Xarelto 20 mg by mouth once daily for stroke risk mitigation.  Today he tells me that he is doing okay.  He experiences fatigue with more than moderate exertion.  He still walks 30 to 45 minutes/day.  He has noticed about a 5 pound weight gain with fluid accumulation in his ankles and lower leg.  He does notice an increased urine output after he takes the Lasix.  He does drink a lot of fluids throughout the day.  He does weigh himself multiple times per week.     Past medical, surgical, social and family history were reviewed.  ROS:   Please see the history of present illness.    All other systems reviewed and are negative.  EKGs/Labs/Other Studies Reviewed:    The following studies were reviewed today:  EKG today shows atrial fibrillation, right bundle branch block, ventricular rate 57 bpm.   Physical Exam:    VS:  BP (!) 152/94   Pulse 60   Ht 5\' 11"  (1.803 m)   Wt 200 lb (90.7 kg)   SpO2 99%   BMI 27.89 kg/m     Wt Readings from Last 3 Encounters:  11/04/22 200 lb (90.7 kg)  10/23/22 194 lb 0.1 oz (88 kg)  10/20/22 198 lb (89.8 kg)     GEN:  Well  nourished, well developed in no acute distress CARDIAC: Irregularly irregular, no murmurs, rubs, gallops.  1+ pitting edema to the mid shins bilaterally RESPIRATORY:  Clear to auscultation without rales, wheezing or rhonchi       ASSESSMENT:    1. Permanent atrial fibrillation ([place])   2. Primary hypertension   3. Chronic diastolic heart failure (HCC)    PLAN:    In order of problems listed above:   #Permanent atrial fibrillation Rate control strategy in place.  Currently using diltiazem and amiodarone for rate control. The patient still would like to avoid long-term anticoagulation given his concerns about elevated bleeding risk, poorly controlled blood pressure.  In the perioperative period I would like him to continue his antihypertensive medications to avoid severe hypertension while under anesthesia as occurred during the last watchman implant attempt.  ------------------  I have seen Justin [name] in the office today who is being considered for a Watchman left atrial appendage closure device. I believe they will benefit from this procedure given their history of atrial fibrillation, CHA2DS2-VASc score of 3 and unadjusted ischemic stroke rate of 3.2% per year. Unfortunately, the patient is not felt to be a long term anticoagulation candidate secondary to concerns re: increased bleeding risk. The patient's chart has been reviewed  and I feel that they would be a candidate for short term oral anticoagulation after Watchman implant.   It is my belief that after undergoing a LAA closure procedure, Justin [name] will not need long term anticoagulation which eliminates anticoagulation side effects and major bleeding risk.   Procedural risks for the Watchman implant have been reviewed with the patient including a 0.5% risk of stroke, <1% risk of perforation and <1% risk of device embolization. Other risks include bleeding, vascular damage, tamponade, worsening renal function, and  death. The patient understands these risk and wishes to proceed.     The published clinical data on the safety and effectiveness of WATCHMAN include but are not limited to the following: - Holmes DR, Mechele Claude, Sick P et al. for the PROTECT AF Investigators. Percutaneous closure of the left atrial appendage versus warfarin therapy for prevention of stroke in patients with atrial fibrillation: a randomised non-inferiority trial. Lancet 2009; 374: 534-42. Mechele Claude, Doshi SK, Abelardo Diesel D et al. on behalf of the PROTECT AF Investigators. Percutaneous Left Atrial Appendage Closure for Stroke Prophylaxis in Patients With Atrial Fibrillation 2.3-Year Follow-up of the PROTECT AF (Watchman Left Atrial Appendage System for Embolic Protection in Patients With Atrial Fibrillation) Trial. Circulation 2013; 127:720-729. - Alli O, Doshi S,  Kar S, Reddy VY, Sievert H et al. Quality of Life Assessment in the Randomized PROTECT AF (Percutaneous Closure of the Left Atrial Appendage Versus Warfarin Therapy for Prevention of Stroke in Patients With Atrial Fibrillation) Trial of Patients at Risk for Stroke With Nonvalvular Atrial Fibrillation. J Am Coll Cardiol 2013; [id number]. Vertell Limber DR, Tarri Abernethy, Price M, [place], Sievert H, Doshi S, Huber K, Reddy V. Prospective randomized evaluation of the Watchman left atrial appendage Device in patients with atrial fibrillation versus long-term warfarin therapy; the PREVAIL trial. Journal of the [employer] of Cardiology, Vol. 4, No. 1, 2014, 1-11. - Kar S, Doshi SK, Sadhu A, Horton R, Osorio J et al. Primary outcome evaluation of a next-generation left atrial appendage closure device: results from the PINNACLE FLX trial. Circulation 2021;143(18)1754-1762.    After today's visit with the patient which was dedicated solely for shared decision making visit regarding LAA closure device, the patient decided to proceed with the LAA appendage closure procedure scheduled to be  done in the near future at [facility]. Recurrence  HAS-BLED score 2 Hypertension Yes  Abnormal renal and liver function (Dialysis, transplant, Cr >2.26 mg/dL /Cirrhosis or Bilirubin >2x Normal or AST/ALT/AP >3x Normal) No  Stroke No  Bleeding No  Labile INR (Unstable/high INR) No  Elderly (>65) Yes  Drugs or alcohol (? 8 drinks/week, anti-plt or NSAID) No   CHA2DS2-VASc Score = 3  The patient's score is based upon: CHF History: 1 HTN History: 1 Diabetes History: 0 Stroke History: 0 Vascular Disease History: 0 Age Score: 1 Gender Score: 0   #Chronic diastolic heart failure NYHA class II.  Warm and slightly volume overloaded on exam.  I am going to have him increase his Lasix to 40 mg by mouth once daily as his new standing dose.  I have asked him to weigh himself daily.  I have asked him to limit his liquid intake to 2 L/day.   Signed, Lars Mage, MD, [facility], [facility] 11/04/2022 1:54 PM    Electrophysiology [facility]

## 2022-11-04 ENCOUNTER — Encounter: Payer: Self-pay | Admitting: Cardiology

## 2022-11-04 ENCOUNTER — Ambulatory Visit: Payer: Medicare Other | Attending: Cardiology | Admitting: Cardiology

## 2022-11-04 VITALS — BP 152/94 | HR 60 | Ht 71.0 in | Wt 200.0 lb

## 2022-11-04 DIAGNOSIS — I5032 Chronic diastolic (congestive) heart failure: Secondary | ICD-10-CM | POA: Diagnosis not present

## 2022-11-04 DIAGNOSIS — I10 Essential (primary) hypertension: Secondary | ICD-10-CM

## 2022-11-04 DIAGNOSIS — I4821 Permanent atrial fibrillation: Secondary | ICD-10-CM

## 2022-11-04 MED ORDER — FUROSEMIDE 40 MG PO TABS: 40.0000 mg | ORAL_TABLET | Freq: Every day | ORAL | 3 refills | Status: DC

## 2022-11-04 NOTE — Patient Instructions (Signed)
Please weight yourself daily - it is best to do this first thing in the morning with the same amount of clothes on.  Please limit your fluid intake to 2 liters or less per day  Medication Instructions:  Your physician has recommended you make the following change in your medication:  1) INCREASE Lasix (furosemide) to 40 mg daily  *If you need a refill on your cardiac medications before your next appointment, please call your pharmacy*  Follow-Up: At [facility], you and your health needs are our priority.  As part of our continuing mission to provide you with exceptional heart care, we have created designated Provider Care Teams.  These Care Teams include your primary Cardiologist (physician) and Advanced Practice Providers (APPs -  Physician Assistants and Nurse Practitioners) who all work together to provide you with the care you need, when you need it.  You will be contacted by Nurse Navigator, Lenice Llamas to schedule your procedure. If you have any questions she can be reached at [phone].

## 2022-11-05 ENCOUNTER — Inpatient Hospital Stay (HOSPITAL_COMMUNITY): Admit: 2022-11-05 | Payer: Medicare Other | Admitting: Cardiology

## 2022-11-05 ENCOUNTER — Encounter (HOSPITAL_COMMUNITY): Payer: Self-pay

## 2022-11-05 DIAGNOSIS — I480 Paroxysmal atrial fibrillation: Secondary | ICD-10-CM

## 2022-11-05 SURGERY — LEFT ATRIAL APPENDAGE OCCLUSION
Anesthesia: General

## 2022-11-11 ENCOUNTER — Telehealth: Payer: Self-pay

## 2022-11-11 ENCOUNTER — Other Ambulatory Visit: Payer: Self-pay

## 2022-11-11 DIAGNOSIS — I4821 Permanent atrial fibrillation: Secondary | ICD-10-CM

## 2022-11-11 DIAGNOSIS — I4819 Other persistent atrial fibrillation: Secondary | ICD-10-CM

## 2022-11-11 NOTE — Telephone Encounter (Signed)
Offered to schedule the patient for LAAO 11/26/2022, but he is scheduled for jury duty 4/17. Will schedule the patient for Watchman implant 02/04/2023. He will keep follow-up with Dr. Quentin Ore as scheduled 5/3. He was grateful for assistance.

## 2022-11-11 NOTE — Telephone Encounter (Signed)
-----   Message from Vickie Epley, MD sent at 11/04/2022  1:55 PM EDT ----- I think we are okay to reschedule.  I would have him continue his antihypertensive regimen in the perioperative period.  Hopefully we can avoid another episode of severe hypertension when he is under general anesthesia. Thanks, Lysbeth Galas

## 2022-11-20 ENCOUNTER — Other Ambulatory Visit: Payer: Self-pay | Admitting: Cardiology

## 2022-11-23 NOTE — Telephone Encounter (Signed)
Please advise on refill directions. OK to fill once daily? Thank you!

## 2022-11-25 NOTE — Telephone Encounter (Signed)
Pt's medication was sent to pt's pharmacy for 1 tablet daily, pt's starting dose is past, now pt should be taking 1 tablet daily. Rx sent to pt's pharmacy as requested. Confirmation received.

## 2022-12-11 ENCOUNTER — Ambulatory Visit: Payer: Medicare Other | Attending: Cardiology | Admitting: Cardiology

## 2022-12-11 ENCOUNTER — Encounter: Payer: Self-pay | Admitting: Cardiology

## 2022-12-11 VITALS — BP 120/68 | HR 64 | Ht 71.0 in | Wt 192.0 lb

## 2022-12-11 DIAGNOSIS — Z9189 Other specified personal risk factors, not elsewhere classified: Secondary | ICD-10-CM

## 2022-12-11 DIAGNOSIS — I4821 Permanent atrial fibrillation: Secondary | ICD-10-CM | POA: Diagnosis not present

## 2022-12-11 DIAGNOSIS — I10 Essential (primary) hypertension: Secondary | ICD-10-CM | POA: Diagnosis not present

## 2022-12-11 DIAGNOSIS — Z01812 Encounter for preprocedural laboratory examination: Secondary | ICD-10-CM

## 2022-12-11 NOTE — Patient Instructions (Signed)
Medication Instructions:  Your physician recommends that you continue on your current medications as directed. Please refer to the Current Medication list given to you today.  *If you need a refill on your cardiac medications before your next appointment, please call your pharmacy*   Lab Work: Today: BMP & CBC If you have labs (blood work) drawn today and your tests are completely normal, you will receive your results only by: MyChart Message (if you have MyChart) OR A paper copy in the mail If you have any lab test that is abnormal or we need to change your treatment, we will call you to review the results.   Testing/Procedures: None ordered   Follow-Up: At [facility], you and your health needs are our priority.  As part of our continuing mission to provide you with exceptional heart care, we have created designated Provider Care Teams.  These Care Teams include your primary Cardiologist (physician) and Advanced Practice Providers (APPs -  Physician Assistants and Nurse Practitioners) who all work together to provide you with the care you need, when you need it.  Your next appointment:   Will   be arranged  The format for your next appointment:   In Person  Provider:   Steffanie Dunn, MD{   Thank you for choosing [facility]!!   [phone]

## 2022-12-11 NOTE — Progress Notes (Signed)
Electrophysiology Office Follow up Visit Note:    Date:  12/11/2022   ID:  Justin [name], DOB 08-14-1950, MRN [id number]  PCP:  Charlane Ferretti, DO  [facility] Cardiologist:  Chrystie Nose, MD  [facility] Electrophysiologist:  Lanier Prude, MD    Interval History:    Justin [name] is a 72 y.o. male who presents for a follow up visit.   Presented for Watchman implant in the past but was cancelled after under anaesthesia due to extreme hypertension.   Last seen 11/04/2022. He was previously scheduled for a Watchman but this was cancelled due to AF w/ RVR prior to the procedure day at an appointment with Justin [name].   He is currently scheduled for Watchman 02/03/2022.   Today, he appears well. His blood pressure is well controlled in clinic today.  He denies any issues with LE edema. He is taking Lasix every day and his weight has been stable.  He denies any palpitations, chest pain, shortness of breath, or peripheral edema. No lightheadedness, headaches, syncope, orthopnea, or PND.      Past medical, surgical, social and family history were reviewed.  ROS:   Please see the history of present illness.    All other systems reviewed and are negative.  EKGs/Labs/Other Studies Reviewed:    The following studies were reviewed today:  EKG:  The ekg ordered today demonstrates atrial flutter, right bundle branch block, ventricular rate 64 bpm, left anterior fascicular block   Physical Exam:    VS:  BP 120/68   Pulse 64   Ht 5\' 11"  (1.803 m)   Wt 192 lb (87.1 kg)   SpO2 98%   BMI 26.78 kg/m     Wt Readings from Last 3 Encounters:  12/11/22 192 lb (87.1 kg)  11/04/22 200 lb (90.7 kg)  10/23/22 194 lb 0.1 oz (88 kg)     GEN: Well nourished, well developed in no acute distress CARDIAC: Irregularly irregular, no murmurs, rubs, gallops RESPIRATORY:  Clear to auscultation without rales, wheezing or rhonchi       ASSESSMENT:    1. Permanent atrial  fibrillation (HCC)   2. Primary hypertension   3. At high risk for bleeding   4. Pre-procedure lab exam    PLAN:    In order of problems listed above:  #Permanent AF Rate controlled. On OAC but would like to avoid long term exposure to [facility] given elevated bleeding risk.   have seen Justin [name] in the office today who is being considered for a Watchman left atrial appendage closure device. I believe they will benefit from this procedure given their history of atrial fibrillation, CHA2DS2-VASc score of 3 and unadjusted ischemic stroke rate of 3.2% per year. Unfortunately, the patient is not felt to be a long term anticoagulation candidate secondary to concerns re: increased bleeding risk. The patient's chart has been reviewed and I feel that they would be a candidate for short term oral anticoagulation after Watchman implant.    It is my belief that after undergoing a LAA closure procedure, Justin [name] will not need long term anticoagulation which eliminates anticoagulation side effects and major bleeding risk.    Procedural risks for the Watchman implant have been reviewed with the patient including a 0.5% risk of stroke, <1% risk of perforation and <1% risk of device embolization. Other risks include bleeding, vascular damage, tamponade, worsening renal function, and death. The patient understands these risk and wishes to proceed.  The published clinical data on the safety and effectiveness of WATCHMAN include but are not limited to the following: - Holmes DR, Everlene Farrier, Sick P et al. for the PROTECT AF Investigators. Percutaneous closure of the left atrial appendage versus warfarin therapy for prevention of stroke in patients with atrial fibrillation: a randomised non-inferiority trial. Lancet 2009; 374: 534-42. Everlene Farrier, Doshi SK, Isa Rankin D et al. on behalf of the PROTECT AF Investigators. Percutaneous Left Atrial Appendage Closure for Stroke Prophylaxis in Patients  With Atrial Fibrillation 2.3-Year Follow-up of the PROTECT AF (Watchman Left Atrial Appendage System for Embolic Protection in Patients With Atrial Fibrillation) Trial. Circulation 2013; 127:720-729. - Alli O, Doshi S,  Kar S, Reddy VY, Sievert H et al. Quality of Life Assessment in the Randomized PROTECT AF (Percutaneous Closure of the Left Atrial Appendage Versus Warfarin Therapy for Prevention of Stroke in Patients With Atrial Fibrillation) Trial of Patients at Risk for Stroke With Nonvalvular Atrial Fibrillation. J Am Coll Cardiol 2013; 61:1790-8. Aline August DR, [place], Price M, Whisenant B, Sievert H, Doshi S, Huber K, Reddy V. Prospective randomized evaluation of the Watchman left atrial appendage Device in patients with atrial fibrillation versus long-term warfarin therapy; the PREVAIL trial. Journal of the [employer] of Cardiology, Vol. 4, No. 1, 2014, 1-11. - Kar S, Doshi SK, Sadhu A, Horton R, Osorio J et al. Primary outcome evaluation of a next-generation left atrial appendage closure device: results from the PINNACLE FLX trial. Circulation 2021;143(18)1754-1762.      After today's visit with the patient which was dedicated solely for shared decision making visit regarding LAA closure device, the patient decided to proceed with the LAA appendage closure procedure scheduled to be done in the near future at [facility]. Recurrence   HAS-BLED score 2 Hypertension Yes  Abnormal renal and liver function (Dialysis, transplant, Cr >2.26 mg/dL /Cirrhosis or Bilirubin >2x Normal or AST/ALT/AP >3x Normal) No  Stroke No  Bleeding No  Labile INR (Unstable/high INR) No  Elderly (>65) Yes  Drugs or alcohol (? 8 drinks/week, anti-plt or NSAID) No    CHA2DS2-VASc Score = 3  The patient's score is based upon: CHF History: 1 HTN History: 1 Diabetes History: 0 Stroke History: 0 Vascular Disease History: 0 Age Score: 1 Gender Score: 0  #Hypertension At goal today.  Recommend checking  blood pressures 1-2 times per week at home and recording the values.  Recommend bringing these recordings to the primary care physician.  Plan to continue antihypertensive medications around the time of watchman implant.   I,Mathew Stumpf,acting as a Neurosurgeon for Lanier Prude, MD.,have documented all relevant documentation on the behalf of Lanier Prude, MD,as directed by  Lanier Prude, MD while in the presence of Lanier Prude, MD.  I, Lanier Prude, MD, have reviewed all documentation for this visit. The documentation on 12/11/22 for the exam, diagnosis, procedures, and orders are all accurate and complete.   Signed, Steffanie Dunn, MD, [facility] 12/11/2022 1:18 PM    Electrophysiology [facility]

## 2022-12-12 LAB — CBC
Hematocrit: 35 % — ABNORMAL LOW (ref 37.5–51.0)
Hemoglobin: 11.9 g/dL — ABNORMAL LOW (ref 13.0–17.7)
MCH: 34.6 pg — ABNORMAL HIGH (ref 26.6–33.0)
MCHC: 34 g/dL (ref 31.5–35.7)
MCV: 102 fL — ABNORMAL HIGH (ref 79–97)
Platelets: 172 10*3/uL (ref 150–450)
RBC: 3.44 x10E6/uL — ABNORMAL LOW (ref 4.14–5.80)
RDW: 12.8 % (ref 11.6–15.4)
WBC: 4.3 10*3/uL (ref 3.4–10.8)

## 2022-12-12 LAB — BASIC METABOLIC PANEL
BUN/Creatinine Ratio: 17 (ref 10–24)
BUN: 46 mg/dL — ABNORMAL HIGH (ref 8–27)
CO2: 25 mmol/L (ref 20–29)
Calcium: 9.5 mg/dL (ref 8.6–10.2)
Chloride: 100 mmol/L (ref 96–106)
Creatinine, Ser: 2.66 mg/dL — ABNORMAL HIGH (ref 0.76–1.27)
Glucose: 107 mg/dL — ABNORMAL HIGH (ref 70–99)
Potassium: 4 mmol/L (ref 3.5–5.2)
Sodium: 144 mmol/L (ref 134–144)
eGFR: 25 mL/min/{1.73_m2} — ABNORMAL LOW (ref >59)

## 2022-12-16 ENCOUNTER — Telehealth: Payer: Self-pay

## 2022-12-16 MED ORDER — FUROSEMIDE 40 MG PO TABS: 40.0000 mg | ORAL_TABLET | ORAL | 3 refills | Status: DC

## 2022-12-16 NOTE — Telephone Encounter (Signed)
The patient has been notified of the result and verbalized understanding.  All questions (if any) were answered. Frutoso Schatz, RN 12/16/2022 4:22 PM

## 2022-12-16 NOTE — Telephone Encounter (Signed)
-----   Message from Lanier Prude, MD sent at 12/13/2022 12:17 PM EDT ----- Kidney function looks like he may be a little dehydrated.   Carly, Have him change his lasix to every other day please.  Thanks!  Sheria Lang T. Lalla Brothers, MD, [facility], [facility] Cardiac Electrophysiology

## 2022-12-22 ENCOUNTER — Telehealth: Payer: Self-pay

## 2022-12-22 NOTE — Telephone Encounter (Signed)
Justin [name]'s LAAO was cancelled due to high BP. He has seen Dr. Lalla Brothers and his BP was stable, but since then his creatinine increased and Dr. Lalla Brothers decreased his Lasix to qod.  To prevent another cancellation, scheduled the patient for check-up on 6/5 with Carlean Jews. He will bring BP log at that time.   Also reiterated to him the importance of discussing increasing creatinine with PCP.  He was grateful for assistance.

## 2023-01-11 NOTE — Progress Notes (Unsigned)
[facility]   MULTIDISCIPLINARY HEART VALVE CLINIC                                     Cardiology Office Note:    Date:  01/14/2023   ID:  Justin [name], DOB 04/23/1951, MRN [id number]  PCP:  Charlane Ferretti, DO  [facility] Cardiologist:  Chrystie Nose, MD  [facility] Electrophysiologist:  Lanier Prude, MD   Referring MD: Charlane Ferretti, DO   Set up LAAO with Watchman- 02/03/23  History of Present Illness:    Justin [name] is a 72 y.o. male with a hx of spondylitis, gout, HTN, RBBB, CKD stage IIIb, chronic diastolic CHF, persistent afib (failed DCCV in 12/2021) with excessive bruising who is here to discuss possible upcoming LAAO with Watchman- scheduled for 02/03/23.  Justin [name] was referred to Dr .Rennis Golden in 12/2021 for evaluation of new onset atrial fibrillation. He reported fatigue and some shortness of breath. His metoprolol was increased and he was given samples of Xarelto given issues with cost. He also noted issues with excessive bruising. Echo 05/05/22 showed EF 65-70%, severe LAE, mild thickening of AoV and mild aortic dilation.    He underwent DCCV 12/24/2021. He received 3 shocks at 200 J. He converted to 4-5 beats of NSR before returning to atrial fibrillation.   He was seen by Dr. Lalla Brothers for consideration of LAAO. Cardiac CT 06/22/22 showed severe biatrial enlargement with a chicken wing appendage average diameter 19.8 mm depth 22 mm suitable for a 24 mm Watchman FLX device as well as an elevated coronary calcium score. He was scheduled for LAAO on 09/03/22 but this was ultimately cancelled due to uncontrolled hypertension and medications were adjusted. He then developed afib with RVR and acute CHF.   Today the patient presents to clinic for follow up. BPs are stable 120/80. New medication adjustments have really helped keep it under control. No CP or SOB. No LE edema, orthopnea or PND. Weight has come down. No dizziness or syncope. No blood in stool  or urine. No palpitations. Does have some right arm tingling and aching that comes and goes. Not related to exertion.   Past Medical History:  Diagnosis Date   Angioedema 03/28/2012   "tongue and throat"- was never known why"was taking humira at the time.   Arthritis    "hands""back"   Asthma    treated for over 90yrs ago   Chronic back pain    scoliosis/spondylosis   Complication of anesthesia    may try fight when waking up   History of bronchitis 10+yrs ago   History of colon polyps    History of gout    last time many yrs ago   History of kidney stones    Joint pain    Joint swelling    Lyme disease    just completed treatment with Doxycycline    Pneumonia 08/2013   Spondylitis, ankylosing (HCC)    Weakness    and numbness in right leg    Past Surgical History:  Procedure Laterality Date   APPENDECTOMY     BILIARY DILATION  07/25/2020   Procedure: BILIARY DILATION;  Surgeon: Vida Rigger, MD;  Location: [facility] ENDOSCOPY;  Service: Endoscopy;;   CARDIOVERSION N/A 12/24/2021   Procedure: CARDIOVERSION;  Surgeon: Sande Rives, MD;  Location: [facility] ENDOSCOPY;  Service: Cardiovascular;  Laterality: N/A;   CARDIOVERSION  N/A 10/23/2022   Procedure: CARDIOVERSION;  Surgeon: Lewayne Bunting, MD;  Location: [facility] ENDOSCOPY;  Service: Cardiovascular;  Laterality: N/A;   CHOLECYSTECTOMY     COLONOSCOPY     ERCP N/A 07/25/2020   Procedure: ENDOSCOPIC RETROGRADE CHOLANGIOPANCREATOGRAPHY (ERCP);  Surgeon: Vida Rigger, MD;  Location: [facility] ENDOSCOPY;  Service: Endoscopy;  Laterality: N/A;   ESOPHAGOGASTRODUODENOSCOPY     knuckle surgery     right middle finger a couple of times   LUMBAR LAMINECTOMY/DECOMPRESSION MICRODISCECTOMY Right 01/11/2013   Procedure: CENTRAL DECOMPRESSION LUMBAR LAMINECTOMYL4-L5 RIGHT, MICRODISCECTOMY L4-L5 RIGHT     (1 LEVEL);  Surgeon: Jacki Cones, MD;  Location: [facility] ORS;  Service: Orthopedics;  Laterality: Right;   REMOVAL OF STONES  07/25/2020   Procedure:  REMOVAL OF STONES;  Surgeon: Vida Rigger, MD;  Location: [facility] ENDOSCOPY;  Service: Endoscopy;;   SEPTOPLASTY     SHOULDER OPEN ROTATOR CUFF REPAIR Left    x 2;impingement syndrome and torn rotator cuff   SPHINCTEROTOMY  07/25/2020   Procedure: SPHINCTEROTOMY;  Surgeon: Vida Rigger, MD;  Location: [facility] ENDOSCOPY;  Service: Endoscopy;;   SPINAL CORD STIMULATOR INSERTION N/A 01/04/2014   Procedure: LUMBAR SPINAL CORD STIMULATOR INSERTION;  Surgeon: Venita Lick, MD;  Location: [facility] OR;  Service: Orthopedics;  Laterality: N/A;   TEE WITHOUT CARDIOVERSION N/A 09/03/2022   Procedure: TRANSESOPHAGEAL ECHOCARDIOGRAM (TEE);  Surgeon: Lanier Prude, MD;  Location: [facility] INVASIVE CV LAB;  Service: Cardiovascular;  Laterality: N/A;   TONSILLECTOMY      Current Medications: Current Meds  Medication Sig   albuterol (VENTOLIN HFA) 108 (90 Base) MCG/ACT inhaler Inhale 2 puffs into the lungs every 4 (four) hours as needed for wheezing or shortness of breath.   amiodarone (PACERONE) 200 MG tablet Take 1 tablet (200 mg total) by mouth daily.   Ascorbic Acid (VITAMIN C PO) Take 1,000 mg by mouth daily.   cholecalciferol (VITAMIN D3) 25 MCG (1000 UNIT) tablet Take 1,000 Units by mouth daily.   diltiazem (CARDIZEM CD) 240 MG 24 hr capsule Take 1 capsule (240 mg total) by mouth daily.   furosemide (LASIX) 40 MG tablet Take 1 tablet (40 mg total) by mouth every other day.   hydrALAZINE (APRESOLINE) 50 MG tablet Take 1 tablet (50 mg total) by mouth 3 (three) times daily.   ibuprofen (ADVIL) 200 MG tablet Take 400 mg by mouth daily as needed for mild pain or moderate pain.   metoprolol succinate (TOPROL-XL) 100 MG 24 hr tablet TAKE 1 TABLET BY MOUTH EVERY DAY   Multiple Vitamins-Minerals (MULTIVITAMIN WITH MINERALS) tablet Take 1 tablet by mouth daily.   NARCAN 4 MG/0.1ML LIQD nasal spray kit Take by nasal route every 3 minutes until patient awakes or EMS arrives.   omeprazole (PRILOSEC) 40 MG capsule Take 40 mg by  mouth daily.   OVER THE COUNTER MEDICATION Apply 1 application  topically daily. CBD Oil   OXYCONTIN 30 MG 12 hr tablet Take 30 mg by mouth every 12 (twelve) hours.   rivaroxaban (XARELTO) 20 MG TABS tablet Take 20 mg by mouth at bedtime.   tiZANidine (ZANAFLEX) 4 MG tablet Take 4 mg by mouth in the morning and at bedtime.     Allergies:   Indocin [indomethacin] and Norco [hydrocodone-acetaminophen]   Social History   Socioeconomic History   Marital status: Married    Spouse name: Not on file   Number of children: Not on file   Years of education: Not on file   Highest education  level: Not on file  Occupational History   Not on file  Tobacco Use   Smoking status: Former    Types: Cigars   Smokeless tobacco: Never   Tobacco comments:    quit smoking cigarettes 10-11yrs ago  Vaping Use   Vaping Use: Never used  Substance and Sexual Activity   Alcohol use: Yes    Comment: couple of beers a week   Drug use: No   Sexual activity: Yes  Other Topics Concern   Not on file  Social History Narrative   Not on file   Social Determinants of Health   Financial Resource Strain: Not on file  Food Insecurity: Not on file  Transportation Needs: Not on file  Physical Activity: Not on file  Stress: Not on file  Social Connections: Not on file     Family History: The patient's family history includes Hypertension in an other family member.  ROS:   Please see the history of present illness.    All other systems reviewed and are negative.  EKGs/Labs/Other Studies Reviewed:    The following studies were reviewed today:  CT 03-Jul-2022 IMPRESSION: 1.  Severe bi atrial enlargement   2.  No PFO/ASD   3. Chicken Wing appendage average diameter 19.8 mm depth 22 mm Suitable for a 24 mm Watchman FLX device   4.  Coronary calcium score 1208, 89 th percentile for age/sex   5.  Normal ascending thoracic aorta 3.7 cm   6.  Normal PV anatomy see measurements above   7.  No  pericardial effusion   EKG:  EKG is ordered today.  The ekg ordered today demonstrates afib with RBBB, LAFB HR 80  Recent Labs: 10/20/2022: ALT 26; TSH 2.660 01/13/2023: BUN 31; Creatinine, Ser 2.14; Hemoglobin 12.9; Platelets 210; Potassium 4.6; Sodium 142  Recent Lipid Panel No results found for: "CHOL", "TRIG", "HDL", "CHOLHDL", "VLDL", "LDLCALC", "LDLDIRECT"   Risk Assessment/Calculations:    HAS-BLED score 3 Hypertension Yes  Abnormal renal and liver function (Dialysis, transplant, Cr >2.26 mg/dL /Cirrhosis or Bilirubin >2x Normal or AST/ALT/AP >3x Normal) No  Stroke No  Bleeding No  Labile INR (Unstable/high INR) No  Elderly (>65) Yes  Drugs or alcohol (? 8 drinks/week, anti-plt or NSAID) Yes    CHA2DS2-VASc Score = 3  The patient's score is based upon: CHF History: 1 HTN History: 1 Diabetes History: 0 Stroke History: 0 Vascular Disease History: 0 Age Score: 1 Gender Score: 0  Physical Exam:    VS:  BP (!) 144/80   Pulse 80   Ht 5\' 11"  (1.803 m)   Wt 195 lb (88.5 kg)   SpO2 99%   BMI 27.20 kg/m     Wt Readings from Last 3 Encounters:  01/13/23 195 lb (88.5 kg)  12/11/22 192 lb (87.1 kg)  11/04/22 200 lb (90.7 kg)     GEN:  Well nourished, well developed in no acute distress HEENT: Normal NECK: No JVD LYMPHATICS: No lymphadenopathy CARDIAC: irreg irreg.  no murmurs, rubs, gallops RESPIRATORY:  Clear to auscultation without rales, wheezing or rhonchi  ABDOMEN: Soft, non-tender, non-distended MUSCULOSKELETAL:  No edema; No deformity  SKIN: Warm and dry NEUROLOGIC:  Alert and oriented x 3 PSYCHIATRIC:  Normal affect   ASSESSMENT:    1. Permanent atrial fibrillation (HCC)   2. Chronic diastolic heart failure (HCC)   3. Essential hypertension   4. Atrial fibrillation, unspecified type (HCC)   5. Stage 3b chronic kidney disease (HCC)  PLAN:    In order of problems listed above:  Persistent atrial fibrillation with high risk of bleeding due to  excessive bruising: rate now well controlled on toprol xl 100mg  QHS, cardizem cd 240mg  daily, and amiodarone 200mg  daily. Set up for LAAO with Watchman 02/03/23. ECG/BMET/CBC today.   Chronic diastolic CHF: appears euvolemic. He has NYHA class I symptoms. Continue lasix 40mg  QOD.  HTN: BP much better controlled on new regimen. Average 120/80 at home. No changes made. He will take all his BP meds on the morning of surgery   CKD stage IIIb: baseline creat ~1.4-2.5. Check bmet today.   Medication Adjustments/Labs and Tests Ordered: Current medicines are reviewed at length with the patient today.  Concerns regarding medicines are outlined above.  Orders Placed This Encounter  Procedures   Basic metabolic panel   CBC   EKG 12-Lead   No orders of the defined types were placed in this encounter.   Patient Instructions  Medication Instructions:  Your physician recommends that you continue on your current medications as directed. Please refer to the Current Medication list given to you today.  *If you need a refill on your cardiac medications before your next appointment, please call your pharmacy*   Lab Work: TODAY: BMET, CBC If you have labs (blood work) drawn today and your tests are completely normal, you will receive your results only by: MyChart Message (if you have MyChart) OR A paper copy in the mail If you have any lab test that is abnormal or we need to change your treatment, we will call you to review the results.   Testing/Procedures: KATY, THE NURSE WILL BE IN TOUCH WITH PRE-WATCHMAN INSTRUCTION    Follow-Up: At [facility], you and your health needs are our priority.  As part of our continuing mission to provide you with exceptional heart care, we have created designated Provider Care Teams.  These Care Teams include your primary Cardiologist (physician) and Advanced Practice Providers (APPs -  Physician Assistants and Nurse Practitioners) who all work together to  provide you with the care you need, when you need it.  We recommend signing up for the patient portal called "MyChart".  Sign up information is provided on this After Visit Summary.  MyChart is used to connect with patients for Virtual Visits (Telemedicine).  Patients are able to view lab/test results, encounter notes, upcoming appointments, etc.  Non-urgent messages can be sent to your provider as well.   To learn more about what you can do with MyChart, go to [email or web address].    Your next appointment:   POST PROCEDURAL FOLLOW-UP    Signed, Cline Crock, PA-C  01/14/2023 9:45 AM    [facility]

## 2023-01-13 ENCOUNTER — Ambulatory Visit: Payer: Medicare Other | Attending: Physician Assistant | Admitting: Physician Assistant

## 2023-01-13 VITALS — BP 144/80 | HR 80 | Ht 71.0 in | Wt 195.0 lb

## 2023-01-13 DIAGNOSIS — I4821 Permanent atrial fibrillation: Secondary | ICD-10-CM | POA: Diagnosis not present

## 2023-01-13 DIAGNOSIS — N1832 Chronic kidney disease, stage 3b: Secondary | ICD-10-CM

## 2023-01-13 DIAGNOSIS — I10 Essential (primary) hypertension: Secondary | ICD-10-CM | POA: Diagnosis not present

## 2023-01-13 DIAGNOSIS — I5032 Chronic diastolic (congestive) heart failure: Secondary | ICD-10-CM | POA: Diagnosis not present

## 2023-01-13 DIAGNOSIS — I4891 Unspecified atrial fibrillation: Secondary | ICD-10-CM

## 2023-01-13 NOTE — Patient Instructions (Signed)
Medication Instructions:  Your physician recommends that you continue on your current medications as directed. Please refer to the Current Medication list given to you today.  *If you need a refill on your cardiac medications before your next appointment, please call your pharmacy*   Lab Work: TODAY: BMET, CBC If you have labs (blood work) drawn today and your tests are completely normal, you will receive your results only by: MyChart Message (if you have MyChart) OR A paper copy in the mail If you have any lab test that is abnormal or we need to change your treatment, we will call you to review the results.   Testing/Procedures: KATY, THE NURSE WILL BE IN TOUCH WITH PRE-WATCHMAN INSTRUCTION    Follow-Up: At [facility], you and your health needs are our priority.  As part of our continuing mission to provide you with exceptional heart care, we have created designated Provider Care Teams.  These Care Teams include your primary Cardiologist (physician) and Advanced Practice Providers (APPs -  Physician Assistants and Nurse Practitioners) who all work together to provide you with the care you need, when you need it.  We recommend signing up for the patient portal called "MyChart".  Sign up information is provided on this After Visit Summary.  MyChart is used to connect with patients for Virtual Visits (Telemedicine).  Patients are able to view lab/test results, encounter notes, upcoming appointments, etc.  Non-urgent messages can be sent to your provider as well.   To learn more about what you can do with MyChart, go to [email or web address].    Your next appointment:   POST PROCEDURAL FOLLOW-UP

## 2023-01-14 LAB — CBC
Hematocrit: 37.4 % — ABNORMAL LOW (ref 37.5–51.0)
Hemoglobin: 12.9 g/dL — ABNORMAL LOW (ref 13.0–17.7)
MCH: 34.5 pg — ABNORMAL HIGH (ref 26.6–33.0)
MCHC: 34.5 g/dL (ref 31.5–35.7)
MCV: 100 fL — ABNORMAL HIGH (ref 79–97)
Platelets: 210 10*3/uL (ref 150–450)
RBC: 3.74 x10E6/uL — ABNORMAL LOW (ref 4.14–5.80)
RDW: 12.7 % (ref 11.6–15.4)
WBC: 5.9 10*3/uL (ref 3.4–10.8)

## 2023-01-14 LAB — BASIC METABOLIC PANEL
BUN/Creatinine Ratio: 14 (ref 10–24)
BUN: 31 mg/dL — ABNORMAL HIGH (ref 8–27)
CO2: 23 mmol/L (ref 20–29)
Calcium: 9.6 mg/dL (ref 8.6–10.2)
Chloride: 102 mmol/L (ref 96–106)
Creatinine, Ser: 2.14 mg/dL — ABNORMAL HIGH (ref 0.76–1.27)
Glucose: 112 mg/dL — ABNORMAL HIGH (ref 70–99)
Potassium: 4.6 mmol/L (ref 3.5–5.2)
Sodium: 142 mmol/L (ref 134–144)
eGFR: 32 mL/min/{1.73_m2} — ABNORMAL LOW (ref >59)

## 2023-01-19 DIAGNOSIS — Z79891 Long term (current) use of opiate analgesic: Secondary | ICD-10-CM | POA: Diagnosis not present

## 2023-01-19 DIAGNOSIS — Z5181 Encounter for therapeutic drug level monitoring: Secondary | ICD-10-CM | POA: Diagnosis not present

## 2023-01-19 DIAGNOSIS — G894 Chronic pain syndrome: Secondary | ICD-10-CM | POA: Diagnosis not present

## 2023-02-02 ENCOUNTER — Telehealth: Payer: Self-pay | Admitting: Cardiology

## 2023-02-02 NOTE — Anesthesia Preprocedure Evaluation (Addendum)
Anesthesia Evaluation  Patient identified by MRN, date of birth, ID band Patient awake    Reviewed: Allergy & Precautions, NPO status , Patient's Chart, lab work & pertinent test results, reviewed documented beta blocker date and time   History of Anesthesia Complications (+) history of anesthetic complications (sore throat/wheezing)  Airway Mallampati: II  TM Distance: >3 FB Neck ROM: Full  Mouth opening: Limited Mouth Opening  Dental  (+) Dental Advisory Given   Pulmonary asthma , COPD,  COPD inhaler, former smoker   breath sounds clear to auscultation       Cardiovascular hypertension, Pt. on medications and Pt. on home beta blockers (-) angina + CAD  + dysrhythmias Atrial Fibrillation  Rhythm:Irregular Rate:Bradycardia  08/2022 ECHO: EF 50-55%, low normal LVF with mod LVH, normal RVF, no significant valvular abnormalities   Neuro/Psych negative neurological ROS     GI/Hepatic ,GERD  Medicated and Controlled,,(+) Hepatitis -  Endo/Other  negative endocrine ROS    Renal/GU Renal InsufficiencyRenal disease     Musculoskeletal   Abdominal   Peds  Hematology xarelto   Anesthesia Other Findings   Reproductive/Obstetrics                             Anesthesia Physical Anesthesia Plan  ASA: 3  Anesthesia Plan: General   Post-op Pain Management: Tylenol PO (pre-op)*   Induction: Intravenous  PONV Risk Score and Plan: 2 and Dexamethasone  Airway Management Planned: Oral ETT  Additional Equipment: ClearSight  Intra-op Plan:   Post-operative Plan: Extubation in OR  Informed Consent: I have reviewed the patients History and Physical, chart, labs and discussed the procedure including the risks, benefits and alternatives for the proposed anesthesia with the patient or authorized representative who has indicated his/her understanding and acceptance.     Dental advisory given  Plan  Discussed with: CRNA and Surgeon  Anesthesia Plan Comments:         Anesthesia Quick Evaluation

## 2023-02-02 NOTE — Telephone Encounter (Signed)
Attempted to reach Mr. Requena for pre-LAAO call with no answer and no availability to leave VM. He is scheduled for Watchman procedure 6/26 at 0830. Will attempt to call again later today.   Georgie Chard NP-C Structural Heart Team  Pager: [phone] Phone: [phone]

## 2023-02-02 NOTE — Telephone Encounter (Signed)
Spoke to patient regarding LAAO with Watchman tomorrow morning with Dr. Lalla Brothers. No further questions.   Georgie Chard NP-C Structural Heart Team  Pager: [phone] Phone: [phone]

## 2023-02-03 ENCOUNTER — Inpatient Hospital Stay (HOSPITAL_COMMUNITY): Payer: Medicare Other | Admitting: Anesthesiology

## 2023-02-03 ENCOUNTER — Inpatient Hospital Stay (HOSPITAL_COMMUNITY): Payer: Medicare Other

## 2023-02-03 ENCOUNTER — Inpatient Hospital Stay (HOSPITAL_COMMUNITY)
Admission: RE | Admit: 2023-02-03 | Discharge: 2023-02-03 | DRG: 274 | Disposition: A | Discharge status: Home / Self Care | Payer: Medicare Other | Attending: Cardiology | Admitting: Cardiology

## 2023-02-03 ENCOUNTER — Other Ambulatory Visit: Payer: Self-pay

## 2023-02-03 ENCOUNTER — Encounter: Payer: Self-pay | Admitting: Cardiology

## 2023-02-03 ENCOUNTER — Other Ambulatory Visit: Payer: Self-pay | Admitting: Cardiology

## 2023-02-03 ENCOUNTER — Encounter (HOSPITAL_COMMUNITY)
Admission: RE | Disposition: A | Discharge status: Home / Self Care | Payer: Self-pay | Source: Home / Self Care | Attending: Cardiology

## 2023-02-03 ENCOUNTER — Encounter (HOSPITAL_COMMUNITY): Payer: Self-pay | Admitting: Cardiology

## 2023-02-03 DIAGNOSIS — I4819 Other persistent atrial fibrillation: Secondary | ICD-10-CM | POA: Diagnosis not present

## 2023-02-03 DIAGNOSIS — I480 Paroxysmal atrial fibrillation: Principal | ICD-10-CM

## 2023-02-03 DIAGNOSIS — I4821 Permanent atrial fibrillation: Secondary | ICD-10-CM | POA: Diagnosis not present

## 2023-02-03 DIAGNOSIS — R931 Abnormal findings on diagnostic imaging of heart and coronary circulation: Secondary | ICD-10-CM | POA: Diagnosis present

## 2023-02-03 DIAGNOSIS — Z006 Encounter for examination for normal comparison and control in clinical research program: Secondary | ICD-10-CM

## 2023-02-03 DIAGNOSIS — I2510 Atherosclerotic heart disease of native coronary artery without angina pectoris: Secondary | ICD-10-CM

## 2023-02-03 DIAGNOSIS — J449 Chronic obstructive pulmonary disease, unspecified: Secondary | ICD-10-CM | POA: Diagnosis present

## 2023-02-03 DIAGNOSIS — I771 Stricture of artery: Secondary | ICD-10-CM | POA: Diagnosis not present

## 2023-02-03 DIAGNOSIS — Z95818 Presence of other cardiac implants and grafts: Secondary | ICD-10-CM

## 2023-02-03 DIAGNOSIS — K219 Gastro-esophageal reflux disease without esophagitis: Secondary | ICD-10-CM | POA: Diagnosis present

## 2023-02-03 DIAGNOSIS — I10 Essential (primary) hypertension: Secondary | ICD-10-CM

## 2023-02-03 DIAGNOSIS — Z87891 Personal history of nicotine dependence: Secondary | ICD-10-CM

## 2023-02-03 DIAGNOSIS — I4891 Unspecified atrial fibrillation: Secondary | ICD-10-CM | POA: Diagnosis not present

## 2023-02-03 DIAGNOSIS — N1832 Chronic kidney disease, stage 3b: Secondary | ICD-10-CM | POA: Diagnosis present

## 2023-02-03 DIAGNOSIS — I5032 Chronic diastolic (congestive) heart failure: Secondary | ICD-10-CM | POA: Diagnosis present

## 2023-02-03 DIAGNOSIS — I700 Atherosclerosis of aorta: Secondary | ICD-10-CM | POA: Diagnosis not present

## 2023-02-03 DIAGNOSIS — I517 Cardiomegaly: Secondary | ICD-10-CM | POA: Diagnosis not present

## 2023-02-03 DIAGNOSIS — I130 Hypertensive heart and chronic kidney disease with heart failure and stage 1 through stage 4 chronic kidney disease, or unspecified chronic kidney disease: Secondary | ICD-10-CM | POA: Diagnosis present

## 2023-02-03 DIAGNOSIS — I452 Bifascicular block: Secondary | ICD-10-CM | POA: Diagnosis present

## 2023-02-03 DIAGNOSIS — Z8619 Personal history of other infectious and parasitic diseases: Secondary | ICD-10-CM | POA: Diagnosis present

## 2023-02-03 DIAGNOSIS — Z01818 Encounter for other preprocedural examination: Secondary | ICD-10-CM | POA: Diagnosis not present

## 2023-02-03 DIAGNOSIS — I4510 Unspecified right bundle-branch block: Secondary | ICD-10-CM | POA: Diagnosis present

## 2023-02-03 DIAGNOSIS — E1122 Type 2 diabetes mellitus with diabetic chronic kidney disease: Secondary | ICD-10-CM | POA: Diagnosis present

## 2023-02-03 HISTORY — PX: LEFT ATRIAL APPENDAGE OCCLUSION: EP1229

## 2023-02-03 HISTORY — PX: TEE WITHOUT CARDIOVERSION: SHX5443

## 2023-02-03 HISTORY — DX: Unspecified atrial fibrillation: I48.91

## 2023-02-03 LAB — ECHO TEE

## 2023-02-03 LAB — BASIC METABOLIC PANEL
Anion gap: 17 — ABNORMAL HIGH (ref 5–15)
BUN: 46 mg/dL — ABNORMAL HIGH (ref 8–23)
CO2: 21 mmol/L — ABNORMAL LOW (ref 22–32)
Calcium: 9.4 mg/dL (ref 8.9–10.3)
Chloride: 101 mmol/L (ref 98–111)
Creatinine, Ser: 2.55 mg/dL — ABNORMAL HIGH (ref 0.61–1.24)
GFR, Estimated: 26 mL/min — ABNORMAL LOW (ref >60)
Glucose, Bld: 90 mg/dL (ref 70–99)
Potassium: 3.6 mmol/L (ref 3.5–5.1)
Sodium: 139 mmol/L (ref 135–145)

## 2023-02-03 LAB — TYPE AND SCREEN
ABO/RH(D): A NEG
Antibody Screen: NEGATIVE

## 2023-02-03 LAB — SURGICAL PCR SCREEN
MRSA, PCR: NEGATIVE
Staphylococcus aureus: NEGATIVE

## 2023-02-03 SURGERY — LEFT ATRIAL APPENDAGE OCCLUSION
Anesthesia: General

## 2023-02-03 MED ORDER — SODIUM CHLORIDE 0.9% FLUSH: 3.0000 mL | INTRAVENOUS | Status: DC | PRN

## 2023-02-03 MED ORDER — EPHEDRINE 5 MG/ML INJ
10.0000 mg | Freq: Once | INTRAVENOUS | Status: AC
  Administered 2023-02-03: 10 mg via INTRAVENOUS
  Filled 2023-02-03: qty 2

## 2023-02-03 MED ORDER — CEFAZOLIN SODIUM-DEXTROSE 2-4 GM/100ML-% IV SOLN
2.0000 g | INTRAVENOUS | Status: AC
  Administered 2023-02-03: 2 g via INTRAVENOUS
  Filled 2023-02-03: qty 100

## 2023-02-03 MED ORDER — ACETAMINOPHEN 500 MG PO TABS
ORAL_TABLET | ORAL | Status: AC
  Administered 2023-02-03: 1000 mg via ORAL
  Filled 2023-02-03: qty 2

## 2023-02-03 MED ORDER — LIDOCAINE 2% (20 MG/ML) 5 ML SYRINGE
INTRAMUSCULAR | Status: DC | PRN
  Administered 2023-02-03: 20 mg via INTRAVENOUS

## 2023-02-03 MED ORDER — ACETAMINOPHEN 500 MG PO TABS: 1000.0000 mg | ORAL_TABLET | Freq: Once | ORAL | Status: AC

## 2023-02-03 MED ORDER — SODIUM CHLORIDE 0.9 % IV SOLN: 250.0000 mL | INTRAVENOUS | Status: DC | PRN

## 2023-02-03 MED ORDER — DEXAMETHASONE SODIUM PHOSPHATE 10 MG/ML IJ SOLN
INTRAMUSCULAR | Status: DC | PRN
  Administered 2023-02-03: 10 mg via INTRAVENOUS

## 2023-02-03 MED ORDER — PROPOFOL 10 MG/ML IV BOLUS
INTRAVENOUS | Status: DC | PRN
  Administered 2023-02-03: 100 mg via INTRAVENOUS

## 2023-02-03 MED ORDER — HEPARIN (PORCINE) IN NACL 1000-0.9 UT/500ML-% IV SOLN
INTRAVENOUS | Status: DC | PRN
  Administered 2023-02-03: 500 mL

## 2023-02-03 MED ORDER — MIDAZOLAM HCL 2 MG/2ML IJ SOLN
INTRAMUSCULAR | Status: AC
  Filled 2023-02-03: qty 2

## 2023-02-03 MED ORDER — HEPARIN (PORCINE) IN NACL 2000-0.9 UNIT/L-% IV SOLN
INTRAVENOUS | Status: DC | PRN
  Administered 2023-02-03: 1000 mL

## 2023-02-03 MED ORDER — PHENYLEPHRINE 80 MCG/ML (10ML) SYRINGE FOR IV PUSH (FOR BLOOD PRESSURE SUPPORT)
PREFILLED_SYRINGE | INTRAVENOUS | Status: DC | PRN
  Administered 2023-02-03 (×4): 80 ug via INTRAVENOUS
  Administered 2023-02-03: 160 ug via INTRAVENOUS
  Administered 2023-02-03: 80 ug via INTRAVENOUS

## 2023-02-03 MED ORDER — ROCURONIUM BROMIDE 10 MG/ML (PF) SYRINGE
PREFILLED_SYRINGE | INTRAVENOUS | Status: DC | PRN
  Administered 2023-02-03: 70 mg via INTRAVENOUS

## 2023-02-03 MED ORDER — FENTANYL CITRATE (PF) 100 MCG/2ML IJ SOLN
INTRAMUSCULAR | Status: AC
  Filled 2023-02-03: qty 2

## 2023-02-03 MED ORDER — HEPARIN SODIUM (PORCINE) 1000 UNIT/ML IJ SOLN
INTRAMUSCULAR | Status: DC | PRN
  Administered 2023-02-03: 12000 [IU] via INTRAVENOUS

## 2023-02-03 MED ORDER — LACTATED RINGERS IV SOLN: INTRAVENOUS | Status: DC

## 2023-02-03 MED ORDER — SODIUM CHLORIDE 0.9 % IV SOLN: INTRAVENOUS | Status: DC

## 2023-02-03 MED ORDER — PHENYLEPHRINE HCL-NACL 20-0.9 MG/250ML-% IV SOLN
INTRAVENOUS | Status: DC | PRN
  Administered 2023-02-03: 25 ug/min via INTRAVENOUS

## 2023-02-03 MED ORDER — ACETAMINOPHEN 500 MG PO TABS
1000.0000 mg | ORAL_TABLET | Freq: Three times a day (TID) | ORAL | Status: DC | PRN
  Administered 2023-02-03: 1000 mg via ORAL
  Filled 2023-02-03: qty 2

## 2023-02-03 MED ORDER — MIDAZOLAM HCL 2 MG/2ML IJ SOLN
INTRAMUSCULAR | Status: DC | PRN
  Administered 2023-02-03: 2 mg via INTRAVENOUS

## 2023-02-03 MED ORDER — ONDANSETRON HCL 4 MG/2ML IJ SOLN: 4.0000 mg | Freq: Four times a day (QID) | INTRAMUSCULAR | Status: DC | PRN

## 2023-02-03 MED ORDER — IOHEXOL 350 MG/ML SOLN
INTRAVENOUS | Status: DC | PRN
  Administered 2023-02-03 (×2): 10 mL

## 2023-02-03 MED ORDER — PROTAMINE SULFATE 10 MG/ML IV SOLN
INTRAVENOUS | Status: DC | PRN
  Administered 2023-02-03: 20 mg via INTRAVENOUS
  Administered 2023-02-03: 10 mg via INTRAVENOUS

## 2023-02-03 MED ORDER — CHLORHEXIDINE GLUCONATE 4 % EX LIQD: Freq: Once | CUTANEOUS | Status: DC

## 2023-02-03 MED ORDER — SODIUM CHLORIDE 0.9% FLUSH: 3.0000 mL | Freq: Two times a day (BID) | INTRAVENOUS | Status: DC

## 2023-02-03 MED ORDER — FENTANYL CITRATE (PF) 250 MCG/5ML IJ SOLN
INTRAMUSCULAR | Status: DC | PRN
  Administered 2023-02-03: 100 ug via INTRAVENOUS

## 2023-02-03 MED ORDER — SUGAMMADEX SODIUM 200 MG/2ML IV SOLN
INTRAVENOUS | Status: DC | PRN
  Administered 2023-02-03: 200 mg via INTRAVENOUS

## 2023-02-03 MED ORDER — CHLORHEXIDINE GLUCONATE 0.12 % MT SOLN
OROMUCOSAL | Status: AC
  Administered 2023-02-03: 15 mL
  Filled 2023-02-03: qty 15

## 2023-02-03 MED ORDER — ONDANSETRON HCL 4 MG/2ML IJ SOLN
INTRAMUSCULAR | Status: DC | PRN
  Administered 2023-02-03: 4 mg via INTRAVENOUS

## 2023-02-03 SURGICAL SUPPLY — 23 items
BAG SNAP BAND KOVER 36X36 (MISCELLANEOUS) IMPLANT
BLANKET WARM UNDERBOD FULL ACC (MISCELLANEOUS) ×2 IMPLANT
CATH DIAG 6FR PIGTAIL ANGLED (CATHETERS) IMPLANT
CLOSURE PERCLOSE PROSTYLE (VASCULAR PRODUCTS) IMPLANT
DEVICE WATCHMAN FLX PRO PROC (KITS) IMPLANT
DEVICE WATCHMAN FXD CRV PROC (KITS) IMPLANT
DILATOR VESSEL 38 20CM 12FR (INTRODUCER) IMPLANT
GUIDEWIRE INQWIRE 1.5J.035X260 (WIRE) ×2 IMPLANT
INQWIRE 1.5J .035X260CM (WIRE) ×1
KIT HEART LEFT (KITS) ×2 IMPLANT
KIT SHEA VERSACROSS LAAC CONNE (KITS) IMPLANT
PACK CARDIAC CATHETERIZATION (CUSTOM PROCEDURE TRAY) ×2 IMPLANT
PAD DEFIB RADIO PHYSIO CONN (PAD) ×2 IMPLANT
SHEATH PERFORMER 16FR 30 (SHEATH) IMPLANT
SHEATH PINNACLE 8F 10CM (SHEATH) IMPLANT
SHEATH PROBE COVER 6X72 (BAG) ×2 IMPLANT
SYS WATCHMAN FXD DBL (SHEATH) ×1
SYSTEM WATCHMAN FXD DBL (SHEATH) IMPLANT
TRANSDUCER W/STOPCOCK (MISCELLANEOUS) ×2 IMPLANT
TUBING CIL FLEX 10 FLL-RA (TUBING) ×2 IMPLANT
WATCHMAN FLX 24 (Prosthesis & Implant Heart) IMPLANT
WATCHMAN FLX PRO PROCEDURE (KITS) ×1 IMPLANT
WATCHMAN FXD CRV SYS PROCEDURE (KITS) ×1 IMPLANT

## 2023-02-03 NOTE — Discharge Instructions (Signed)
Justin [name] Procedure, Care After  Procedure MD: Dr. Lambert Watchman Clinical Coordinator: Katy Kemp, RN  This sheet gives you information about how to care for yourself after your procedure. Your health care provider may also give you more specific instructions. If you have problems or questions, contact your health care provider.  What can I expect after the procedure? After the procedure, it is common to have: Bruising around your puncture site. Tenderness around your puncture site. Tiredness (fatigue).  Medication instructions It is very important to continue to take your blood thinner as directed by your doctor after the Watchman procedure. Call your procedure doctor's office with question or concerns. If you are on Coumadin (warfarin), you will have your INR checked the week after your procedure, with a goal INR of 2.0 - 3.0. Please follow your medication instructions on your discharge summary. Only take the medications listed on your discharge paperwork.  Follow up You will be seen in 1 month after your procedure  You will have a repeat CT scan approximately 8 weeks after your procedure mark to check your device You will follow up the MD/APP who performed your procedure 6 months after your procedure The Watchman Clinical Coordinator will check in with you from time to time, including 1 and 2 years after your procedure.    Follow these instructions at home: Puncture site care  Follow instructions from your health care provider about how to take care of your puncture site. Make sure you: If present, leave stitches (sutures), skin glue, or adhesive strips in place.  If a large square bandage is present, this may be removed 24 hours after surgery.  Check your puncture site every day for signs of infection. Check for: Redness, swelling, or pain. Fluid or blood. If your puncture site starts to bleed, lie down on your back, apply firm pressure to the area, and contact your health  care provider. Warmth. Pus or a bad smell. Driving Do not drive yourself home if you received sedation Do not drive for at least 4 days after your procedure or however long your health care provider recommends. (Do not resume driving if you have previously been instructed not to drive for other health reasons.) Do not spend greater than 1 hour at a time in a car for the first 3 days. Stop and take a break with a 5 minute walk at least every hour.  Do not drive or use heavy machinery while taking prescription pain medicine.  Activity Avoid activities that take a lot of effort, including exercise, for at least 7 days after your procedure. For the first 3 days, avoid sitting for longer than one hour at a time.  Avoid alcoholic beverages, signing paperwork, or participating in legal proceedings for 24 hours after receiving sedation Do not lift anything that is heavier than 10 lb (4.5 kg) for one week.  No sexual activity for 1 week.  Return to your normal activities as told by your health care provider. Ask your health care provider what activities are safe for you. General instructions Take over-the-counter and prescription medicines only as told by your health care provider. Do not use any products that contain nicotine or tobacco, such as cigarettes and e-cigarettes. If you need help quitting, ask your health care provider. You may shower after 24 hours, but Do not take baths, swim, or use a hot tub for 1 week.  Do not drink alcohol for 24 hours after your procedure. Keep all follow-up visits as told by   your health care provider. This is important. Dental Work: You will require antibiotics prior to any dental work, including cleanings, for 6 months after your Watchman implantation to help protect you from infection. After 6 months, antibiotics are no longer required. Contact a health care provider if: You have redness, mild swelling, or pain around your puncture site. You have soreness in  your throat or at your puncture site that does not improve after several days You have fluid or blood coming from your puncture site that stops after applying firm pressure to the area. Your puncture site feels warm to the touch. You have pus or a bad smell coming from your puncture site. You have a fever. You have chest pain or discomfort that spreads to your neck, jaw, or arm. You are sweating a lot. You feel nauseous. You have a fast or irregular heartbeat. You have shortness of breath. You are dizzy or light-headed and feel the need to lie down. You have pain or numbness in the arm or leg closest to your puncture site. Get help right away if: Your puncture site suddenly swells. Your puncture site is bleeding and the bleeding does not stop after applying firm pressure to the area. These symptoms may represent a serious problem that is an emergency. Do not wait to see if the symptoms will go away. Get medical help right away. Call your local emergency services (911 in the [place] Do not drive yourself to the hospital. Summary After the procedure, it is normal to have bruising and tenderness at the puncture site in your groin, neck, or forearm. Check your puncture site every day for signs of infection. Get help right away if your puncture site is bleeding and the bleeding does not stop after applying firm pressure to the area. This is a medical emergency.  This information is not intended to replace advice given to you by your health care provider. Make sure you discuss any questions you have with your health care provider.   

## 2023-02-03 NOTE — Progress Notes (Signed)
Went over discharge paperwork with patient. All questions answered. PIVs and telemetry removed. All belongings at bedside.

## 2023-02-03 NOTE — Anesthesia Procedure Notes (Signed)
Procedure Name: Intubation Date/Time: 02/03/2023 8:58 AM  Performed by: Garfield Cornea, CRNAPre-anesthesia Checklist: Patient identified, Emergency Drugs available, Suction available and Patient being monitored Patient Re-evaluated:Patient Re-evaluated prior to induction Oxygen Delivery Method: Circle System Utilized Preoxygenation: Pre-oxygenation with 100% oxygen Induction Type: IV induction Ventilation: Mask ventilation without difficulty and Oral airway inserted - appropriate to patient size Laryngoscope Size: Mac and 4 Grade View: Grade I Tube type: Oral Tube size: 7.5 mm Number of attempts: 1 Airway Equipment and Method: Stylet and Oral airway Placement Confirmation: ETT inserted through vocal cords under direct vision, positive ETCO2 and breath sounds checked- equal and bilateral Secured at: 22 cm Tube secured with: Tape Dental Injury: Teeth and Oropharynx as per pre-operative assessment

## 2023-02-03 NOTE — Anesthesia Postprocedure Evaluation (Signed)
Anesthesia Post Note  Patient: Justin [name]  Procedure(s) Performed: LEFT ATRIAL APPENDAGE OCCLUSION TRANSESOPHAGEAL ECHOCARDIOGRAM     Patient location during evaluation: PACU Anesthesia Type: General Level of consciousness: awake and alert, patient cooperative and oriented Pain management: pain level controlled Vital Signs Assessment: post-procedure vital signs reviewed and stable Respiratory status: spontaneous breathing, nonlabored ventilation, respiratory function stable and patient connected to nasal cannula oxygen Cardiovascular status: blood pressure returned to baseline, stable and bradycardic Postop Assessment: no apparent nausea or vomiting Anesthetic complications: no   No notable events documented.  Last Vitals:  Vitals:   02/03/23 1043 02/03/23 1110  BP:  109/69  Pulse: (!) 59   Resp:  12  Temp:  36.7 C  SpO2:  90%    Last Pain:  Vitals:   02/03/23 1111  TempSrc:   PainSc: 2                  Arial Galligan,E. Pierrette Scheu

## 2023-02-03 NOTE — Transfer of Care (Signed)
Immediate Anesthesia Transfer of Care Note  Patient: Angelus Hoopes Bocock  Procedure(s) Performed: LEFT ATRIAL APPENDAGE OCCLUSION TRANSESOPHAGEAL ECHOCARDIOGRAM  Patient Location: Cath Lab  Anesthesia Type:General  Level of Consciousness: awake, alert , and oriented  Airway & Oxygen Therapy: Patient Spontanous Breathing and Patient connected to nasal cannula oxygen  Post-op Assessment: Report given to RN and Post -op Vital signs reviewed and stable  Post vital signs: Reviewed and stable  Last Vitals:  Vitals Value Taken Time  BP 106/57 02/03/23 1035  Temp    Pulse 51 02/03/23 1036  Resp 15 02/03/23 1036  SpO2 94 % 02/03/23 1036  Vitals shown include unvalidated device data.  Last Pain:  Vitals:   02/03/23 1001  TempSrc: Temporal  PainSc: 0-No pain         Complications: No notable events documented.

## 2023-02-03 NOTE — H&P (Signed)
Electrophysiology Office Follow up Visit Note:     Date:  02/03/2023    ID:  Justin [name], DOB Jun 17, 1951, MRN [id number]   PCP:  Charlane Ferretti, DO        [facility] Cardiologist:  Chrystie Nose, MD  [facility] Electrophysiologist:  Lanier Prude, MD      Interval History:     Justin [name] is a 72 y.o. male who presents for a follow up visit.    Presented for Watchman implant in the past but was cancelled after under anaesthesia due to extreme hypertension.    Last seen 11/04/2022. He was previously scheduled for a Watchman but this was cancelled due to AF w/ RVR prior to the procedure day at an appointment with Noreene Larsson.    He is currently scheduled for Watchman 02/03/2022.    Today, he appears well. His blood pressure is well controlled in clinic today.   He denies any issues with LE edema. He is taking Lasix every day and his weight has been stable.   He denies any palpitations, chest pain, shortness of breath, or peripheral edema. No lightheadedness, headaches, syncope, orthopnea, or PND.    Presents for [employer] implant today. BP better controlled. Procedure reviewed.   Objective  Past medical, surgical, social and family history were reviewed.   ROS:   Please see the history of present illness.    All other systems reviewed and are negative.   EKGs/Labs/Other Studies Reviewed:     The following studies were reviewed today:   EKG:  The ekg ordered today demonstrates atrial flutter, right bundle branch block, ventricular rate 64 bpm, left anterior fascicular block     Physical Exam:     VS:  BP 106/66   Pulse 63   Ht 5\' 11"  (1.803 m)   Wt 192 lb (87.1 kg)   SpO2 98%   BMI 26.78 kg/m         Wt Readings from Last 3 Encounters:  12/11/22 192 lb (87.1 kg)  11/04/22 200 lb (90.7 kg)  10/23/22 194 lb 0.1 oz (88 kg)      GEN: Well nourished, well developed in no acute distress CARDIAC: Irregularly irregular, no murmurs, rubs,  gallops RESPIRATORY:  Clear to auscultation without rales, wheezing or rhonchi          Assessment ASSESSMENT:     1. Permanent atrial fibrillation (HCC)   2. Primary hypertension   3. At high risk for bleeding   4. Pre-procedure lab exam     PLAN:     In order of problems listed above:   #Permanent AF Rate controlled. On OAC but would like to avoid long term exposure to [employer] given elevated bleeding risk.    have seen [name] in the office today who is being considered for a Watchman left atrial appendage closure device. I believe they will benefit from this procedure given their history of atrial fibrillation, CHA2DS2-VASc score of 3 and unadjusted ischemic stroke rate of 3.2% per year. Unfortunately, the patient is not felt to be a long term anticoagulation candidate secondary to concerns re: increased bleeding risk. The patient's chart has been reviewed and I feel that they would be a candidate for short term oral anticoagulation after Watchman implant.    It is my belief that after undergoing a LAA closure procedure, Justin [name] will not need long term anticoagulation which eliminates anticoagulation side effects and major bleeding risk.  Procedural risks for the Watchman implant have been reviewed with the patient including a 0.5% risk of stroke, <1% risk of perforation and <1% risk of device embolization. Other risks include bleeding, vascular damage, tamponade, worsening renal function, and death. The patient understands these risk and wishes to proceed.       The published clinical data on the safety and effectiveness of WATCHMAN include but are not limited to the following: - Holmes DR, Everlene Farrier, Sick P et al. for the PROTECT AF Investigators. Percutaneous closure of the left atrial appendage versus warfarin therapy for prevention of stroke in patients with atrial fibrillation: a randomised non-inferiority trial. Lancet 2009; 374: 534-42. Everlene Farrier, Doshi SK,  Isa Rankin D et al. on behalf of the PROTECT AF Investigators. Percutaneous Left Atrial Appendage Closure for Stroke Prophylaxis in Patients With Atrial Fibrillation 2.3-Year Follow-up of the PROTECT AF (Watchman Left Atrial Appendage System for Embolic Protection in Patients With Atrial Fibrillation) Trial. Circulation 2013; 127:720-729. - Alli O, Doshi S,  Kar S, Reddy VY, Sievert H et al. Quality of Life Assessment in the Randomized PROTECT AF (Percutaneous Closure of the Left Atrial Appendage Versus Warfarin Therapy for Prevention of Stroke in Patients With Atrial Fibrillation) Trial of Patients at Risk for Stroke With Nonvalvular Atrial Fibrillation. J Am Coll Cardiol 2013; 61:1790-8. Aline August DR, Mia [place], Price M, Whisenant B, Sievert H, Doshi S, Huber K, Reddy V. Prospective randomized evaluation of the Watchman left atrial appendage Device in patients with atrial fibrillation versus long-term warfarin therapy; the PREVAIL trial. Journal of the [employer] of Cardiology, Vol. 4, No. 1, 2014, 1-11. - Kar S, Doshi SK, Sadhu A, Horton R, Osorio J et al. Primary outcome evaluation of a next-generation left atrial appendage closure device: results from the PINNACLE FLX trial. Circulation 2021;143(18)1754-1762.      After today's visit with the patient which was dedicated solely for shared decision making visit regarding LAA closure device, the patient decided to proceed with the LAA appendage closure procedure scheduled to be done in the near future at [facility]. Recurrence   HAS-BLED score 2 Hypertension Yes  Abnormal renal and liver function (Dialysis, transplant, Cr >2.26 mg/dL /Cirrhosis or Bilirubin >2x Normal or AST/ALT/AP >3x Normal) No  Stroke No  Bleeding No  Labile INR (Unstable/high INR) No  Elderly (>65) Yes  Drugs or alcohol (? 8 drinks/week, anti-plt or NSAID) No    CHA2DS2-VASc Score = 3  The patient's score is based upon: CHF History: 1 HTN History:  1 Diabetes History: 0 Stroke History: 0 Vascular Disease History: 0 Age Score: 1 Gender Score: 0  Presents for Watchman implant today.   Signed, Steffanie Dunn, MD, [facility], [facility] 02/03/2023 Electrophysiology [facility]

## 2023-02-03 NOTE — Progress Notes (Signed)
Pt arriving to holding are with significant oozing at insertion site. Manual pressure held for 15 minutes. Oozing appears to subside

## 2023-02-03 NOTE — Discharge Summary (Signed)
[facility]    Patient ID: Justin [name],  MRN: [id number], DOB/AGE: 70-Aug-1952 70 y.o.  Admit date: 02/03/2023 Discharge date: 02/03/2023  Primary Care Physician: Charlane Ferretti, DO  Primary Cardiologist: Chrystie Nose, MD  Electrophysiologist: Lanier Prude, MD  Primary Discharge Diagnosis:  Persistent Atrial Fibrillation Poor candidacy for long term anticoagulation due to  excessive bruising  Procedures This Admission:  Transeptal Puncture Intra-procedural TEE which showed no LAA thrombus Left atrial appendage occlusive device placement on 6/26 by Dr. Lalla Brothers.   Brief HPI: Justin HENRICKSEN is a 72 y.o. male with a history of spondylitis, gout, HTN, RBBB, CKD stage IIIb, chronic diastolic CHF, persistent afib (failed DCCV in 12/2021) with excessive bruising who presented for LAAO closure with Watchman 02/03/23.    [name] was referred to Dr .Rennis Golden in 12/2021 for evaluation of new onset atrial fibrillation. He reported fatigue and some shortness of breath. His metoprolol was increased and he was given samples of Xarelto given issues with cost. He also noted issues with excessive bruising. Echo 05/05/22 showed EF 65-70%, severe LAE, mild thickening of AoV and mild aortic dilation.    He underwent DCCV 12/24/2021. He received 3 shocks at 200 J. He converted to 4-5 beats of NSR before returning to atrial fibrillation.    He was seen by Dr. Lalla Brothers for consideration of LAAO. Cardiac CT 06/22/22 showed severe biatrial enlargement with a chicken wing appendage average diameter 19.8 mm depth 22 mm suitable for a 24 mm Watchman FLX device as well as an elevated coronary calcium score. He was scheduled for LAAO on 09/03/22 but this was ultimately cancelled due to uncontrolled hypertension and medications were adjusted. He then developed afib with RVR and acute CHF and was eventually stabilized with adjustments in diuretics.   Hospital Course:  The patient was admitted  and underwent left atrial appendage occlusive device placement with 24mm Watchman FLX Pro device. He was monitored in the post procedure setting and has done very well with no concerns. Given this, he/she is being considered for same day discharge later today. Groin site has been stable without evidence of hematoma or bleeding. Wound care and restrictions were reviewed with the patient. The patient has been scheduled for post procedure follow up with Georgie Chard, NP in approximately 1 month. Medication plan will be to restart Xarelto this evening and continue for 45 days. He will then stop and start Plavix 75mg  every day to complete a total duration of 6 months. Given his poor renal function and low GFR, he has been scheduled for post implant TEE scheduled for 9/4 with Dr. Jacques Navy. He will require 6 months of dental SBE. This will be further discussed at his follow up appointment.     Chronic diastolic CHF: Appears euvolemic. Continue lasix 40mg  QOD.   HTN: Stable today with no home medication changes.    CKD stage IIIb: Continue to follow closely. Planning post Watchman TEE   Physical Exam: Vitals:   02/03/23 1038 02/03/23 1040 02/03/23 1043 02/03/23 1110  BP:  110/62  109/69  Pulse: 65 (!) 51 (!) 59   Resp:  12  12  Temp:    98 F (36.7 C)  TempSrc:    Oral  SpO2:  94%  90%  Weight:      Height:       Labs:   Lab Results  Component Value Date   WBC 5.9 01/13/2023   HGB 12.9 (L) 01/13/2023  HCT 37.4 (L) 01/13/2023   MCV 100 (H) 01/13/2023   PLT 210 01/13/2023    Recent Labs  Lab 02/03/23 0806  NA 139  K 3.6  CL 101  CO2 21*  BUN 46*  CREATININE 2.55*  CALCIUM 9.4  GLUCOSE 90   Discharge Medications:  Allergies as of 02/03/2023       Reactions   Indocin [indomethacin] Other (See Comments)   Severe headaches.    Norco [hydrocodone-acetaminophen] Nausea And Vomiting        Medication List     TAKE these medications    albuterol 108 (90 Base) MCG/ACT  inhaler Commonly known as: VENTOLIN HFA Inhale 2 puffs into the lungs every 4 (four) hours as needed for wheezing or shortness of breath.   amiodarone 200 MG tablet Commonly known as: PACERONE Take 1 tablet (200 mg total) by mouth daily.   cholecalciferol 25 MCG (1000 UNIT) tablet Commonly known as: VITAMIN D3 Take 1,000 Units by mouth daily.   diltiazem 240 MG 24 hr capsule Commonly known as: CARDIZEM CD Take 1 capsule (240 mg total) by mouth daily.   furosemide 40 MG tablet Commonly known as: LASIX Take 1 tablet (40 mg total) by mouth every other day.   hydrALAZINE 50 MG tablet Commonly known as: APRESOLINE Take 1 tablet (50 mg total) by mouth 3 (three) times daily.   ibuprofen 200 MG tablet Commonly known as: ADVIL Take 400 mg by mouth daily as needed for mild pain or moderate pain.   metoprolol succinate 100 MG 24 hr tablet Commonly known as: TOPROL-XL TAKE 1 TABLET BY MOUTH EVERY DAY   multivitamin with minerals tablet Take 1 tablet by mouth daily.   Narcan 4 MG/0.1ML Liqd nasal spray kit Generic drug: naloxone Take by nasal route every 3 minutes until patient awakes or EMS arrives.   omeprazole 40 MG capsule Commonly known as: PRILOSEC Take 40 mg by mouth daily.   OVER THE COUNTER MEDICATION Apply 1 application  topically daily. CBD Oil   OxyCONTIN 30 MG 12 hr tablet Generic drug: oxyCODONE Take 30 mg by mouth every 12 (twelve) hours.   rivaroxaban 20 MG Tabs tablet Commonly known as: XARELTO Take 20 mg by mouth at bedtime. Notes to patient: Please restart your Xarelto this evening, 02/03/23.    tiZANidine 4 MG tablet Commonly known as: ZANAFLEX Take 4 mg by mouth in the morning and at bedtime.   VITAMIN C PO Take 1,000 mg by mouth daily.        Disposition:  Home  Discharge Instructions     Call MD for:  difficulty breathing, headache or visual disturbances   Complete by: As directed    Call MD for:  extreme fatigue   Complete by: As  directed    Call MD for:  hives   Complete by: As directed    Call MD for:  persistant dizziness or light-headedness   Complete by: As directed    Call MD for:  persistant nausea and vomiting   Complete by: As directed    Call MD for:  redness, tenderness, or signs of infection (pain, swelling, redness, odor or green/yellow discharge around incision site)   Complete by: As directed    Call MD for:  severe uncontrolled pain   Complete by: As directed    Call MD for:  temperature >100.4   Complete by: As directed    Diet - low sodium heart healthy   Complete by: As directed  Discharge instructions   Complete by: As directed    [facility] Procedure, Care After  Procedure MD: Dr. Isidoro Donning Clinical Coordinator: Karsten Fells, RN  This sheet gives you information about how to care for yourself after your procedure. Your health care provider may also give you more specific instructions. If you have problems or questions, contact your health care provider.  What can I expect after the procedure? After the procedure, it is common to have: Bruising around your puncture site. Tenderness around your puncture site. Tiredness (fatigue).  Medication instructions It is very important to continue to take your blood thinner as directed by your doctor after the Watchman procedure. Call your procedure doctor's office with question or concerns. If you are on Coumadin (warfarin), you will have your INR checked the week after your procedure, with a goal INR of 2.0 - 3.0. Please follow your medication instructions on your discharge summary. Only take the medications listed on your discharge paperwork.  Follow up You will be seen in 1 month after your procedure You will have a repeat CT scan approximately 8 weeks after your procedure mark to check your device You will follow up the MD/APP who performed your procedure 6 months after your procedure The Watchman Clinical Coordinator will check in  with you from time to time, including 1 and 2 years after your procedure.    Follow these instructions at home: Puncture site care  Follow instructions from your health care provider about how to take care of your puncture site. Make sure you: If present, leave stitches (sutures), skin glue, or adhesive strips in place.  If a large square bandage is present, this may be removed 24 hours after surgery.  Check your puncture site every day for signs of infection. Check for: Redness, swelling, or pain. Fluid or blood. If your puncture site starts to bleed, lie down on your back, apply firm pressure to the area, and contact your health care provider. Warmth. Pus or a bad smell. Driving Do not drive yourself home if you received sedation Do not drive for at least 4 days after your procedure or however long your health care provider recommends. (Do not resume driving if you have previously been instructed not to drive for other health reasons.) Do not spend greater than 1 hour at a time in a car for the first 3 days. Stop and take a break with a 5 minute walk at least every hour.  Do not drive or use heavy machinery while taking prescription pain medicine.  Activity Avoid activities that take a lot of effort, including exercise, for at least 7 days after your procedure. For the first 3 days, avoid sitting for longer than one hour at a time.  Avoid alcoholic beverages, signing paperwork, or participating in legal proceedings for 24 hours after receiving sedation Do not lift anything that is heavier than 10 lb (4.5 kg) for one week.  No sexual activity for 1 week.  Return to your normal activities as told by your health care provider. Ask your health care provider what activities are safe for you. General instructions Take over-the-counter and prescription medicines only as told by your health care provider. Do not use any products that contain nicotine or tobacco, such as cigarettes and  e-cigarettes. If you need help quitting, ask your health care provider. You may shower after 24 hours, but Do not take baths, swim, or use a hot tub for 1 week.  Do not drink alcohol for 24  hours after your procedure. Keep all follow-up visits as told by your health care provider. This is important. Dental Work: You will require antibiotics prior to any dental work, including cleanings, for 6 months after your Watchman implantation to help protect you from infection. After 6 months, antibiotics are no longer required. Contact a health care provider if: You have redness, mild swelling, or pain around your puncture site. You have soreness in your throat or at your puncture site that does not improve after several days You have fluid or blood coming from your puncture site that stops after applying firm pressure to the area. Your puncture site feels warm to the touch. You have pus or a bad smell coming from your puncture site. You have a fever. You have chest pain or discomfort that spreads to your neck, jaw, or arm. You are sweating a lot. You feel nauseous. You have a fast or irregular heartbeat. You have shortness of breath. You are dizzy or light-headed and feel the need to lie down. You have pain or numbness in the arm or leg closest to your puncture site. Get help right away if: Your puncture site suddenly swells. Your puncture site is bleeding and the bleeding does not stop after applying firm pressure to the area. These symptoms may represent a serious problem that is an emergency. Do not wait to see if the symptoms will go away. Get medical help right away. Call your local emergency services (911 in the [place] Do not drive yourself to the hospital. Summary After the procedure, it is normal to have bruising and tenderness at the puncture site in your groin, neck, or forearm. Check your puncture site every day for signs of infection. Get help right away if your puncture site is bleeding  and the bleeding does not stop after applying firm pressure to the area. This is a medical emergency.  This information is not intended to replace advice given to you by your health care provider. Make sure you discuss any questions you have with your health care provider.   Increase activity slowly   Complete by: As directed        Follow-up Information     Filbert Schilder, NP Follow up on 03/10/2023.   Specialty: Cardiology Why: @ 9:05am. Please arrive at 8:55am for your appointment. Contact information: [address] [place] [address] [phone]                Duration of Discharge Encounter: Greater than 30 minutes including physician time.  Signed, Georgie Chard, NP  02/03/2023 2:34 PM

## 2023-02-04 ENCOUNTER — Encounter (HOSPITAL_COMMUNITY): Payer: Self-pay | Admitting: Cardiology

## 2023-02-04 ENCOUNTER — Telehealth: Payer: Self-pay | Admitting: Cardiology

## 2023-02-04 DIAGNOSIS — I4891 Unspecified atrial fibrillation: Secondary | ICD-10-CM

## 2023-02-04 NOTE — Telephone Encounter (Signed)
  [facility]   Watchman Team  Contacted the patient regarding discharge from [facility] on 6/26  The patient understands to follow up with Georgie Chard NP-C on 7/31 in preparation for TEE imaging on 9/4 with Dr. Jacques Navy.  The patient understands discharge instructions? Yes  The patient understands medications and regimen? Yes   The patient reports groin site looks stable with no concerns   The patient understands to call with any questions or concerns prior to scheduled visit.

## 2023-02-09 LAB — POCT ACTIVATED CLOTTING TIME: Activated Clotting Time: 495 seconds

## 2023-03-09 NOTE — Progress Notes (Unsigned)
[facility]                                     Cardiology Office Note:    Date:  03/11/2023   ID:  SHAUGHN BJORKLUND, DOB Dec 08, 1950, MRN [id number]  PCP:  Charlane Ferretti, DO  [facility] Cardiologist:  Chrystie Nose, MD  [facility] Electrophysiologist:  Lanier Prude, MD   Referring MD: Charlane Ferretti, DO   Chief Complaint  Patient presents with   Follow-up    1 month s/p LAAO    History of Present Illness:    Justin [name] is a 72 y.o. male with a hx of spondylitis, gout, HTN, RBBB, CKD stage IIIb, chronic diastolic CHF, persistent afib (failed DCCV in 12/2021) with excessive bruising who is now s/p LAAO closure with Watchman 02/03/23 and is being seen today for 1 month follow up.    Justin [name] was referred to Dr .Rennis Golden in 12/2021 for evaluation of new onset atrial fibrillation. He reported fatigue and some shortness of breath. His metoprolol was increased and he was given samples of Xarelto given issues with cost. He also noted issues with excessive bruising. Echo 05/05/22 showed EF 65-70%, severe LAE, mild thickening of AoV and mild aortic dilation.    He underwent DCCV 12/24/2021. He received 3 shocks at 200 J. He converted to 4-5 beats of NSR before returning to atrial fibrillation.    He was seen by Dr. Lalla Brothers for consideration of LAAO. Cardiac CT 06/22/22 showed severe biatrial enlargement with a chicken wing appendage average diameter 19.8 mm depth 22 mm suitable for a 24 mm Watchman FLX device as well as an elevated coronary calcium score. He was scheduled for LAAO on 09/03/22 but this was ultimately cancelled due to uncontrolled hypertension and medications were adjusted. He then developed afib with RVR and acute CHF and was eventually stabilized with adjustments in diuretics.   He is now s/p LAAO with 24mm Watchman FLX Pro device on 02/03/23. He was restarted on Xarelto to complete 45 days (8/10). He will then stop and start Plavix 75mg  (8/11) for 6  months. Given his poor renal function and low GFR, he was scheduled for post implant TEE 9/4 with Dr. Jacques Navy.   Today he is here alone and reports that he has been doing very well since discharge. BP today looks great and he only has trace right ankle edema on exam. He denies chest pain, SOB, palpitations, orthopnea, PND, dizziness, or syncope. Denies bleeding in stool or urine.   Past Medical History:  Diagnosis Date   Angioedema 03/28/2012   "tongue and throat"- was never known why"was taking humira at the time.   Arthritis    "hands""back"   Asthma    treated for over 103yrs ago   Atrial fibrillation (HCC)    Chronic back pain    scoliosis/spondylosis   Complication of anesthesia    may try fight when waking up   History of bronchitis 10+yrs ago   History of colon polyps    History of gout    last time many yrs ago   History of kidney stones    Joint pain    Joint swelling    Lyme disease    just completed treatment with Doxycycline    Pneumonia 08/10/2013   Presence of Watchman left atrial appendage closure device 02/03/2023   24mm Watchman FLX  Pro placed by Dr. Lalla Brothers   Spondylitis, ankylosing (HCC)    Weakness    and numbness in right leg    Past Surgical History:  Procedure Laterality Date   APPENDECTOMY     BILIARY DILATION  07/25/2020   Procedure: BILIARY DILATION;  Surgeon: Vida Rigger, MD;  Location: [facility] ENDOSCOPY;  Service: Endoscopy;;   CARDIOVERSION N/A 12/24/2021   Procedure: CARDIOVERSION;  Surgeon: Sande Rives, MD;  Location: [facility] ENDOSCOPY;  Service: Cardiovascular;  Laterality: N/A;   CARDIOVERSION N/A 10/23/2022   Procedure: CARDIOVERSION;  Surgeon: Lewayne Bunting, MD;  Location: [facility] ENDOSCOPY;  Service: Cardiovascular;  Laterality: N/A;   CHOLECYSTECTOMY     COLONOSCOPY     ERCP N/A 07/25/2020   Procedure: ENDOSCOPIC RETROGRADE CHOLANGIOPANCREATOGRAPHY (ERCP);  Surgeon: Vida Rigger, MD;  Location: [facility] ENDOSCOPY;  Service: Endoscopy;   Laterality: N/A;   ESOPHAGOGASTRODUODENOSCOPY     knuckle surgery     right middle finger a couple of times   LEFT ATRIAL APPENDAGE OCCLUSION N/A 02/03/2023   Procedure: LEFT ATRIAL APPENDAGE OCCLUSION;  Surgeon: Lanier Prude, MD;  Location: [facility] INVASIVE CV LAB;  Service: Cardiovascular;  Laterality: N/A;   LUMBAR LAMINECTOMY/DECOMPRESSION MICRODISCECTOMY Right 01/11/2013   Procedure: CENTRAL DECOMPRESSION LUMBAR LAMINECTOMYL4-L5 RIGHT, MICRODISCECTOMY L4-L5 RIGHT     (1 LEVEL);  Surgeon: Jacki Cones, MD;  Location: [facility] ORS;  Service: Orthopedics;  Laterality: Right;   REMOVAL OF STONES  07/25/2020   Procedure: REMOVAL OF STONES;  Surgeon: Vida Rigger, MD;  Location: [facility] ENDOSCOPY;  Service: Endoscopy;;   SEPTOPLASTY     SHOULDER OPEN ROTATOR CUFF REPAIR Left    x 2;impingement syndrome and torn rotator cuff   SPHINCTEROTOMY  07/25/2020   Procedure: SPHINCTEROTOMY;  Surgeon: Vida Rigger, MD;  Location: [facility] ENDOSCOPY;  Service: Endoscopy;;   SPINAL CORD STIMULATOR INSERTION N/A 01/04/2014   Procedure: LUMBAR SPINAL CORD STIMULATOR INSERTION;  Surgeon: Venita Lick, MD;  Location: [facility] OR;  Service: Orthopedics;  Laterality: N/A;   TEE WITHOUT CARDIOVERSION N/A 09/03/2022   Procedure: TRANSESOPHAGEAL ECHOCARDIOGRAM (TEE);  Surgeon: Lanier Prude, MD;  Location: [facility] INVASIVE CV LAB;  Service: Cardiovascular;  Laterality: N/A;   TEE WITHOUT CARDIOVERSION N/A 02/03/2023   Procedure: TRANSESOPHAGEAL ECHOCARDIOGRAM;  Surgeon: Lanier Prude, MD;  Location: [facility] INVASIVE CV LAB;  Service: Cardiovascular;  Laterality: N/A;   TONSILLECTOMY      Current Medications: Current Meds  Medication Sig   albuterol (VENTOLIN HFA) 108 (90 Base) MCG/ACT inhaler Inhale 2 puffs into the lungs every 4 (four) hours as needed for wheezing or shortness of breath.   amiodarone (PACERONE) 200 MG tablet Take 1 tablet (200 mg total) by mouth daily.   amoxicillin (AMOXIL) 500 MG tablet Start Amoxicillin 500 mg,  take 4 tablets by mouth 1 hour prior to dental procedures and cleanings.   Ascorbic Acid (VITAMIN C PO) Take 1,000 mg by mouth daily.   cholecalciferol (VITAMIN D3) 25 MCG (1000 UNIT) tablet Take 1,000 Units by mouth daily.   clopidogrel (PLAVIX) 75 MG tablet Take 1 tablet by mouth daily. DO NOT START UNTIL SUNDAY, AUGUST 11   diltiazem (CARDIZEM CD) 240 MG 24 hr capsule Take 1 capsule (240 mg total) by mouth daily.   furosemide (LASIX) 40 MG tablet Take 1 tablet (40 mg total) by mouth every other day.   hydrALAZINE (APRESOLINE) 50 MG tablet Take 1 tablet (50 mg total) by mouth 3 (three) times daily.   ibuprofen (ADVIL) 200 MG tablet Take 400  mg by mouth daily as needed for mild pain or moderate pain.   metoprolol succinate (TOPROL-XL) 100 MG 24 hr tablet TAKE 1 TABLET BY MOUTH EVERY DAY   Multiple Vitamins-Minerals (MULTIVITAMIN WITH MINERALS) tablet Take 1 tablet by mouth daily.   NARCAN 4 MG/0.1ML LIQD nasal spray kit Take by nasal route every 3 minutes until patient awakes or EMS arrives.   omeprazole (PRILOSEC) 40 MG capsule Take 40 mg by mouth daily.   OVER THE COUNTER MEDICATION Apply 1 application  topically daily. CBD Oil   OXYCONTIN 30 MG 12 hr tablet Take 30 mg by mouth every 12 (twelve) hours.   rivaroxaban (XARELTO) 20 MG TABS tablet Take 20 mg by mouth at bedtime.   tiZANidine (ZANAFLEX) 4 MG tablet Take 4 mg by mouth in the morning and at bedtime.     Allergies:   Indocin [indomethacin] and Norco [hydrocodone-acetaminophen]   Social History   Socioeconomic History   Marital status: Married    Spouse name: Not on file   Number of children: Not on file   Years of education: Not on file   Highest education level: Not on file  Occupational History   Not on file  Tobacco Use   Smoking status: Former    Types: Cigars   Smokeless tobacco: Never   Tobacco comments:    quit smoking cigarettes 10-90yrs ago  Vaping Use   Vaping status: Never Used  Substance and Sexual  Activity   Alcohol use: Yes    Comment: couple of beers a week   Drug use: No   Sexual activity: Yes  Other Topics Concern   Not on file  Social History Narrative   Not on file   Social Determinants of Health   Financial Resource Strain: Not on file  Food Insecurity: Not on file  Transportation Needs: Not on file  Physical Activity: Not on file  Stress: Not on file  Social Connections: Not on file    Family History: The patient's family history includes Hypertension in an other family member.  ROS:   Please see the history of present illness.    All other systems reviewed and are negative.  EKGs/Labs/Other Studies Reviewed:    The following studies were reviewed today:  Cardiac Studies & Procedures       ECHOCARDIOGRAM  ECHOCARDIOGRAM COMPLETE 05/05/2022  Narrative ECHOCARDIOGRAM REPORT    Patient Name:   BRADD CUBERO Lahmann Date of Exam: 05/05/2022 Medical Rec #:  [id number]        Height:       70.0 in Accession #:    [id number]       Weight:       191.8 lb Date of Birth:  13-Jan-1951        BSA:          2.051 m Patient Age:    71 years         BP:           142/86 mmHg Patient Gender: M                HR:           103 bpm. Exam Location:  [address]  Procedure: 2D Echo, Cardiac Doppler and Color Doppler  Indications:    I48.91 Atrial fibrillation  History:        Patient has prior history of Echocardiogram examinations, most recent 07/25/2020. Dilated aortic root, Arrythmias:Atrial Fibrillation; Risk Factors:Hypertension.  Sonographer:    Chrissie Noa  [facility] RDCS Referring Phys: [id number] JESSE M CLEAVER  IMPRESSIONS   1. Left ventricular ejection fraction, by estimation, is 60 to 65%. The left ventricle has normal function. The left ventricle has no regional wall motion abnormalities. There is moderate left ventricular hypertrophy. Left ventricular diastolic parameters are indeterminate. 2. Right ventricular systolic function is normal. The right  ventricular size is normal. 3. Left atrial size was severely dilated. 4. The mitral valve is normal in structure. No evidence of mitral valve regurgitation. No evidence of mitral stenosis. 5. The aortic valve is tricuspid. There is mild calcification of the aortic valve. There is mild thickening of the aortic valve. Aortic valve regurgitation is not visualized. Aortic valve sclerosis is present, with no evidence of aortic valve stenosis. 6. Aortic dilatation noted. There is mild dilatation of the aortic root, measuring 42 mm. There is borderline dilatation of the ascending aorta, measuring 36 mm. 7. The inferior vena cava is normal in size with greater than 50% respiratory variability, suggesting right atrial pressure of 3 mmHg.  Comparison(s): SOV 40 mm, ascending aorta 39 mm.  FINDINGS Left Ventricle: Left ventricular ejection fraction, by estimation, is 60 to 65%. The left ventricle has normal function. The left ventricle has no regional wall motion abnormalities. The left ventricular internal cavity size was normal in size. There is moderate left ventricular hypertrophy. Left ventricular diastolic parameters are indeterminate.  Right Ventricle: The right ventricular size is normal. No increase in right ventricular wall thickness. Right ventricular systolic function is normal.  Left Atrium: Left atrial size was severely dilated.  Right Atrium: Right atrial size was normal in size.  Pericardium: There is no evidence of pericardial effusion.  Mitral Valve: The mitral valve is normal in structure. No evidence of mitral valve regurgitation. No evidence of mitral valve stenosis.  Tricuspid Valve: The tricuspid valve is normal in structure. Tricuspid valve regurgitation is not demonstrated. No evidence of tricuspid stenosis.  Aortic Valve: The aortic valve is tricuspid. There is mild calcification of the aortic valve. There is mild thickening of the aortic valve. Aortic valve regurgitation is  not visualized. Aortic valve sclerosis is present, with no evidence of aortic valve stenosis.  Pulmonic Valve: The pulmonic valve was normal in structure. Pulmonic valve regurgitation is trivial. No evidence of pulmonic stenosis.  Aorta: Aortic dilatation noted. There is mild dilatation of the aortic root, measuring 42 mm. There is borderline dilatation of the ascending aorta, measuring 36 mm.  Venous: The inferior vena cava is normal in size with greater than 50% respiratory variability, suggesting right atrial pressure of 3 mmHg.  IAS/Shunts: No atrial level shunt detected by color flow Doppler.   LEFT VENTRICLE PLAX 2D LVIDd:         5.10 cm   Diastology LVIDs:         3.90 cm   LV e' medial:    7.21 cm/s LV PW:         1.30 cm   LV E/e' medial:  13.2 LV IVS:        1.50 cm   LV e' lateral:   8.02 cm/s LVOT diam:     2.40 cm   LV E/e' lateral: 11.9 LV SV:         60 LV SV Index:   29 LVOT Area:     4.52 cm   IVC IVC diam: 1.20 cm  LEFT ATRIUM              Index  RIGHT ATRIUM           Index LA diam:        6.20 cm  3.02 cm/m   RA Pressure: 3.00 mmHg LA Vol (A2C):   98.3 ml  47.94 ml/m  RA Area:     20.00 cm LA Vol (A4C):   129.0 ml 62.91 ml/m  RA Volume:   52.50 ml  25.60 ml/m LA Biplane Vol: 117.0 ml 57.06 ml/m AORTIC VALVE LVOT Vmax:   77.76 cm/s LVOT Vmean:  49.300 cm/s LVOT VTI:    0.132 m  AORTA Ao Root diam: 4.20 cm Ao Asc diam:  3.60 cm  MITRAL VALVE               TRICUSPID VALVE MV Area (PHT): 4.54 cm    Estimated RAP:  3.00 mmHg MV Decel Time: 167 msec MV E velocity: 95.18 cm/s  SHUNTS Systemic VTI:  0.13 m Systemic Diam: 2.40 cm  Donato Schultz MD Electronically signed by Donato Schultz MD Signature Date/Time: 05/05/2022/1:17:40 PM    Final   TEE  ECHO TEE 02/03/2023  Narrative TRANSESOPHOGEAL ECHO REPORT    Patient Name:   Justin [name] Game Date of Exam: 02/03/2023 Medical Rec #:  [id number]        Height:       71.0 in Accession #:     [id number]       Weight:       190.0 lb Date of Birth:  [birth year]        BSA:          2.063 m Patient Age:    72 years         BP:           116/81 mmHg Patient Gender: M                HR:           78 bpm. Exam Location:  Inpatient  Procedure: Transesophageal Echo, Cardiac Doppler, Color Doppler and 3D Echo  Indications:     Watchman  History:         Patient has prior history of Echocardiogram examinations, most recent 09/03/2022. Arrythmias:Atrial Fibrillation.  Sonographer:     Darlys Gales Referring Phys:  [id number] Lanier Prude Diagnosing Phys: Charlton Haws MD  PROCEDURE: After discussion of the risks and benefits of a TEE, an informed consent was obtained from the patient. The transesophogeal probe was passed without difficulty through the esophogus of the patient. Sedation performed by different physician. The patient's vital signs; including heart rate, blood pressure, and oxygen saturation; remained stable throughout the procedure. The patient developed no complications during the procedure.  IMPRESSIONS   1. Well placed 24 mm Watchman FLX device Negative tugg test, no leak by color flow Average compression 22%. 2. Left ventricular ejection fraction, by estimation, is 60 to 65%. The left ventricle has normal function. The left ventricle has no regional wall motion abnormalities. 3. Right ventricular systolic function is normal. The right ventricular size is normal. 4. Left atrial size was moderately dilated. No left atrial/left atrial appendage thrombus was detected. 5. Right atrial size was moderately dilated. 6. No change in effusoin post procedure. 7. The mitral valve is normal in structure. Trivial mitral valve regurgitation. No evidence of mitral stenosis. 8. The aortic valve is tricuspid. Aortic valve regurgitation is not visualized. No aortic stenosis is present. 9. The inferior vena cava is normal in size with greater than 50% respiratory  variability,  suggesting right atrial pressure of 3 mmHg. 10. Left to right shunt only post trans septal puncture.  Conclusion(s)/Recommendation(s): Normal biventricular function without evidence of hemodynamically significant valvular heart disease.  FINDINGS Left Ventricle: Left ventricular ejection fraction, by estimation, is 60 to 65%. The left ventricle has normal function. The left ventricle has no regional wall motion abnormalities. The left ventricular internal cavity size was normal in size. There is no left ventricular hypertrophy.  Right Ventricle: The right ventricular size is normal. No increase in right ventricular wall thickness. Right ventricular systolic function is normal.  Left Atrium: Left atrial size was moderately dilated. No left atrial/left atrial appendage thrombus was detected.  Right Atrium: Right atrial size was moderately dilated.  Pericardium: No change in effusoin post procedure. Trivial pericardial effusion is present. The pericardial effusion is anterior to the right ventricle.  Mitral Valve: The mitral valve is normal in structure. Trivial mitral valve regurgitation. No evidence of mitral valve stenosis.  Tricuspid Valve: The tricuspid valve is normal in structure. Tricuspid valve regurgitation is trivial. No evidence of tricuspid stenosis.  Aortic Valve: The aortic valve is tricuspid. Aortic valve regurgitation is not visualized. No aortic stenosis is present.  Pulmonic Valve: The pulmonic valve was normal in structure. Pulmonic valve regurgitation is trivial. No evidence of pulmonic stenosis.  Aorta: The aortic root is normal in size and structure.  Venous: The inferior vena cava is normal in size with greater than 50% respiratory variability, suggesting right atrial pressure of 3 mmHg.  IAS/Shunts: Left to right shunt only post trans septal puncture.  Additional Comments: Well placed 24 mm Watchman FLX device Negative tugg test, no leak by color flow Average  compression 22%.  Charlton Haws MD Electronically signed by Charlton Haws MD Signature Date/Time: 02/03/2023/11:27:37 AM    Final            EKG:  EKG is not ordered today.    Recent Labs: 10/20/2022: ALT 26; TSH 2.660 01/13/2023: Hemoglobin 12.9; Platelets 210 02/03/2023: BUN 46; Creatinine, Ser 2.55; Potassium 3.6; Sodium 139   Recent Lipid Panel No results found for: "CHOL", "TRIG", "HDL", "CHOLHDL", "VLDL", "LDLCALC", "LDLDIRECT"  Physical Exam:    VS:  BP 118/70   Pulse 62   Ht 5\' 11"  (1.803 m)   Wt 198 lb 12.8 oz (90.2 kg)   SpO2 98%   BMI 27.73 kg/m     Wt Readings from Last 3 Encounters:  03/10/23 198 lb 12.8 oz (90.2 kg)  02/03/23 190 lb (86.2 kg)  01/13/23 195 lb (88.5 kg)    General: Well developed, well nourished, NAD Lungs:Clear to ausculation bilaterally. No wheezes, rales, or rhonchi. Breathing is unlabored. Cardiovascular: Irregularly irregular. No murmurs Extremities: R>L ankle edema.  Neuro: Alert and oriented. No focal deficits. No facial asymmetry. MAE spontaneously. Psych: Responds to questions appropriately with normal affect.    ASSESSMENT/PLAN:    Persistent atrial fibrillation: s/p LAAO with 24mm Watchman FLX Pro device on 02/03/23. He will continue Xarelto through 8/10 then stop and transition to Plavix 75mg  daily on 8/11 through 6 months. Additionally he will require dental SBE through 6 months as well. Amoxicillin RX'ed to preferred pharmacy. Given his poor renal function and low GFR, he was scheduled for post implant TEE 9/4 with Dr. Jacques Navy. Instruction letter reviewed with understanding. Obtain CBC, BMET.   Chronic diastolic CHF: Continue lasix 40mg  daily.    HTN: Stable today with no changes.    CKD stage IIIb: Undergoing post implant TEE  given poor renal function. BMET today.    Medication Adjustments/Labs and Tests Ordered: Current medicines are reviewed at length with the patient today.  Concerns regarding medicines are outlined  above.  Orders Placed This Encounter  Procedures   Basic metabolic panel   CBC   Meds ordered this encounter  Medications   clopidogrel (PLAVIX) 75 MG tablet    Sig: Take 1 tablet by mouth daily. DO NOT START UNTIL SUNDAY, AUGUST 11    Dispense:  90 tablet    Refill:  3   amoxicillin (AMOXIL) 500 MG tablet    Sig: Start Amoxicillin 500 mg, take 4 tablets by mouth 1 hour prior to dental procedures and cleanings.    Dispense:  12 tablet    Refill:  0    Patient Instructions  Medication Instructions:  Start Amoxicillin 500 mg, take 4 tablets by mouth 1 hour prior to dental procedures and cleanings. (Due to you having dental work scheduled today, take 4 tablets today at 1:30 PM)  2. Stop Xarelto on Saturday, August 10   3. Start Clopidogrel (Plavix) 75 mg daily on Sunday, August 11   *If you need a refill on your cardiac medications before your next appointment, please call your pharmacy*   Lab Work: Your physician recommends that you return for lab work on 03/29/23 for labs  Bmp, Cbc   If you have labs (blood work) drawn today and your tests are completely normal, you will receive your results only by: MyChart Message (if you have MyChart) OR A paper copy in the mail If you have any lab test that is abnormal or we need to change your treatment, we will call you to review the results.   Testing/Procedures: TEE as scheduled    Follow-Up: Follow up as scheduled  Other Instructions     Signed, Georgie Chard, NP  03/11/2023 9:00 AM    [facility]

## 2023-03-10 ENCOUNTER — Ambulatory Visit: Payer: Medicare Other | Attending: Cardiology | Admitting: Cardiology

## 2023-03-10 VITALS — BP 118/70 | HR 62 | Ht 71.0 in | Wt 198.8 lb

## 2023-03-10 DIAGNOSIS — I480 Paroxysmal atrial fibrillation: Secondary | ICD-10-CM

## 2023-03-10 DIAGNOSIS — I4819 Other persistent atrial fibrillation: Secondary | ICD-10-CM

## 2023-03-10 DIAGNOSIS — N1832 Chronic kidney disease, stage 3b: Secondary | ICD-10-CM

## 2023-03-10 DIAGNOSIS — I10 Essential (primary) hypertension: Secondary | ICD-10-CM | POA: Diagnosis not present

## 2023-03-10 DIAGNOSIS — I5032 Chronic diastolic (congestive) heart failure: Secondary | ICD-10-CM

## 2023-03-10 DIAGNOSIS — Z95818 Presence of other cardiac implants and grafts: Secondary | ICD-10-CM

## 2023-03-10 MED ORDER — CLOPIDOGREL BISULFATE 75 MG PO TABS: ORAL_TABLET | ORAL | 3 refills | Status: DC

## 2023-03-10 MED ORDER — AMOXICILLIN 500 MG PO TABS: ORAL_TABLET | ORAL | 0 refills | Status: DC

## 2023-03-10 NOTE — Patient Instructions (Addendum)
Medication Instructions:  Start Amoxicillin 500 mg, take 4 tablets by mouth 1 hour prior to dental procedures and cleanings. (Due to you having dental work scheduled today, take 4 tablets today at 1:30 PM)  2. Stop Xarelto on Saturday, August 10   3. Start Clopidogrel (Plavix) 75 mg daily on Sunday, August 11   *If you need a refill on your cardiac medications before your next appointment, please call your pharmacy*   Lab Work: Your physician recommends that you return for lab work on 03/29/23 for labs  Bmp, Cbc   If you have labs (blood work) drawn today and your tests are completely normal, you will receive your results only by: MyChart Message (if you have MyChart) OR A paper copy in the mail If you have any lab test that is abnormal or we need to change your treatment, we will call you to review the results.   Testing/Procedures: TEE as scheduled    Follow-Up: Follow up as scheduled  Other Instructions

## 2023-03-29 ENCOUNTER — Ambulatory Visit: Payer: Medicare Other | Attending: Internal Medicine

## 2023-03-29 DIAGNOSIS — I480 Paroxysmal atrial fibrillation: Secondary | ICD-10-CM | POA: Diagnosis not present

## 2023-03-30 LAB — BASIC METABOLIC PANEL
BUN/Creatinine Ratio: 11 (ref 10–24)
BUN: 25 mg/dL (ref 8–27)
CO2: 21 mmol/L (ref 20–29)
Calcium: 9 mg/dL (ref 8.6–10.2)
Chloride: 104 mmol/L (ref 96–106)
Creatinine, Ser: 2.26 mg/dL — ABNORMAL HIGH (ref 0.76–1.27)
Glucose: 142 mg/dL — ABNORMAL HIGH (ref 70–99)
Potassium: 4.2 mmol/L (ref 3.5–5.2)
Sodium: 140 mmol/L (ref 134–144)
eGFR: 30 mL/min/{1.73_m2} — ABNORMAL LOW (ref >59)

## 2023-03-30 LAB — CBC
Hematocrit: 36.9 % — ABNORMAL LOW (ref 37.5–51.0)
Hemoglobin: 12.1 g/dL — ABNORMAL LOW (ref 13.0–17.7)
MCH: 32.5 pg (ref 26.6–33.0)
MCHC: 32.8 g/dL (ref 31.5–35.7)
MCV: 99 fL — ABNORMAL HIGH (ref 79–97)
Platelets: 190 10*3/uL (ref 150–450)
RBC: 3.72 x10E6/uL — ABNORMAL LOW (ref 4.14–5.80)
RDW: 11.7 % (ref 11.6–15.4)
WBC: 3.8 10*3/uL (ref 3.4–10.8)

## 2023-04-13 NOTE — Progress Notes (Signed)
Unable to reach patient about procedure, but was able to leave a detailed message. Stated that the patient needed to arrive at the hospital at 0730 , remain NPO after 0000, needs to have a ride home and a responsible adult to stay with them for 24 hours after the procedure. Instructed the patient to call back if they had any questions.

## 2023-04-14 ENCOUNTER — Ambulatory Visit (HOSPITAL_BASED_OUTPATIENT_CLINIC_OR_DEPARTMENT_OTHER): Payer: Medicare Other | Admitting: Certified Registered"

## 2023-04-14 ENCOUNTER — Encounter (HOSPITAL_COMMUNITY)
Admission: RE | Disposition: A | Discharge status: Home / Self Care | Payer: Self-pay | Source: Home / Self Care | Attending: Internal Medicine

## 2023-04-14 ENCOUNTER — Ambulatory Visit (HOSPITAL_COMMUNITY)
Admission: RE | Admit: 2023-04-14 | Discharge: 2023-04-14 | Disposition: A | Discharge status: Home / Self Care | Payer: Medicare Other | Attending: Internal Medicine | Admitting: Internal Medicine

## 2023-04-14 ENCOUNTER — Encounter (HOSPITAL_COMMUNITY): Payer: Self-pay | Admitting: Internal Medicine

## 2023-04-14 ENCOUNTER — Ambulatory Visit (HOSPITAL_BASED_OUTPATIENT_CLINIC_OR_DEPARTMENT_OTHER)
Admission: RE | Admit: 2023-04-14 | Discharge: 2023-04-14 | Disposition: A | Discharge status: Home / Self Care | Payer: Medicare Other | Source: Ambulatory Visit | Attending: Internal Medicine | Admitting: Internal Medicine

## 2023-04-14 ENCOUNTER — Other Ambulatory Visit: Payer: Self-pay

## 2023-04-14 ENCOUNTER — Ambulatory Visit (HOSPITAL_COMMUNITY): Payer: Medicare Other | Admitting: Certified Registered"

## 2023-04-14 DIAGNOSIS — Z95818 Presence of other cardiac implants and grafts: Secondary | ICD-10-CM | POA: Insufficient documentation

## 2023-04-14 DIAGNOSIS — Z7901 Long term (current) use of anticoagulants: Secondary | ICD-10-CM | POA: Diagnosis not present

## 2023-04-14 DIAGNOSIS — I5032 Chronic diastolic (congestive) heart failure: Secondary | ICD-10-CM | POA: Diagnosis not present

## 2023-04-14 DIAGNOSIS — I130 Hypertensive heart and chronic kidney disease with heart failure and stage 1 through stage 4 chronic kidney disease, or unspecified chronic kidney disease: Secondary | ICD-10-CM | POA: Insufficient documentation

## 2023-04-14 DIAGNOSIS — I480 Paroxysmal atrial fibrillation: Secondary | ICD-10-CM

## 2023-04-14 DIAGNOSIS — Z87891 Personal history of nicotine dependence: Secondary | ICD-10-CM

## 2023-04-14 DIAGNOSIS — I4819 Other persistent atrial fibrillation: Secondary | ICD-10-CM | POA: Diagnosis not present

## 2023-04-14 DIAGNOSIS — I129 Hypertensive chronic kidney disease with stage 1 through stage 4 chronic kidney disease, or unspecified chronic kidney disease: Secondary | ICD-10-CM | POA: Diagnosis not present

## 2023-04-14 DIAGNOSIS — M109 Gout, unspecified: Secondary | ICD-10-CM | POA: Insufficient documentation

## 2023-04-14 DIAGNOSIS — I700 Atherosclerosis of aorta: Secondary | ICD-10-CM | POA: Diagnosis not present

## 2023-04-14 DIAGNOSIS — I10 Essential (primary) hypertension: Secondary | ICD-10-CM | POA: Diagnosis not present

## 2023-04-14 DIAGNOSIS — I4891 Unspecified atrial fibrillation: Secondary | ICD-10-CM | POA: Diagnosis not present

## 2023-04-14 DIAGNOSIS — Z79899 Other long term (current) drug therapy: Secondary | ICD-10-CM | POA: Diagnosis not present

## 2023-04-14 DIAGNOSIS — N1832 Chronic kidney disease, stage 3b: Secondary | ICD-10-CM | POA: Diagnosis not present

## 2023-04-14 DIAGNOSIS — N189 Chronic kidney disease, unspecified: Secondary | ICD-10-CM

## 2023-04-14 HISTORY — PX: TEE WITHOUT CARDIOVERSION: SHX5443

## 2023-04-14 LAB — ECHO TEE

## 2023-04-14 SURGERY — ECHOCARDIOGRAM, TRANSESOPHAGEAL
Anesthesia: Monitor Anesthesia Care

## 2023-04-14 MED ORDER — PROPOFOL 500 MG/50ML IV EMUL
INTRAVENOUS | Status: DC | PRN
  Administered 2023-04-14: 100 ug/kg/min via INTRAVENOUS

## 2023-04-14 MED ORDER — BUTAMBEN-TETRACAINE-BENZOCAINE 2-2-14 % EX AERO
INHALATION_SPRAY | CUTANEOUS | Status: AC
  Filled 2023-04-14: qty 20

## 2023-04-14 MED ORDER — PROPOFOL 10 MG/ML IV BOLUS
INTRAVENOUS | Status: DC | PRN
  Administered 2023-04-14: 80 mg via INTRAVENOUS
  Administered 2023-04-14: 20 mg via INTRAVENOUS

## 2023-04-14 MED ORDER — SODIUM CHLORIDE 0.9 % IV SOLN: INTRAVENOUS | Status: DC

## 2023-04-14 MED ORDER — PHENYLEPHRINE 80 MCG/ML (10ML) SYRINGE FOR IV PUSH (FOR BLOOD PRESSURE SUPPORT)
PREFILLED_SYRINGE | INTRAVENOUS | Status: DC | PRN
  Administered 2023-04-14: 80 ug via INTRAVENOUS

## 2023-04-14 MED ORDER — LIDOCAINE 2% (20 MG/ML) 5 ML SYRINGE
INTRAMUSCULAR | Status: DC | PRN
  Administered 2023-04-14: 100 mg via INTRAVENOUS

## 2023-04-14 NOTE — H&P (Signed)
[facility]   MULTIDISCIPLINARY [facility]  Structural Heart Office Note:  .    Date:  04/14/2023  ID:  Justin [name], DOB May 26, 1951, MRN [id number] PCP: Charlane Ferretti, DO  [facility] Providers Cardiologist:  Chrystie Nose, MD Electrophysiologist:  Lanier Prude, MD {  History of Present Illness: .    Justin [name] is a 72 y.o. male with a hx of spondylitis, gout, HTN, RBBB, CKD stage IIIb, chronic diastolic CHF, persistent afib (failed DCCV in 12/2021) with excessive bruising who is now s/p LAAO closure with Watchman 02/03/23 and is being seen today for 1 month follow up.    Justin [name] was referred to Dr. Rennis Golden in 12/2021 for evaluation of new onset atrial fibrillation. He reported fatigue and some shortness of breath. His metoprolol was increased and he was given samples of Xarelto given issues with cost. He also noted issues with excessive bruising. Echo 05/05/22 showed EF 65-70%, severe LAE, mild thickening of AoV and mild aortic dilation.    He underwent DCCV 12/24/2021. He received 3 shocks at 200 J. He converted to 4-5 beats of NSR before returning to atrial fibrillation.    He was seen by Dr. Lalla Brothers for consideration of LAAO. Cardiac CT 06/22/22 showed severe biatrial enlargement with a chicken wing appendage average diameter 19.8 mm depth 22 mm suitable for a 24 mm Watchman FLX device as well as an elevated coronary calcium score. He was scheduled for LAAO on 09/03/22 but this was ultimately cancelled due to uncontrolled hypertension and medications were adjusted. He then developed afib with RVR and acute CHF and was eventually stabilized with adjustments in diuretics.    He is now s/p LAAO with 24mm Watchman FLX Pro device on 02/03/23. He was restarted on Xarelto to complete 45 days (8/10). He will then stop and start Plavix 75mg  (8/11) for 6 months. Given his poor renal function and low GFR, he was scheduled for post implant TEE 9/4 with Dr. Jacques Navy.     Today he is here alone and reports that he has been doing very well since discharge. BP today looks great and he only has trace right ankle edema on exam. He denies chest pain, SOB, palpitations, orthopnea, PND, dizziness, or syncope. Denies bleeding in stool or urine.   Physical Exam:    VS:  BP 132/88   Pulse 60   Temp (!) 97.4 F (36.3 C) (Temporal)   Resp 17   Ht 5\' 11"  (1.803 m)   Wt 86.2 kg   SpO2 100%   BMI 26.50 kg/m    Wt Readings from Last 3 Encounters:  04/14/23 86.2 kg  03/10/23 90.2 kg  02/03/23 86.2 kg    General: Well developed, well nourished, NAD Lungs:Clear to ausculation bilaterally. No wheezes, rales, or rhonchi. Breathing is unlabored. Cardiovascular: Irregularly irregular. No murmurs Extremities: R>L ankle edema.  Neuro: Alert and oriented. No focal deficits. No facial asymmetry. MAE spontaneously. Psych: Responds to questions appropriately with normal affect.     ASSESSMENT/PLAN:     Persistent atrial fibrillation: s/p LAAO with 24mm Watchman FLX Pro device on 02/03/23. He will continue Xarelto through 8/10 then stop and transition to Plavix 75mg  daily on 8/11 through 6 months. Additionally he will require dental SBE through 6 months as well. Amoxicillin RX'ed to preferred pharmacy. Given his poor renal function and low GFR, he was scheduled for post implant TEE 9/4 with Dr. Jacques Navy. Instruction letter reviewed with understanding. Obtain CBC,  BMET.    Chronic diastolic CHF: Continue lasix 40mg  daily.    HTN: Stable today with no changes.    CKD stage IIIb: Undergoing post implant TEE given poor renal function. BMET today.    Signed, Georgie Chard, NP

## 2023-04-14 NOTE — Transfer of Care (Signed)
Immediate Anesthesia Transfer of Care Note  Patient: Justin [name]  Procedure(s) Performed: TRANSESOPHAGEAL ECHOCARDIOGRAM  Patient Location: PACU and Cath Lab  Anesthesia Type:MAC  Level of Consciousness: awake, alert , and oriented  Airway & Oxygen Therapy: Patient Spontanous Breathing  Post-op Assessment: Report given to RN and Post -op Vital signs reviewed and stable  Post vital signs: Reviewed and stable  Last Vitals:  Vitals Value Taken Time  BP    Temp    Pulse    Resp    SpO2      Last Pain:  Vitals:   04/14/23 0757  TempSrc:   PainSc: 3          Complications: No notable events documented.

## 2023-04-14 NOTE — Progress Notes (Signed)
  Echocardiogram Echocardiogram Transesophageal has been performed.  Milda Smart 04/14/2023, 9:02 AM

## 2023-04-14 NOTE — Anesthesia Preprocedure Evaluation (Signed)
Anesthesia Evaluation  Patient identified by MRN, date of birth, ID band Patient awake    Reviewed: Allergy & Precautions, NPO status , Patient's Chart, lab work & pertinent test results, reviewed documented beta blocker date and time   History of Anesthesia Complications Negative for: history of anesthetic complications  Airway Mallampati: III  TM Distance: >3 FB Neck ROM: Full    Dental  (+) Dental Advisory Given, Teeth Intact   Pulmonary shortness of breath, asthma , former smoker   breath sounds clear to auscultation       Cardiovascular hypertension, Pt. on medications and Pt. on home beta blockers + dysrhythmias Atrial Fibrillation  Rhythm:Irregular   1. Well placed 24 mm Watchman FLX device Negative tugg test, no leak by  color flow Average compression 22%.   2. Left ventricular ejection fraction, by estimation, is 60 to 65%. The  left ventricle has normal function. The left ventricle has no regional  wall motion abnormalities.   3. Right ventricular systolic function is normal. The right ventricular  size is normal.   4. Left atrial size was moderately dilated. No left atrial/left atrial  appendage thrombus was detected.   5. Right atrial size was moderately dilated.   6. No change in effusoin post procedure.   7. The mitral valve is normal in structure. Trivial mitral valve  regurgitation. No evidence of mitral stenosis.   8. The aortic valve is tricuspid. Aortic valve regurgitation is not  visualized. No aortic stenosis is present.   9. The inferior vena cava is normal in size with greater than 50%  respiratory variability, suggesting right atrial pressure of 3 mmHg.  10. Left to right shunt only post trans septal puncture.      Neuro/Psych negative neurological ROS  negative psych ROS   GI/Hepatic ,,,(+) Hepatitis -, C  Endo/Other  negative endocrine ROS    Renal/GU CRFRenal diseaseLab Results       Component                Value               Date                      NA                       140                 03/29/2023                K                        4.2                 03/29/2023                CO2                      21                  03/29/2023                GLUCOSE                  142 (H)             03/29/2023  BUN                      25                  03/29/2023                CREATININE               2.26 (H)            03/29/2023                CALCIUM                  9.0                 03/29/2023                EGFR                     30 (L)              03/29/2023                GFRNONAA                 26 (L)              02/03/2023                Musculoskeletal  (+) Arthritis ,    Abdominal   Peds  Hematology  (+) Blood dyscrasia Lab Results      Component                Value               Date                      WBC                      3.8                 03/29/2023                HGB                      12.1 (L)            03/29/2023                HCT                      36.9 (L)            03/29/2023                MCV                      99 (H)              03/29/2023                PLT                      190                 03/29/2023            plavix   Anesthesia Other Findings Full beard  Reproductive/Obstetrics  Anesthesia Physical Anesthesia Plan  ASA: 3  Anesthesia Plan: MAC   Post-op Pain Management: Minimal or no pain anticipated   Induction: Intravenous  PONV Risk Score and Plan: 1 and Propofol infusion and Treatment may vary due to age or medical condition  Airway Management Planned: Nasal Cannula, Simple Face Mask and Natural Airway  Additional Equipment: None  Intra-op Plan:   Post-operative Plan:   Informed Consent: I have reviewed the patients History and Physical, chart, labs and discussed the procedure including the risks,  benefits and alternatives for the proposed anesthesia with the patient or authorized representative who has indicated his/her understanding and acceptance.     Dental advisory given  Plan Discussed with: CRNA  Anesthesia Plan Comments:         Anesthesia Quick Evaluation

## 2023-04-14 NOTE — CV Procedure (Signed)
INDICATIONS: Post watchman evaluation  PROCEDURE:   Informed consent was obtained prior to the procedure. The risks, benefits and alternatives for the procedure were discussed and the patient comprehended these risks.  Risks include, but are not limited to, cough, sore throat, vomiting, nausea, somnolence, esophageal and stomach trauma or perforation, bleeding, low blood pressure, aspiration, pneumonia, infection, trauma to the teeth and death.    After a procedural time-out, the oropharynx was anesthetized with 20% benzocaine spray.   During this procedure the patient was administered propofol per anesthesia.  The patient's heart rate, blood pressure, and oxygen saturation were monitored continuously during the procedure. The period of conscious sedation was 10 minutes, of which I was present face-to-face 100% of this time.  The transesophageal probe was inserted in the esophagus and stomach without difficulty and multiple views were obtained.  The patient was kept under observation until the patient left the procedure room.  The patient left the procedure room in stable condition.   Agitated microbubble saline contrast was not administered.  COMPLICATIONS:    There were no immediate complications.  FINDINGS:   FORMAL ECHOCARDIOGRAM REPORT PENDING Normal appearance of 24 mm Watchman FLX. No peridevice leak.   Time Spent Directly with the Patient:  30 minutes   Parke Poisson 04/14/2023, 8:54 AM

## 2023-04-15 ENCOUNTER — Telehealth: Payer: Self-pay

## 2023-04-15 ENCOUNTER — Other Ambulatory Visit: Payer: Self-pay

## 2023-04-15 DIAGNOSIS — I10 Essential (primary) hypertension: Secondary | ICD-10-CM | POA: Diagnosis not present

## 2023-04-15 DIAGNOSIS — I700 Atherosclerosis of aorta: Secondary | ICD-10-CM | POA: Diagnosis not present

## 2023-04-15 DIAGNOSIS — I4891 Unspecified atrial fibrillation: Secondary | ICD-10-CM

## 2023-04-15 MED ORDER — METOPROLOL SUCCINATE ER 100 MG PO TB24
100.0000 mg | ORAL_TABLET | Freq: Every day | ORAL | 3 refills | Status: DC
Start: 2023-04-15 — End: 2024-06-20

## 2023-04-15 NOTE — Telephone Encounter (Signed)
Per Dr. Lupe Carney TEE report, informed the patient that his 24 mm Watchman FLX device is well seated in left atrial  appendage. No peridevice leak. No adherent thrombus.  Results routed to Dr. Lalla Brothers and will contact the patient if he needs to be updated.

## 2023-04-15 NOTE — Anesthesia Postprocedure Evaluation (Signed)
Anesthesia Post Note  Patient: Justin [name]  Procedure(s) Performed: TRANSESOPHAGEAL ECHOCARDIOGRAM     Patient location during evaluation: Cath Lab Anesthesia Type: MAC Level of consciousness: awake and alert Pain management: pain level controlled Vital Signs Assessment: post-procedure vital signs reviewed and stable Respiratory status: spontaneous breathing, nonlabored ventilation and respiratory function stable Cardiovascular status: stable and blood pressure returned to baseline Postop Assessment: no apparent nausea or vomiting Anesthetic complications: no   No notable events documented.  Last Vitals:  Vitals:   04/14/23 0915 04/14/23 0920  BP: 118/62 106/64  Pulse: (!) 55 62  Resp: 15 14  Temp: 36.7 C   SpO2: 96% 94%    Last Pain:  Vitals:   04/14/23 0920  TempSrc:   PainSc: 0-No pain                 Pascale Maves

## 2023-04-27 DIAGNOSIS — Z5181 Encounter for therapeutic drug level monitoring: Secondary | ICD-10-CM | POA: Diagnosis not present

## 2023-04-27 DIAGNOSIS — G894 Chronic pain syndrome: Secondary | ICD-10-CM | POA: Diagnosis not present

## 2023-04-27 DIAGNOSIS — Z79891 Long term (current) use of opiate analgesic: Secondary | ICD-10-CM | POA: Diagnosis not present

## 2023-05-06 ENCOUNTER — Ambulatory Visit (HOSPITAL_COMMUNITY): Payer: Medicare Other | Attending: General Practice

## 2023-05-06 DIAGNOSIS — I77810 Thoracic aortic ectasia: Secondary | ICD-10-CM | POA: Diagnosis not present

## 2023-05-06 DIAGNOSIS — I4891 Unspecified atrial fibrillation: Secondary | ICD-10-CM | POA: Insufficient documentation

## 2023-05-06 LAB — ECHOCARDIOGRAM COMPLETE: S' Lateral: 3.4 cm

## 2023-05-28 ENCOUNTER — Other Ambulatory Visit: Payer: Self-pay

## 2023-05-28 ENCOUNTER — Telehealth: Payer: Self-pay

## 2023-05-28 MED ORDER — CLOPIDOGREL BISULFATE 75 MG PO TABS: ORAL_TABLET | ORAL | 3 refills | Status: DC

## 2023-05-28 NOTE — Telephone Encounter (Signed)
The patient contacted the office to obtain a new refill for clopidogrel.  He states that the pharmacy is having difficulty filling this medication.  When I entered the refill this prompted a warning that the pt is taking omeprazole.  The pt states that he may take this medication once a week but he does not take it on a regular basis.  I advised him of the interaction between clopidogrel and omeprazole and the need to change from omeprazole to pantoprazole. The pt does not want to change this medication at this time.  I advised him to contact the office if he had to start taking omeprazole on a regular basis because we would need to change his medication due to interaction.  Pt agreed with plan.

## 2023-07-27 NOTE — Progress Notes (Unsigned)
[facility]                                     Cardiology Office Note:    Date:  07/28/2023   ID:  Justin [name], DOB Apr 27, 1951, MRN [id number]  PCP:  Charlane Ferretti, DO  [facility] Cardiologist:  Chrystie Nose, MD  [facility] Electrophysiologist:  Lanier Prude, MD   Referring MD: Charlane Ferretti, DO   Chief Complaint  Patient presents with   Atrial fibrillation with RVR   PAF (paroxysmal atrial fibrillation) <9>   Follow-up    6 month   6 month s/p LAAO    History of Present Illness:    Justin [name] is a 72 y.o. male with a hx of spondylitis, gout, HTN, RBBB, CKD stage IIIb, chronic diastolic CHF, persistent afib (failed DCCV in 12/2021) with excessive bruising who is now s/p LAAO closure with Watchman 02/03/23 and is being seen today for 6 month follow up.    Justin [name] was referred to Dr .Rennis Golden in 12/2021 for evaluation of new onset atrial fibrillation. He reported fatigue and some shortness of breath. His metoprolol was increased and he was given samples of Xarelto given issues with cost. He also noted issues with excessive bruising. Echo 05/05/22 showed EF 65-70%, severe LAE, mild thickening of AoV and mild aortic dilation. He underwent DCCV 12/24/2021 to brief NSR before returning to atrial fibrillation.   Eventually he was referred to Dr. Lalla Brothers for consideration of LAAO. Cardiac CT 06/22/22 showed anatomy suitable to proceed and is now s/p LAAO with 24mm Watchman FLX Pro device on 02/03/23. He was restarted on Xarelto and eventually transitioned to Plavix to complete 6 months post implant therapy. Given poor renal function TEE was performed that showed no leak or thrombus.   Today he is here alone and reports he has been well since last seen. He is now 6 months out from implant and may stop Plavix and dental SBE. He denies chest pain, SOB, palpitations, LE edema, orthopnea, PND, dizziness, or syncope. Denies bleeding in stool or urine.    Past  Medical History:  Diagnosis Date   Angioedema 03/28/2012   "tongue and throat"- was never known why"was taking humira at the time.   Arthritis    "hands""back"   Asthma    treated for over 21yrs ago   Atrial fibrillation (HCC)    Chronic back pain    scoliosis/spondylosis   Complication of anesthesia    may try fight when waking up   History of bronchitis 10+yrs ago   History of colon polyps    History of gout    last time many yrs ago   History of kidney stones    Joint pain    Joint swelling    Lyme disease    just completed treatment with Doxycycline    Pneumonia 08/10/2013   Presence of Watchman left atrial appendage closure device 02/03/2023   24mm Watchman FLX Pro placed by Dr. Lalla Brothers   Spondylitis, ankylosing (HCC)    Weakness    and numbness in right leg    Past Surgical History:  Procedure Laterality Date   APPENDECTOMY     BILIARY DILATION  07/25/2020   Procedure: BILIARY DILATION;  Surgeon: Vida Rigger, MD;  Location: [facility] END[facility];  Service: Endoscopy;;   CARDIOVERSION N/A 12/24/2021   Procedure: CARDIOVERSION;  Surgeon: O'Neal,  Ronnald Ramp, MD;  Location: [facility] ENDOSCOPY;  Service: Cardiovascular;  Laterality: N/A;   CARDIOVERSION N/A 10/23/2022   Procedure: CARDIOVERSION;  Surgeon: Lewayne Bunting, MD;  Location: [facility] ENDOSCOPY;  Service: Cardiovascular;  Laterality: N/A;   CHOLECYSTECTOMY     COLONOSCOPY     ERCP N/A 07/25/2020   Procedure: ENDOSCOPIC RETROGRADE CHOLANGIOPANCREATOGRAPHY (ERCP);  Surgeon: Vida Rigger, MD;  Location: [facility];  Service: Endoscopy;  Laterality: N/A;   ESOPHAGOGASTRODUODENOSCOPY     knuckle surgery     right middle finger a couple of times   LEFT ATRIAL APPENDAGE OCCLUSION N/A 02/03/2023   Procedure: LEFT ATRIAL APPENDAGE OCCLUSION;  Surgeon: Lanier Prude, MD;  Location: [facility] INVASIVE CV LAB;  Service: Cardiovascular;  Laterality: N/A;   LUMBAR LAMINECTOMY/DECOMPRESSION MICRODISCECTOMY Right 01/11/2013   Procedure: CENTRAL  DECOMPRESSION LUMBAR LAMINECTOMYL4-L5 RIGHT, MICRODISCECTOMY L4-L5 RIGHT     (1 LEVEL);  Surgeon: Jacki Cones, MD;  Location: [facility] ORS;  Service: Orthopedics;  Laterality: Right;   REMOVAL OF STONES  07/25/2020   Procedure: REMOVAL OF STONES;  Surgeon: Vida Rigger, MD;  Location: [facility] ENDOSCOPY;  Service: Endoscopy;;   SEPTOPLASTY     SHOULDER OPEN ROTATOR CUFF REPAIR Left    x 2;impingement syndrome and torn rotator cuff   SPHINCTEROTOMY  07/25/2020   Procedure: SPHINCTEROTOMY;  Surgeon: Vida Rigger, MD;  Location: [facility];  Service: Endoscopy;;   SPINAL CORD STIMULATOR INSERTION N/A 01/04/2014   Procedure: LUMBAR SPINAL CORD STIMULATOR INSERTION;  Surgeon: Venita Lick, MD;  Location: [facility] OR;  Service: Orthopedics;  Laterality: N/A;   TEE WITHOUT CARDIOVERSION N/A 09/03/2022   Procedure: TRANSESOPHAGEAL ECHOCARDIOGRAM (TEE);  Surgeon: Lanier Prude, MD;  Location: [facility] INVASIVE CV LAB;  Service: Cardiovascular;  Laterality: N/A;   TEE WITHOUT CARDIOVERSION N/A 02/03/2023   Procedure: TRANSESOPHAGEAL ECHOCARDIOGRAM;  Surgeon: Lanier Prude, MD;  Location: [facility] INVASIVE CV LAB;  Service: Cardiovascular;  Laterality: N/A;   TEE WITHOUT CARDIOVERSION N/A 04/14/2023   Procedure: TRANSESOPHAGEAL ECHOCARDIOGRAM;  Surgeon: Parke Poisson, MD;  Location: [facility] INVASIVE CV LAB;  Service: Cardiovascular;  Laterality: N/A;   TONSILLECTOMY      Current Medications: Current Meds  Medication Sig   albuterol (VENTOLIN HFA) 108 (90 Base) MCG/ACT inhaler Inhale 2 puffs into the lungs every 4 (four) hours as needed for wheezing or shortness of breath.   amiodarone (PACERONE) 200 MG tablet Take 1 tablet (200 mg total) by mouth daily.   Ascorbic Acid (VITAMIN C PO) Take 1,000 mg by mouth daily.   cholecalciferol (VITAMIN D3) 25 MCG (1000 UNIT) tablet Take 1,000 Units by mouth daily.   diltiazem (CARDIZEM CD) 240 MG 24 hr capsule Take 1 capsule (240 mg total) by mouth daily.   hydrALAZINE (APRESOLINE)  50 MG tablet Take 1 tablet (50 mg total) by mouth 3 (three) times daily.   metoprolol succinate (TOPROL-XL) 100 MG 24 hr tablet Take 1 tablet (100 mg total) by mouth daily.   Multiple Vitamins-Minerals (MULTIVITAMIN WITH MINERALS) tablet Take 1 tablet by mouth daily.   NARCAN 4 MG/0.1ML LIQD nasal spray kit Take by nasal route every 3 minutes until patient awakes or EMS arrives.   omeprazole (PRILOSEC) 40 MG capsule Take 40 mg by mouth daily.   OVER THE COUNTER MEDICATION Apply 1 application  topically daily. CBD Oil   OXYCONTIN 30 MG 12 hr tablet Take 30 mg by mouth every 12 (twelve) hours.   tiZANidine (ZANAFLEX) 4 MG tablet Take 4 mg by mouth in the  morning and at bedtime.   [DISCONTINUED] amoxicillin (AMOXIL) 500 MG tablet Start Amoxicillin 500 mg, take 4 tablets by mouth 1 hour prior to dental procedures and cleanings.   [DISCONTINUED] clopidogrel (PLAVIX) 75 MG tablet Take 1 tablet by mouth daily.     Allergies:   Indocin [indomethacin] and Norco [hydrocodone-acetaminophen]   Social History   Socioeconomic History   Marital status: Married    Spouse name: Not on file   Number of children: 0   Years of education: Not on file   Highest education level: Not on file  Occupational History   Not on file  Tobacco Use   Smoking status: Former    Types: Cigars    Quit date: 2018    Years since quitting: 6.9   Smokeless tobacco: Never   Tobacco comments:    quit smoking cigarettes 10-43yrs ago  Vaping Use   Vaping status: Never Used  Substance and Sexual Activity   Alcohol use: Yes    Comment: couple of beers a week   Drug use: No   Sexual activity: Yes  Other Topics Concern   Not on file  Social History Narrative   Not on file   Social Drivers of Health   Financial Resource Strain: Not on file  Food Insecurity: Not on file  Transportation Needs: Not on file  Physical Activity: Not on file  Stress: Not on file  Social Connections: Not on file     Family History: The  patient's family history includes Hypertension in an other family member.  ROS:   Please see the history of present illness.    All other systems reviewed and are negative.  EKGs/Labs/Other Studies Reviewed:    The following studies were reviewed today:   Cardiac Studies & Procedures      ECHOCARDIOGRAM  ECHOCARDIOGRAM COMPLETE 05/06/2023  Narrative ECHOCARDIOGRAM REPORT    Patient Name:   SIYON BAGGOTT Streetman Date of Exam: 05/06/2023 Medical Rec #:  [id number]        Height:       71.0 in Accession #:    [id number]       Weight:       190.0 lb Date of Birth:  1951-02-11        BSA:          2.063 m Patient Age:    72 years         BP:           118/70 mmHg Patient Gender: M                HR:           76 bpm. Exam Location:  [address]  Procedure: 2D Echo, Cardiac Doppler and Color Doppler  Indications:    I77.810 Aortic root dilation  History:        Patient has prior history of Echocardiogram examinations, most recent 04/14/2023. Arrythmias:Atrial Fibrillation and RBBB; Risk Factors:Hypertension. Elevated coronary artery calcium score. Chronic pain. Presence of Watchman left atrial appendage closure device.  Sonographer:    Cathie Beams RCS Referring Phys: [id number] JESSE M CLEAVER  IMPRESSIONS   1. Left ventricular ejection fraction, by estimation, is 60 to 65%. The left ventricle has normal function. The left ventricle has no regional wall motion abnormalities. Left ventricular diastolic parameters are indeterminate. 2. Right ventricular systolic function is normal. The right ventricular size is normal. 3. Left atrial size was moderately dilated. 4. The mitral valve is normal in structure.  No evidence of mitral valve regurgitation. No evidence of mitral stenosis. 5. The aortic valve is tricuspid. There is mild calcification of the aortic valve. Aortic valve regurgitation is not visualized. Aortic valve sclerosis is present, with no evidence of aortic valve  stenosis. 6. There is borderline dilatation of the ascending aorta, measuring 39 mm. 7. The inferior vena cava is normal in size with greater than 50% respiratory variability, suggesting right atrial pressure of 3 mmHg.  Comparison(s): Prior images reviewed side by side.  FINDINGS Left Ventricle: Left ventricular ejection fraction, by estimation, is 60 to 65%. The left ventricle has normal function. The left ventricle has no regional wall motion abnormalities. The left ventricular internal cavity size was normal in size. There is no left ventricular hypertrophy. Left ventricular diastolic parameters are indeterminate.  Right Ventricle: The right ventricular size is normal. No increase in right ventricular wall thickness. Right ventricular systolic function is normal.  Left Atrium: Left atrial size was moderately dilated.  Right Atrium: Right atrial size was normal in size.  Pericardium: There is no evidence of pericardial effusion.  Mitral Valve: The mitral valve is normal in structure. No evidence of mitral valve regurgitation. No evidence of mitral valve stenosis.  Tricuspid Valve: The tricuspid valve is normal in structure. Tricuspid valve regurgitation is not demonstrated. No evidence of tricuspid stenosis.  Aortic Valve: The aortic valve is tricuspid. There is mild calcification of the aortic valve. Aortic valve regurgitation is not visualized. Aortic valve sclerosis is present, with no evidence of aortic valve stenosis.  Pulmonic Valve: The pulmonic valve was normal in structure. Pulmonic valve regurgitation is trivial. No evidence of pulmonic stenosis.  Aorta: The aortic root is normal in size and structure. There is borderline dilatation of the ascending aorta, measuring 39 mm.  Venous: The inferior vena cava is normal in size with greater than 50% respiratory variability, suggesting right atrial pressure of 3 mmHg.  IAS/Shunts: No atrial level shunt detected by color flow  Doppler.   LEFT VENTRICLE PLAX 2D LVIDd:         4.90 cm LVIDs:         3.40 cm LV PW:         1.50 cm LV IVS:        1.20 cm LVOT diam:     2.20 cm LV SV:         65 LV SV Index:   31 LVOT Area:     3.80 cm   RIGHT VENTRICLE RV Basal diam:  3.00 cm RV S prime:     10.19 cm/s TAPSE (M-mode): 1.0 cm  LEFT ATRIUM              Index        RIGHT ATRIUM           Index LA diam:        4.80 cm  2.33 cm/m   RA Area:     16.00 cm LA Vol (A2C):   65.8 ml  31.89 ml/m  RA Volume:   32.60 ml  15.80 ml/m LA Vol (A4C):   128.0 ml 62.04 ml/m LA Biplane Vol: 92.6 ml  44.88 ml/m AORTIC VALVE LVOT Vmax:   95.27 cm/s LVOT Vmean:  62.167 cm/s LVOT VTI:    0.170 m  AORTA Ao Root diam: 3.80 cm Ao Asc diam:  3.90 cm   SHUNTS Systemic VTI:  0.17 m Systemic Diam: 2.20 cm  Donato Schultz MD Electronically signed by Donato Schultz  MD Signature Date/Time: 05/06/2023/2:27:18 PM    Final  TEE  ECHO TEE 04/14/2023  Narrative TRANSESOPHOGEAL ECHO REPORT    Patient Name:   EMON FACIANE Darrah Date of Exam: 04/14/2023 Medical Rec #:  [id number]        Height:       71.0 in Accession #:    [id number]       Weight:       190.0 lb Date of Birth:  30-Nov-1950        BSA:          2.063 m Patient Age:    72 years         BP:           98/49 mmHg Patient Gender: M                HR:           58 bpm. Exam Location:  Inpatient  Procedure: Color Doppler and Transesophageal Echo  Indications:    Post Watchman LAA occlusion device  History:        Patient has prior history of Echocardiogram examinations.  Sonographer:    Milda Smart Referring Phys: [id number] Parke Poisson   Sonographer Comments: TEE probe [phone]   PROCEDURE: After discussion of the risks and benefits of a TEE, an informed consent was obtained from the patient. TEE procedure time was 6 minutes. The transesophogeal probe was passed without difficulty through the esophogus of the patient. Imaged were obtained with the  patient in a left lateral decubitus position. Local oropharyngeal anesthetic was provided with Cetacaine. Sedation performed by different physician. The patient was monitored while under deep sedation. Anesthestetic sedation was provided intravenously by Anesthesiology: 186.2mg  of Propofol, 100mg  of Lidocaine. Image quality was good. The patient's vital signs; including heart rate, blood pressure, and oxygen saturation; remained stable throughout the procedure. The patient developed no complications during the procedure.  IMPRESSIONS   1. 24 mm Watchman FLX device well seated in left atrial appendage. No peridevice leak. No adherent thrombus. Left atrial size was moderately dilated. 2. Left ventricular ejection fraction, by estimation, is 60 to 65%. The left ventricle has normal function. 3. Right ventricular systolic function is low normal. The right ventricular size is normal. 4. Right atrial size was moderately dilated. 5. The mitral valve is normal in structure. 6. The aortic valve is tricuspid. There is mild calcification of the aortic valve. 7. There is mild (Grade II) plaque involving the descending aorta.  FINDINGS Left Ventricle: Left ventricular ejection fraction, by estimation, is 60 to 65%. The left ventricle has normal function. The left ventricular internal cavity size was normal in size.  Right Ventricle: The right ventricular size is normal. No increase in right ventricular wall thickness. Right ventricular systolic function is low normal.  Left Atrium: 24 mm Watchman FLX device well seated in left atrial appendage. No peridevice leak. No adherent thrombus. Left atrial size was moderately dilated.  Right Atrium: Right atrial size was moderately dilated.  Pericardium: Trivial pericardial effusion is present.  Mitral Valve: The mitral valve is normal in structure.  Tricuspid Valve: The tricuspid valve is grossly normal.  Aortic Valve: The aortic valve is tricuspid. There  is mild calcification of the aortic valve.  Pulmonic Valve: The pulmonic valve was not assessed.  Aorta: The aortic root is normal in size and structure. There is mild (Grade II) plaque involving the descending aorta.    AORTA Ao Root diam: 3.90 cm  Weston Brass MD Electronically signed by Weston Brass MD Signature Date/Time: 04/14/2023/9:30:48 AM    Final            EKG:  EKG is not ordered today.    Recent Labs: 10/20/2022: ALT 26; TSH 2.660 03/29/2023: BUN 25; Creatinine, Ser 2.26; Hemoglobin 12.1; Platelets 190; Potassium 4.2; Sodium 140   Recent Lipid Panel No results found for: "CHOL", "TRIG", "HDL", "CHOLHDL", "VLDL", "LDLCALC", "LDLDIRECT"  Physical Exam:    VS:  BP (!) 140/90 (BP Location: Left Arm, Patient Position: Sitting, Cuff Size: Small)   Pulse 65   Resp 16   Ht 5\' 11"  (1.803 m)   Wt 212 lb (96.2 kg)   SpO2 95%   BMI 29.57 kg/m     Wt Readings from Last 3 Encounters:  07/28/23 212 lb (96.2 kg)  04/14/23 190 lb (86.2 kg)  03/10/23 198 lb 12.8 oz (90.2 kg)    General: Well developed, well nourished, NAD Lungs:Clear to ausculation bilaterally. No wheezes, rales, or rhonchi. Breathing is unlabored. Cardiovascular: RRR with S1 S2. No murmur Extremities: No edema. Neuro: Alert and oriented. No focal deficits. No facial asymmetry. MAE spontaneously. Psych: Responds to questions appropriately with normal affect.    ASSESSMENT/PLAN:    Persistent atrial fibrillation: s/p LAAO with 24mm Watchman FLX Pro device on 02/03/23. Post implant TEE with no leak or thrombus. He is now 6 months post implant and may stop Plavix and dental SBE. No need for ASA with no CAD hx. Plan one year post implant follow up with our team and primary cardiology in the interm.    Chronic diastolic CHF: Patient self discontinued Lasix however appears stable with no volume overload on exam. Will take off medication list and discuss if needed in the future.    HTN: Continue  current regimen with well controlled home BPs on log today.    CKD stage IIIb: Stable Cr per lab review. No changes today.   I spent 20 minutes caring for this patient today including face-to-face discussions, ordering and reviewing labs, reviewing records from [facility] and other outside facilities, documenting in the record, and arranging for follow up.    Medication Adjustments/Labs and Tests Ordered: Current medicines are reviewed at length with the patient today.  Concerns regarding medicines are outlined above.  No orders of the defined types were placed in this encounter.  No orders of the defined types were placed in this encounter.   Patient Instructions  Medication Instructions:  Your physician has recommended you make the following change in your medication:  STOP AMOXICILLIN  STOP PLAVIX   STOP LASIX  *If you need a refill on your cardiac medications before your next appointment, please call your pharmacy*   Lab Work: NONE If you have labs (blood work) drawn today and your tests are completely normal, you will receive your results only by: MyChart Message (if you have MyChart) OR A paper copy in the mail If you have any lab test that is abnormal or we need to change your treatment, we will call you to review the results.   Testing/Procedures: NONE   Follow-Up: At [facility], you and your health needs are our priority.  As part of our continuing mission to provide you with exceptional heart care, we have created designated Provider Care Teams.  These Care Teams include your primary Cardiologist (physician) and Advanced Practice Providers (APPs -  Physician Assistants and Nurse Practitioners) who all work together to provide you with  the care you need, when you need it.  We recommend signing up for the patient portal called "MyChart".  Sign up information is provided on this After Visit Summary.  MyChart is used to connect with patients for Virtual Visits  (Telemedicine).  Patients are able to view lab/test results, encounter notes, upcoming appointments, etc.  Non-urgent messages can be sent to your provider as well.   To learn more about what you can do with MyChart, go to [email or web address].    Your next appointment:   1 year(s)  Provider:   Georgie Chard, NP           Signed, Georgie Chard, NP  07/28/2023 11:56 AM    [facility]

## 2023-07-28 ENCOUNTER — Other Ambulatory Visit: Payer: Self-pay | Admitting: Cardiology

## 2023-07-28 ENCOUNTER — Ambulatory Visit: Payer: Medicare Other | Attending: Cardiology | Admitting: Cardiology

## 2023-07-28 VITALS — BP 140/90 | HR 65 | Resp 16 | Ht 71.0 in | Wt 212.0 lb

## 2023-07-28 DIAGNOSIS — Z95818 Presence of other cardiac implants and grafts: Secondary | ICD-10-CM | POA: Diagnosis not present

## 2023-07-28 DIAGNOSIS — N1832 Chronic kidney disease, stage 3b: Secondary | ICD-10-CM

## 2023-07-28 DIAGNOSIS — I10 Essential (primary) hypertension: Secondary | ICD-10-CM | POA: Diagnosis not present

## 2023-07-28 DIAGNOSIS — I5032 Chronic diastolic (congestive) heart failure: Secondary | ICD-10-CM | POA: Diagnosis not present

## 2023-07-28 DIAGNOSIS — I480 Paroxysmal atrial fibrillation: Secondary | ICD-10-CM | POA: Diagnosis not present

## 2023-07-28 NOTE — Patient Instructions (Signed)
Medication Instructions:  Your physician has recommended you make the following change in your medication:  STOP AMOXICILLIN  STOP PLAVIX   STOP LASIX  *If you need a refill on your cardiac medications before your next appointment, please call your pharmacy*   Lab Work: NONE If you have labs (blood work) drawn today and your tests are completely normal, you will receive your results only by: MyChart Message (if you have MyChart) OR A paper copy in the mail If you have any lab test that is abnormal or we need to change your treatment, we will call you to review the results.   Testing/Procedures: NONE   Follow-Up: At [facility], you and your health needs are our priority.  As part of our continuing mission to provide you with exceptional heart care, we have created designated Provider Care Teams.  These Care Teams include your primary Cardiologist (physician) and Advanced Practice Providers (APPs -  Physician Assistants and Nurse Practitioners) who all work together to provide you with the care you need, when you need it.  We recommend signing up for the patient portal called "MyChart".  Sign up information is provided on this After Visit Summary.  MyChart is used to connect with patients for Virtual Visits (Telemedicine).  Patients are able to view lab/test results, encounter notes, upcoming appointments, etc.  Non-urgent messages can be sent to your provider as well.   To learn more about what you can do with MyChart, go to [email or web address].    Your next appointment:   1 year(s)  Provider:   Georgie Chard, NP

## 2023-08-24 DIAGNOSIS — Z79899 Other long term (current) drug therapy: Secondary | ICD-10-CM | POA: Diagnosis not present

## 2023-08-24 DIAGNOSIS — Z5181 Encounter for therapeutic drug level monitoring: Secondary | ICD-10-CM | POA: Diagnosis not present

## 2023-08-24 DIAGNOSIS — Z79891 Long term (current) use of opiate analgesic: Secondary | ICD-10-CM | POA: Diagnosis not present

## 2023-08-24 DIAGNOSIS — G894 Chronic pain syndrome: Secondary | ICD-10-CM | POA: Diagnosis not present

## 2023-09-09 ENCOUNTER — Other Ambulatory Visit: Payer: Self-pay | Admitting: Cardiology

## 2023-09-18 DIAGNOSIS — M25561 Pain in right knee: Secondary | ICD-10-CM | POA: Diagnosis not present

## 2023-09-18 DIAGNOSIS — M25571 Pain in right ankle and joints of right foot: Secondary | ICD-10-CM | POA: Diagnosis not present

## 2023-09-18 DIAGNOSIS — R2241 Localized swelling, mass and lump, right lower limb: Secondary | ICD-10-CM | POA: Diagnosis not present

## 2023-10-04 DIAGNOSIS — M25571 Pain in right ankle and joints of right foot: Secondary | ICD-10-CM | POA: Diagnosis not present

## 2023-10-04 DIAGNOSIS — M25561 Pain in right knee: Secondary | ICD-10-CM | POA: Diagnosis not present

## 2023-10-04 DIAGNOSIS — R600 Localized edema: Secondary | ICD-10-CM | POA: Diagnosis not present

## 2023-10-12 DIAGNOSIS — E785 Hyperlipidemia, unspecified: Secondary | ICD-10-CM | POA: Diagnosis not present

## 2023-10-13 DIAGNOSIS — E7849 Other hyperlipidemia: Secondary | ICD-10-CM | POA: Diagnosis not present

## 2023-10-13 DIAGNOSIS — R3129 Other microscopic hematuria: Secondary | ICD-10-CM | POA: Diagnosis not present

## 2023-10-20 DIAGNOSIS — M25571 Pain in right ankle and joints of right foot: Secondary | ICD-10-CM | POA: Diagnosis not present

## 2023-11-03 DIAGNOSIS — M25571 Pain in right ankle and joints of right foot: Secondary | ICD-10-CM | POA: Diagnosis not present

## 2023-11-03 DIAGNOSIS — Z1389 Encounter for screening for other disorder: Secondary | ICD-10-CM | POA: Diagnosis not present

## 2023-11-03 DIAGNOSIS — I5032 Chronic diastolic (congestive) heart failure: Secondary | ICD-10-CM | POA: Diagnosis not present

## 2023-11-03 DIAGNOSIS — Z1331 Encounter for screening for depression: Secondary | ICD-10-CM | POA: Diagnosis not present

## 2023-11-03 DIAGNOSIS — I10 Essential (primary) hypertension: Secondary | ICD-10-CM | POA: Diagnosis not present

## 2023-11-03 DIAGNOSIS — Z0000 Encounter for general adult medical examination without abnormal findings: Secondary | ICD-10-CM | POA: Diagnosis not present

## 2023-11-04 ENCOUNTER — Other Ambulatory Visit (HOSPITAL_BASED_OUTPATIENT_CLINIC_OR_DEPARTMENT_OTHER): Payer: Self-pay | Admitting: Internal Medicine

## 2023-11-04 DIAGNOSIS — Z0000 Encounter for general adult medical examination without abnormal findings: Secondary | ICD-10-CM

## 2023-11-17 DIAGNOSIS — M25571 Pain in right ankle and joints of right foot: Secondary | ICD-10-CM | POA: Diagnosis not present

## 2023-12-22 DIAGNOSIS — G894 Chronic pain syndrome: Secondary | ICD-10-CM | POA: Diagnosis not present

## 2023-12-22 DIAGNOSIS — Z79891 Long term (current) use of opiate analgesic: Secondary | ICD-10-CM | POA: Diagnosis not present

## 2023-12-22 DIAGNOSIS — Z5181 Encounter for therapeutic drug level monitoring: Secondary | ICD-10-CM | POA: Diagnosis not present

## 2023-12-22 DIAGNOSIS — Z79899 Other long term (current) drug therapy: Secondary | ICD-10-CM | POA: Diagnosis not present

## 2023-12-29 ENCOUNTER — Encounter: Payer: Self-pay | Admitting: Internal Medicine

## 2024-01-24 ENCOUNTER — Telehealth: Payer: Medicare Other

## 2024-01-26 ENCOUNTER — Telehealth: Payer: Self-pay

## 2024-01-26 NOTE — Telephone Encounter (Signed)
 Called to check in with patient, who had LAAO on 02/03/2023. The patient reports doing well with no issues.  Offered to schedule follow-up with Dr. Maximo Spar but the patient declined, stating he will call will call if he has any issues for an appointment. He was grateful for call and agreed with plan.

## 2024-03-15 DIAGNOSIS — G9009 Other idiopathic peripheral autonomic neuropathy: Secondary | ICD-10-CM | POA: Diagnosis not present

## 2024-03-15 DIAGNOSIS — M6281 Muscle weakness (generalized): Secondary | ICD-10-CM | POA: Diagnosis not present

## 2024-03-15 DIAGNOSIS — R2689 Other abnormalities of gait and mobility: Secondary | ICD-10-CM | POA: Diagnosis not present

## 2024-03-20 DIAGNOSIS — R2689 Other abnormalities of gait and mobility: Secondary | ICD-10-CM | POA: Diagnosis not present

## 2024-03-20 DIAGNOSIS — M6281 Muscle weakness (generalized): Secondary | ICD-10-CM | POA: Diagnosis not present

## 2024-03-20 DIAGNOSIS — G9009 Other idiopathic peripheral autonomic neuropathy: Secondary | ICD-10-CM | POA: Diagnosis not present

## 2024-03-23 DIAGNOSIS — R2689 Other abnormalities of gait and mobility: Secondary | ICD-10-CM | POA: Diagnosis not present

## 2024-03-23 DIAGNOSIS — M6281 Muscle weakness (generalized): Secondary | ICD-10-CM | POA: Diagnosis not present

## 2024-03-23 DIAGNOSIS — G9009 Other idiopathic peripheral autonomic neuropathy: Secondary | ICD-10-CM | POA: Diagnosis not present

## 2024-03-29 DIAGNOSIS — R2689 Other abnormalities of gait and mobility: Secondary | ICD-10-CM | POA: Diagnosis not present

## 2024-03-29 DIAGNOSIS — G9009 Other idiopathic peripheral autonomic neuropathy: Secondary | ICD-10-CM | POA: Diagnosis not present

## 2024-03-29 DIAGNOSIS — M6281 Muscle weakness (generalized): Secondary | ICD-10-CM | POA: Diagnosis not present

## 2024-04-03 DIAGNOSIS — R2689 Other abnormalities of gait and mobility: Secondary | ICD-10-CM | POA: Diagnosis not present

## 2024-04-03 DIAGNOSIS — G9009 Other idiopathic peripheral autonomic neuropathy: Secondary | ICD-10-CM | POA: Diagnosis not present

## 2024-04-03 DIAGNOSIS — M6281 Muscle weakness (generalized): Secondary | ICD-10-CM | POA: Diagnosis not present

## 2024-04-07 DIAGNOSIS — R2689 Other abnormalities of gait and mobility: Secondary | ICD-10-CM | POA: Diagnosis not present

## 2024-04-07 DIAGNOSIS — G9009 Other idiopathic peripheral autonomic neuropathy: Secondary | ICD-10-CM | POA: Diagnosis not present

## 2024-04-07 DIAGNOSIS — M6281 Muscle weakness (generalized): Secondary | ICD-10-CM | POA: Diagnosis not present

## 2024-04-11 DIAGNOSIS — G9009 Other idiopathic peripheral autonomic neuropathy: Secondary | ICD-10-CM | POA: Diagnosis not present

## 2024-04-11 DIAGNOSIS — M6281 Muscle weakness (generalized): Secondary | ICD-10-CM | POA: Diagnosis not present

## 2024-04-11 DIAGNOSIS — R2689 Other abnormalities of gait and mobility: Secondary | ICD-10-CM | POA: Diagnosis not present

## 2024-04-24 DIAGNOSIS — G894 Chronic pain syndrome: Secondary | ICD-10-CM | POA: Diagnosis not present

## 2024-04-24 DIAGNOSIS — Z79891 Long term (current) use of opiate analgesic: Secondary | ICD-10-CM | POA: Diagnosis not present

## 2024-04-24 DIAGNOSIS — G5793 Unspecified mononeuropathy of bilateral lower limbs: Secondary | ICD-10-CM | POA: Diagnosis not present

## 2024-04-24 DIAGNOSIS — M961 Postlaminectomy syndrome, not elsewhere classified: Secondary | ICD-10-CM | POA: Diagnosis not present

## 2024-04-26 DIAGNOSIS — G9009 Other idiopathic peripheral autonomic neuropathy: Secondary | ICD-10-CM | POA: Diagnosis not present

## 2024-04-26 DIAGNOSIS — M6281 Muscle weakness (generalized): Secondary | ICD-10-CM | POA: Diagnosis not present

## 2024-04-26 DIAGNOSIS — R2689 Other abnormalities of gait and mobility: Secondary | ICD-10-CM | POA: Diagnosis not present

## 2024-05-03 DIAGNOSIS — G9009 Other idiopathic peripheral autonomic neuropathy: Secondary | ICD-10-CM | POA: Diagnosis not present

## 2024-05-03 DIAGNOSIS — M6281 Muscle weakness (generalized): Secondary | ICD-10-CM | POA: Diagnosis not present

## 2024-05-03 DIAGNOSIS — R2689 Other abnormalities of gait and mobility: Secondary | ICD-10-CM | POA: Diagnosis not present

## 2024-05-04 DIAGNOSIS — M6281 Muscle weakness (generalized): Secondary | ICD-10-CM | POA: Diagnosis not present

## 2024-05-04 DIAGNOSIS — R2689 Other abnormalities of gait and mobility: Secondary | ICD-10-CM | POA: Diagnosis not present

## 2024-05-04 DIAGNOSIS — G9009 Other idiopathic peripheral autonomic neuropathy: Secondary | ICD-10-CM | POA: Diagnosis not present

## 2024-05-10 DIAGNOSIS — M6281 Muscle weakness (generalized): Secondary | ICD-10-CM | POA: Diagnosis not present

## 2024-05-10 DIAGNOSIS — R2689 Other abnormalities of gait and mobility: Secondary | ICD-10-CM | POA: Diagnosis not present

## 2024-05-10 DIAGNOSIS — G9009 Other idiopathic peripheral autonomic neuropathy: Secondary | ICD-10-CM | POA: Diagnosis not present

## 2024-05-12 DIAGNOSIS — R2689 Other abnormalities of gait and mobility: Secondary | ICD-10-CM | POA: Diagnosis not present

## 2024-05-12 DIAGNOSIS — M6281 Muscle weakness (generalized): Secondary | ICD-10-CM | POA: Diagnosis not present

## 2024-05-12 DIAGNOSIS — G9009 Other idiopathic peripheral autonomic neuropathy: Secondary | ICD-10-CM | POA: Diagnosis not present

## 2024-05-31 DIAGNOSIS — M6281 Muscle weakness (generalized): Secondary | ICD-10-CM | POA: Diagnosis not present

## 2024-05-31 DIAGNOSIS — R2689 Other abnormalities of gait and mobility: Secondary | ICD-10-CM | POA: Diagnosis not present

## 2024-05-31 DIAGNOSIS — G9009 Other idiopathic peripheral autonomic neuropathy: Secondary | ICD-10-CM | POA: Diagnosis not present

## 2024-06-02 DIAGNOSIS — M6281 Muscle weakness (generalized): Secondary | ICD-10-CM | POA: Diagnosis not present

## 2024-06-02 DIAGNOSIS — G9009 Other idiopathic peripheral autonomic neuropathy: Secondary | ICD-10-CM | POA: Diagnosis not present

## 2024-06-02 DIAGNOSIS — R2689 Other abnormalities of gait and mobility: Secondary | ICD-10-CM | POA: Diagnosis not present

## 2024-06-08 DIAGNOSIS — M6281 Muscle weakness (generalized): Secondary | ICD-10-CM | POA: Diagnosis not present

## 2024-06-08 DIAGNOSIS — G9009 Other idiopathic peripheral autonomic neuropathy: Secondary | ICD-10-CM | POA: Diagnosis not present

## 2024-06-08 DIAGNOSIS — R2689 Other abnormalities of gait and mobility: Secondary | ICD-10-CM | POA: Diagnosis not present

## 2024-06-20 ENCOUNTER — Telehealth: Payer: Self-pay | Admitting: Internal Medicine

## 2024-06-20 DIAGNOSIS — R7989 Other specified abnormal findings of blood chemistry: Secondary | ICD-10-CM | POA: Diagnosis not present

## 2024-06-20 DIAGNOSIS — I10 Essential (primary) hypertension: Secondary | ICD-10-CM | POA: Diagnosis not present

## 2024-06-20 DIAGNOSIS — I4891 Unspecified atrial fibrillation: Secondary | ICD-10-CM

## 2024-06-20 MED ORDER — METOPROLOL SUCCINATE ER 100 MG PO TB24: 100.0000 mg | ORAL_TABLET | Freq: Every day | ORAL | 1 refills | Status: DC

## 2024-06-20 NOTE — Telephone Encounter (Signed)
*  STAT* If patient is at the pharmacy, call can be transferred to refill team.   1. Which medications need to be refilled? (please list name of each medication and dose if known) metoprolol  succinate (TOPROL -XL) 100 MG 24 hr tablet    2. Would you like to learn more about the convenience, safety, & potential cost savings by using the [facility]? No    3. Are you open to using the [facility] (Type [facility]. No    4. Which pharmacy/location (including street and city if local pharmacy) is medication to be sent to? [id number] [place], [place] - [address]     5. Do they need a 30 day or 90 day supply? 90 day    Pt is out of medication.

## 2024-06-20 NOTE — Telephone Encounter (Signed)
 Refill sent.

## 2024-06-20 NOTE — Telephone Encounter (Signed)
*  STAT* If patient is at the pharmacy, call can be transferred to refill team.   1. Which medications need to be refilled? (please list name of each medication and dose if known) hydrALAZINE  (APRESOLINE ) 50 MG tablet   2. Which pharmacy/location (including street and city if local pharmacy) is medication to be sent to?  [id number] [place], [place] - [address]    3. Do they need a 30 day or 90 day supply? 90

## 2024-06-21 MED ORDER — HYDRALAZINE HCL 50 MG PO TABS: 50.0000 mg | ORAL_TABLET | Freq: Three times a day (TID) | ORAL | 1 refills | Status: DC

## 2024-06-21 NOTE — Telephone Encounter (Signed)
 Refill sent.

## 2024-06-22 DIAGNOSIS — M6281 Muscle weakness (generalized): Secondary | ICD-10-CM | POA: Diagnosis not present

## 2024-06-22 DIAGNOSIS — G9009 Other idiopathic peripheral autonomic neuropathy: Secondary | ICD-10-CM | POA: Diagnosis not present

## 2024-06-22 DIAGNOSIS — R2689 Other abnormalities of gait and mobility: Secondary | ICD-10-CM | POA: Diagnosis not present

## 2024-06-26 ENCOUNTER — Other Ambulatory Visit (HOSPITAL_COMMUNITY): Payer: Self-pay | Admitting: Internal Medicine

## 2024-06-26 DIAGNOSIS — R7989 Other specified abnormal findings of blood chemistry: Secondary | ICD-10-CM

## 2024-07-03 DIAGNOSIS — G9009 Other idiopathic peripheral autonomic neuropathy: Secondary | ICD-10-CM | POA: Diagnosis not present

## 2024-07-03 DIAGNOSIS — M6281 Muscle weakness (generalized): Secondary | ICD-10-CM | POA: Diagnosis not present

## 2024-07-03 DIAGNOSIS — Z1389 Encounter for screening for other disorder: Secondary | ICD-10-CM | POA: Diagnosis not present

## 2024-07-03 DIAGNOSIS — R2689 Other abnormalities of gait and mobility: Secondary | ICD-10-CM | POA: Diagnosis not present

## 2024-07-05 DIAGNOSIS — R2689 Other abnormalities of gait and mobility: Secondary | ICD-10-CM | POA: Diagnosis not present

## 2024-07-05 DIAGNOSIS — M6281 Muscle weakness (generalized): Secondary | ICD-10-CM | POA: Diagnosis not present

## 2024-07-05 DIAGNOSIS — G9009 Other idiopathic peripheral autonomic neuropathy: Secondary | ICD-10-CM | POA: Diagnosis not present

## 2024-07-16 ENCOUNTER — Other Ambulatory Visit: Payer: Self-pay | Admitting: Internal Medicine

## 2024-07-16 DIAGNOSIS — I4891 Unspecified atrial fibrillation: Secondary | ICD-10-CM

## 2024-07-18 DIAGNOSIS — R2689 Other abnormalities of gait and mobility: Secondary | ICD-10-CM | POA: Diagnosis not present

## 2024-07-18 DIAGNOSIS — G9009 Other idiopathic peripheral autonomic neuropathy: Secondary | ICD-10-CM | POA: Diagnosis not present

## 2024-07-18 DIAGNOSIS — M6281 Muscle weakness (generalized): Secondary | ICD-10-CM | POA: Diagnosis not present

## 2024-07-20 DIAGNOSIS — R2689 Other abnormalities of gait and mobility: Secondary | ICD-10-CM | POA: Diagnosis not present

## 2024-07-20 DIAGNOSIS — G9009 Other idiopathic peripheral autonomic neuropathy: Secondary | ICD-10-CM | POA: Diagnosis not present

## 2024-07-20 DIAGNOSIS — M6281 Muscle weakness (generalized): Secondary | ICD-10-CM | POA: Diagnosis not present

## 2024-08-19 ENCOUNTER — Other Ambulatory Visit: Payer: Self-pay | Admitting: Internal Medicine

## 2024-08-19 DIAGNOSIS — I4891 Unspecified atrial fibrillation: Secondary | ICD-10-CM
